# Patient Record
Sex: Female | Born: 1935 | Race: White | Hispanic: No | Smoking: Never smoker
Health system: Southern US, Community
[De-identification: ages and names within clinical notes are randomized; demographics above are authoritative.]

## PROBLEM LIST (undated history)

## (undated) DIAGNOSIS — C801 Malignant (primary) neoplasm, unspecified: Secondary | ICD-10-CM

## (undated) DIAGNOSIS — I10 Essential (primary) hypertension: Secondary | ICD-10-CM

## (undated) HISTORY — PX: ABDOMINAL HYSTERECTOMY: SHX81

---

## 2003-12-26 ENCOUNTER — Ambulatory Visit: Payer: Self-pay | Admitting: Pain Medicine

## 2004-01-03 ENCOUNTER — Ambulatory Visit: Payer: Self-pay | Admitting: Pain Medicine

## 2004-01-13 ENCOUNTER — Ambulatory Visit: Payer: Self-pay | Admitting: Pain Medicine

## 2004-02-13 ENCOUNTER — Ambulatory Visit: Payer: Self-pay | Admitting: Pain Medicine

## 2004-03-26 ENCOUNTER — Inpatient Hospital Stay: Payer: Self-pay | Admitting: Internal Medicine

## 2004-03-26 ENCOUNTER — Other Ambulatory Visit: Payer: Self-pay

## 2004-05-18 ENCOUNTER — Encounter: Admission: RE | Admit: 2004-05-18 | Discharge: 2004-05-18 | Payer: Self-pay | Admitting: *Deleted

## 2004-08-06 ENCOUNTER — Ambulatory Visit: Payer: Self-pay | Admitting: Pain Medicine

## 2004-08-12 ENCOUNTER — Ambulatory Visit: Payer: Self-pay | Admitting: Pain Medicine

## 2005-12-29 ENCOUNTER — Ambulatory Visit: Payer: Self-pay | Admitting: Pain Medicine

## 2006-01-03 ENCOUNTER — Ambulatory Visit: Payer: Self-pay | Admitting: Pain Medicine

## 2006-01-04 ENCOUNTER — Ambulatory Visit: Payer: Self-pay | Admitting: Pain Medicine

## 2006-05-09 ENCOUNTER — Other Ambulatory Visit: Payer: Self-pay

## 2006-05-09 ENCOUNTER — Ambulatory Visit: Payer: Self-pay | Admitting: General Practice

## 2006-05-16 ENCOUNTER — Ambulatory Visit: Payer: Self-pay | Admitting: General Practice

## 2007-03-01 ENCOUNTER — Ambulatory Visit: Payer: Self-pay | Admitting: Unknown Physician Specialty

## 2007-11-20 ENCOUNTER — Other Ambulatory Visit: Payer: Self-pay

## 2007-11-20 ENCOUNTER — Ambulatory Visit: Payer: Self-pay | Admitting: General Practice

## 2007-12-04 ENCOUNTER — Inpatient Hospital Stay: Payer: Self-pay | Admitting: General Practice

## 2007-12-08 ENCOUNTER — Encounter: Payer: Self-pay | Admitting: Internal Medicine

## 2007-12-17 ENCOUNTER — Encounter: Payer: Self-pay | Admitting: Internal Medicine

## 2008-08-13 ENCOUNTER — Ambulatory Visit: Payer: Self-pay | Admitting: Unknown Physician Specialty

## 2008-08-14 ENCOUNTER — Ambulatory Visit: Payer: Self-pay | Admitting: Pain Medicine

## 2008-08-21 ENCOUNTER — Ambulatory Visit: Payer: Self-pay | Admitting: Pain Medicine

## 2008-09-03 ENCOUNTER — Ambulatory Visit: Payer: Self-pay | Admitting: Pain Medicine

## 2008-10-15 ENCOUNTER — Ambulatory Visit: Payer: Self-pay | Admitting: Pain Medicine

## 2008-10-23 ENCOUNTER — Ambulatory Visit: Payer: Self-pay | Admitting: Pain Medicine

## 2008-11-28 ENCOUNTER — Ambulatory Visit: Payer: Self-pay | Admitting: Pain Medicine

## 2008-12-02 ENCOUNTER — Ambulatory Visit: Payer: Self-pay | Admitting: Pain Medicine

## 2008-12-31 ENCOUNTER — Ambulatory Visit: Payer: Self-pay | Admitting: Pain Medicine

## 2009-02-11 ENCOUNTER — Ambulatory Visit: Payer: Self-pay | Admitting: Pain Medicine

## 2009-02-19 ENCOUNTER — Ambulatory Visit: Payer: Self-pay | Admitting: Pain Medicine

## 2009-03-27 ENCOUNTER — Ambulatory Visit: Payer: Self-pay | Admitting: Pain Medicine

## 2009-04-07 ENCOUNTER — Ambulatory Visit: Payer: Self-pay | Admitting: Pain Medicine

## 2009-06-10 ENCOUNTER — Ambulatory Visit: Payer: Self-pay | Admitting: Pain Medicine

## 2009-07-29 ENCOUNTER — Ambulatory Visit: Payer: Self-pay | Admitting: General Practice

## 2009-08-11 ENCOUNTER — Inpatient Hospital Stay: Payer: Self-pay | Admitting: General Practice

## 2009-09-15 ENCOUNTER — Ambulatory Visit: Payer: Self-pay | Admitting: Unknown Physician Specialty

## 2009-12-03 ENCOUNTER — Ambulatory Visit: Payer: Self-pay | Admitting: Pain Medicine

## 2009-12-08 ENCOUNTER — Ambulatory Visit: Payer: Self-pay | Admitting: Pain Medicine

## 2009-12-12 ENCOUNTER — Ambulatory Visit: Payer: Self-pay | Admitting: Pain Medicine

## 2010-01-06 ENCOUNTER — Ambulatory Visit: Payer: Self-pay | Admitting: Pain Medicine

## 2010-01-12 ENCOUNTER — Ambulatory Visit: Payer: Self-pay | Admitting: Pain Medicine

## 2010-01-21 ENCOUNTER — Ambulatory Visit: Payer: Self-pay | Admitting: Pain Medicine

## 2010-02-12 ENCOUNTER — Ambulatory Visit: Payer: Self-pay | Admitting: Pain Medicine

## 2010-03-17 ENCOUNTER — Ambulatory Visit: Payer: Self-pay | Admitting: Pain Medicine

## 2010-04-16 ENCOUNTER — Ambulatory Visit: Payer: Self-pay | Admitting: Pain Medicine

## 2010-04-30 ENCOUNTER — Ambulatory Visit: Payer: Self-pay | Admitting: Family Medicine

## 2010-10-14 ENCOUNTER — Ambulatory Visit: Payer: Self-pay | Admitting: Unknown Physician Specialty

## 2011-10-19 ENCOUNTER — Ambulatory Visit: Payer: Self-pay | Admitting: Unknown Physician Specialty

## 2012-11-14 ENCOUNTER — Ambulatory Visit: Payer: Self-pay | Admitting: Ophthalmology

## 2012-12-26 ENCOUNTER — Ambulatory Visit: Payer: Self-pay | Admitting: Internal Medicine

## 2013-01-03 ENCOUNTER — Ambulatory Visit: Payer: Self-pay | Admitting: Internal Medicine

## 2013-08-02 DIAGNOSIS — E039 Hypothyroidism, unspecified: Secondary | ICD-10-CM | POA: Insufficient documentation

## 2013-09-07 DIAGNOSIS — M5136 Other intervertebral disc degeneration, lumbar region: Secondary | ICD-10-CM | POA: Insufficient documentation

## 2013-09-07 DIAGNOSIS — M5416 Radiculopathy, lumbar region: Secondary | ICD-10-CM | POA: Insufficient documentation

## 2013-12-27 ENCOUNTER — Ambulatory Visit: Payer: Self-pay | Admitting: Internal Medicine

## 2014-06-04 DIAGNOSIS — N183 Chronic kidney disease, stage 3 unspecified: Secondary | ICD-10-CM | POA: Insufficient documentation

## 2014-06-04 DIAGNOSIS — J3089 Other allergic rhinitis: Secondary | ICD-10-CM | POA: Insufficient documentation

## 2014-06-04 DIAGNOSIS — E782 Mixed hyperlipidemia: Secondary | ICD-10-CM | POA: Insufficient documentation

## 2014-06-04 DIAGNOSIS — I10 Essential (primary) hypertension: Secondary | ICD-10-CM | POA: Insufficient documentation

## 2014-08-14 ENCOUNTER — Other Ambulatory Visit: Payer: Self-pay | Admitting: Family Medicine

## 2014-08-14 ENCOUNTER — Ambulatory Visit
Admission: RE | Admit: 2014-08-14 | Discharge: 2014-08-14 | Disposition: A | Discharge status: Home / Self Care | Payer: Medicare Other | Source: Ambulatory Visit | Attending: Family Medicine | Admitting: Family Medicine

## 2014-08-14 ENCOUNTER — Ambulatory Visit
Admission: RE | Admit: 2014-08-14 | Discharge: 2014-08-14 | Disposition: A | Discharge status: Home / Self Care | Payer: Medicare Other | Source: Ambulatory Visit | Attending: Internal Medicine | Admitting: Internal Medicine

## 2014-08-14 DIAGNOSIS — R0602 Shortness of breath: Secondary | ICD-10-CM

## 2014-08-14 DIAGNOSIS — R059 Cough, unspecified: Secondary | ICD-10-CM

## 2014-08-14 DIAGNOSIS — K449 Diaphragmatic hernia without obstruction or gangrene: Secondary | ICD-10-CM | POA: Insufficient documentation

## 2014-08-14 DIAGNOSIS — R05 Cough: Secondary | ICD-10-CM | POA: Diagnosis present

## 2015-01-10 DIAGNOSIS — M5442 Lumbago with sciatica, left side: Secondary | ICD-10-CM

## 2015-01-10 DIAGNOSIS — M5441 Lumbago with sciatica, right side: Secondary | ICD-10-CM

## 2015-01-10 DIAGNOSIS — G8929 Other chronic pain: Secondary | ICD-10-CM | POA: Insufficient documentation

## 2015-06-11 DIAGNOSIS — M2550 Pain in unspecified joint: Secondary | ICD-10-CM | POA: Insufficient documentation

## 2015-06-11 DIAGNOSIS — N183 Chronic kidney disease, stage 3 unspecified: Secondary | ICD-10-CM | POA: Insufficient documentation

## 2015-06-11 DIAGNOSIS — M791 Myalgia, unspecified site: Secondary | ICD-10-CM | POA: Insufficient documentation

## 2015-08-04 DIAGNOSIS — M4800 Spinal stenosis, site unspecified: Secondary | ICD-10-CM | POA: Insufficient documentation

## 2016-03-17 ENCOUNTER — Emergency Department
Admission: EM | Admit: 2016-03-17 | Discharge: 2016-03-17 | Disposition: A | Discharge status: Home / Self Care | Payer: Medicare Other | Attending: Emergency Medicine | Admitting: Emergency Medicine

## 2016-03-17 ENCOUNTER — Emergency Department: Payer: Medicare Other

## 2016-03-17 ENCOUNTER — Encounter: Payer: Self-pay | Admitting: Emergency Medicine

## 2016-03-17 DIAGNOSIS — J351 Hypertrophy of tonsils: Secondary | ICD-10-CM | POA: Insufficient documentation

## 2016-03-17 DIAGNOSIS — I10 Essential (primary) hypertension: Secondary | ICD-10-CM | POA: Insufficient documentation

## 2016-03-17 DIAGNOSIS — R220 Localized swelling, mass and lump, head: Secondary | ICD-10-CM | POA: Diagnosis present

## 2016-03-17 HISTORY — DX: Malignant (primary) neoplasm, unspecified: C80.1

## 2016-03-17 HISTORY — DX: Essential (primary) hypertension: I10

## 2016-03-17 LAB — CBC
HCT: 45.4 % (ref 35.0–47.0)
Hemoglobin: 15.5 g/dL (ref 12.0–16.0)
MCH: 29.8 pg (ref 26.0–34.0)
MCHC: 34 g/dL (ref 32.0–36.0)
MCV: 87.6 fL (ref 80.0–100.0)
PLATELETS: 314 10*3/uL (ref 150–440)
RBC: 5.19 MIL/uL (ref 3.80–5.20)
RDW: 14.4 % (ref 11.5–14.5)
WBC: 9.2 10*3/uL (ref 3.6–11.0)

## 2016-03-17 LAB — COMPREHENSIVE METABOLIC PANEL
ALBUMIN: 4.5 g/dL (ref 3.5–5.0)
ALT: 16 U/L (ref 14–54)
ANION GAP: 12 (ref 5–15)
AST: 29 U/L (ref 15–41)
Alkaline Phosphatase: 53 U/L (ref 38–126)
BUN: 15 mg/dL (ref 6–20)
CHLORIDE: 102 mmol/L (ref 101–111)
CO2: 21 mmol/L — AB (ref 22–32)
Calcium: 9.9 mg/dL (ref 8.9–10.3)
Creatinine, Ser: 1.01 mg/dL — ABNORMAL HIGH (ref 0.44–1.00)
GFR calc non Af Amer: 50 mL/min — ABNORMAL LOW (ref >60)
GFR, EST AFRICAN AMERICAN: 58 mL/min — AB (ref >60)
GLUCOSE: 159 mg/dL — AB (ref 65–99)
Potassium: 4 mmol/L (ref 3.5–5.1)
SODIUM: 135 mmol/L (ref 135–145)
Total Bilirubin: 1 mg/dL (ref 0.3–1.2)
Total Protein: 7.9 g/dL (ref 6.5–8.1)

## 2016-03-17 MED ORDER — ONDANSETRON HCL 4 MG/2ML IJ SOLN
4.0000 mg | Freq: Once | INTRAMUSCULAR | Status: AC
  Administered 2016-03-17: 4 mg via INTRAVENOUS
  Filled 2016-03-17: qty 2

## 2016-03-17 MED ORDER — METHYLPREDNISOLONE SODIUM SUCC 125 MG IJ SOLR
125.0000 mg | Freq: Once | INTRAMUSCULAR | Status: AC
  Administered 2016-03-17: 125 mg via INTRAVENOUS
  Filled 2016-03-17: qty 2

## 2016-03-17 MED ORDER — DIPHENHYDRAMINE HCL 50 MG/ML IJ SOLN
25.0000 mg | Freq: Once | INTRAMUSCULAR | Status: AC
Start: 2016-03-17 — End: 2016-03-17
  Administered 2016-03-17: 25 mg via INTRAVENOUS
  Filled 2016-03-17: qty 1

## 2016-03-17 MED ORDER — LORAZEPAM 2 MG/ML IJ SOLN
0.5000 mg | Freq: Once | INTRAMUSCULAR | Status: AC
  Administered 2016-03-17: 0.5 mg via INTRAVENOUS

## 2016-03-17 MED ORDER — LORAZEPAM 2 MG/ML IJ SOLN
INTRAMUSCULAR | Status: AC
  Filled 2016-03-17: qty 1

## 2016-03-17 MED ORDER — IOPAMIDOL (ISOVUE-300) INJECTION 61%
75.0000 mL | Freq: Once | INTRAVENOUS | Status: AC | PRN
  Administered 2016-03-17: 75 mL via INTRAVENOUS

## 2016-03-17 NOTE — ED Notes (Signed)
Pt assisted to toilet to urinate 

## 2016-03-17 NOTE — ED Provider Notes (Signed)
Study Result   CLINICAL DATA:  Pain, tingling, and numbness of the lips for several days. Lip, tongue, and submandibular swelling.  EXAM: CT NECK WITH CONTRAST  TECHNIQUE: Multidetector CT imaging of the neck was performed using the standard protocol following the bolus administration of intravenous contrast.  CONTRAST:  90mL ISOVUE-300 IOPAMIDOL (ISOVUE-300) INJECTION 61%  COMPARISON:  None.  FINDINGS: Pharynx and larynx: Metallic dental streak artifact limits evaluation of the oral cavity, including much of the oral tongue as well as lips. No mass or significant swelling is seen in the floor of mouth. There is a 12 x 8 mm mildly nodular soft tissue focus at the right base of tongue projecting into the vallecula. This demonstrates similar enhancement compared to the contralateral lingual tonsil but is asymmetrically enlarged.  Salivary glands: No inflammation, mass, or stone.  Thyroid: Unremarkable.  Lymph nodes: No enlarged lymph nodes are identified in the neck.  Vascular: Major vascular structures of the neck appear patent. Mild-to-moderate calcified plaque is present in the aortic arch. There is a partially retropharyngeal course of the proximal right ICA.  Limited intracranial: Unremarkable.  Visualized orbits: Prior bilateral cataract extraction.  Mastoids and visualized paranasal sinuses: Clear.  Skeleton: Moderate cervical spondylosis. Diffuse cervical facet arthrosis, advanced on the left from C3-C6 with associated trace anterolisthesis at these levels.  Upper chest: Minimal pleuroparenchymal scarring in the lung apices.  Other: None.  IMPRESSION: 1. No acute abnormality identified in the neck. 2. 12 mm soft tissue focus in the right vallecula. This may reflect asymmetric lingual tonsil, however direct visualization is suggested to exclude neoplasm.   Electronically Signed   By: Logan Bores M.D.   On: 03/17/2016 08:22     Discussed with Dr. Richardson Landry he will follow-up in the office. Patient is aware that she has seen Dr. Kristine Garbe in the past she is very anxious and has a lot of stress in her life her husband recently went to the nursing home her daughter in law and believe actually has just been diagnosed with stomach cancer no bloody at home at present. She complains of tingling last night in her hands and feet and around her lips. This possibly could be due to tachypnea. She is not having anything right now except for some pain in the lips. Her lips appear to be normal.   Nena Polio, MD 03/17/16 1009

## 2016-03-17 NOTE — ED Notes (Signed)
Pt was assisted to the restroom and dressed for discharge.

## 2016-03-17 NOTE — ED Triage Notes (Signed)
Pt arrived via ems from home with complaints of nausea and diarrhea. Pt reports 1 episode of diarrhea. Pt denies any abdominal pain or chest pain. Pt also reports pain, tingling, and numbness of the lips for "several days." Pt able to communicate and answer questions appropriately. MD at bedside.

## 2016-03-17 NOTE — ED Notes (Signed)
Pt called her friend to pick her up.

## 2016-03-17 NOTE — Discharge Instructions (Signed)
Please call Dr. Richardson Landry or Dr. Oneida Alar and have them evaluate you. They should be ACCESSED the CAT scan report without any difficulty. That shows an enlarged tonsil with the base of her tongue. He checked. Please also follow-up with your regular family doctor. She can help with the tingling which are experiencing.

## 2016-03-17 NOTE — Care Management Note (Signed)
Case Management Note  Patient Details  Name: Alexandria Roman MRN: [id number] Date of Birth: 07-14-1929  Subjective/Objective:     Spoke to patient per MD. Provided her with the [facility]. Have highlighted the meals on wheels and community services page. She is alert and understands and says she will call when she gets home. Not sure how she is getting home.  I have spoken to her nurse, and let her know that we will provide a taxi voucher if the pt. Truly haas no way home, due to the cold.               Action/Plan:   Expected Discharge Date:                  Expected Discharge Plan:     In-House Referral:     Discharge planning Services     Post Acute Care Choice:    Choice offered to:     DME Arranged:    DME Agency:     HH Arranged:    HH Agency:     Status of Service:     If discussed at [employer] of Stay Meetings, dates discussed:    Additional Comments:  Beau Fanny, RN 03/17/2016, 10:29 AM

## 2016-03-17 NOTE — ED Provider Notes (Signed)
[facility] Emergency Department Provider Note   First MD Initiated Contact with Patient 03/17/16 0600     (approximate)  I have reviewed the triage vital signs and the nursing notes.   HISTORY  Chief Complaint Nausea    HPI Alexandria Roman is a 83 y.o. female presents with Lip and swelling times 1 week. Patient denies any difficulty swallowing or breathing. Patient denies any fever. Patient says that she was seen at urgent care for same complaint   Past Medical History:  Diagnosis Date  . Cancer ([place])   . Hypertension     There are no active problems to display for this patient.   Past Surgical History:  Procedure Laterality Date  . ABDOMINAL HYSTERECTOMY      Prior to Admission medications   Not on File    Allergies Codeine  No family history on file.  Social History Social History  Substance Use Topics  . Smoking status: Never Smoker  . Smokeless tobacco: Never Used  . Alcohol use No    Review of Systems Constitutional: No fever/chills Eyes: No visual changes. ENT: No sore throat. Cardiovascular: Denies chest pain. Respiratory: Denies shortness of breath. Gastrointestinal: No abdominal pain.  No nausea, no vomiting.  No diarrhea.  No constipation. Genitourinary: Negative for dysuria. Musculoskeletal: Negative for back pain. Skin: Negative for rash. Neurological: Negative for headaches, focal weakness or numbness.  10-point ROS otherwise negative.  ____________________________________________   PHYSICAL EXAM:  VITAL SIGNS: ED Triage Vitals  Enc Vitals Group     BP 03/17/16 0604 (!) 162/86     Pulse Rate 03/17/16 0604 92     Resp 03/17/16 0604 17     Temp 03/17/16 0604 98.6 F (37 C)     Temp Source 03/17/16 0604 Oral     SpO2 03/17/16 0604 100 %     Weight 03/17/16 0605 165 lb (74.8 kg)     Height 03/17/16 0605 5\' 4"  (1.626 m)     Head Circumference --      Peak Flow --      Pain Score --      Pain Loc --        Pain Edu? --      Excl. in [place]? --     Constitutional: Alert and oriented. Well appearing and in no acute distress. Eyes: Conjunctivae are normal. PERRL. EOMI. Head: Atraumatic. Ears:  Healthy appearing ear canals and TMs bilaterally Nose: No congestion/rhinnorhea. Mouth/Throat: Mucous membranes are moist.  Oropharynx non-erythematous. Neck: No stridor. Positive for submandibular swelling Cardiovascular: Normal rate, regular rhythm. Good peripheral circulation. Grossly normal heart sounds. Respiratory: Normal respiratory effort.  No retractions. Lungs CTAB. Gastrointestinal: Soft and nontender. No distention.  Musculoskeletal: No lower extremity tenderness nor edema. No gross deformities of extremities. Neurologic:  Normal speech and language. No gross focal neurologic deficits are appreciated.  Skin:  Skin is warm, dry and intact. No rash noted. Psychiatric: Mood and affect are normal. Speech and behavior are normal.  ____________________________________________   LABS (all labs ordered are listed, but only abnormal results are displayed)  Labs Reviewed  COMPREHENSIVE METABOLIC PANEL - Abnormal; Notable for the following:       Result Value   CO2 21 (*)    Glucose, Bld 159 (*)    Creatinine, Ser 1.01 (*)    GFR calc non Af Amer 50 (*)    GFR calc Af Amer 58 (*)    All other components within normal limits  CBC       Procedures   ___   INITIAL IMPRESSION / ASSESSMENT AND PLAN / ED COURSE  Pertinent labs & imaging results that were available during my care of the patient were reviewed by me and considered in my medical decision making (see chart for details).  83 year old female presenting with lip and tongue swelling. Patient received Solu-Medrol Benadryl on arrival. Patient's care is transferred to Dr. Cinda Quest CT scan of the neck pending.      ____________________________________________  FINAL CLINICAL IMPRESSION(S) / ED DIAGNOSES  Final diagnoses:   Enlarged tonsils     MEDICATIONS GIVEN DURING THIS VISIT:  Medications  methylPREDNISolone sodium succinate (SOLU-MEDROL) 125 mg/2 mL injection 125 mg (125 mg Intravenous Given 03/17/16 0622)  diphenhydrAMINE (BENADRYL) injection 25 mg (25 mg Intravenous Given 03/17/16 0623)  ondansetron (ZOFRAN) injection 4 mg (4 mg Intravenous Given 03/17/16 0621)  iopamidol (ISOVUE-300) 61 % injection 75 mL (75 mLs Intravenous Contrast Given 03/17/16 0656)  LORazepam (ATIVAN) injection 0.5 mg (0.5 mg Intravenous Given 03/17/16 0740)     NEW OUTPATIENT MEDICATIONS STARTED DURING THIS VISIT:  There are no discharge medications for this patient.   There are no discharge medications for this patient.   There are no discharge medications for this patient.    Note:  This document was prepared using Dragon voice recognition software and may include unintentional dictation errors.    Gregor Hams, MD 03/18/16 [phone]

## 2016-03-17 NOTE — ED Notes (Signed)
Pt taken to lobby via wheelchair to wait for her ride.

## 2016-03-19 ENCOUNTER — Encounter: Payer: Self-pay | Admitting: *Deleted

## 2016-03-19 ENCOUNTER — Observation Stay
Admission: EM | Admit: 2016-03-19 | Discharge: 2016-03-20 | Disposition: A | Discharge status: Home / Self Care | Payer: Medicare Other | Attending: Internal Medicine | Admitting: Internal Medicine

## 2016-03-19 ENCOUNTER — Emergency Department: Payer: Medicare Other

## 2016-03-19 ENCOUNTER — Observation Stay: Payer: Medicare Other

## 2016-03-19 ENCOUNTER — Observation Stay: Admit: 2016-03-19 | Payer: Medicare Other

## 2016-03-19 DIAGNOSIS — Z882 Allergy status to sulfonamides status: Secondary | ICD-10-CM | POA: Insufficient documentation

## 2016-03-19 DIAGNOSIS — R531 Weakness: Secondary | ICD-10-CM | POA: Diagnosis present

## 2016-03-19 DIAGNOSIS — F0390 Unspecified dementia without behavioral disturbance: Secondary | ICD-10-CM | POA: Diagnosis not present

## 2016-03-19 DIAGNOSIS — Z888 Allergy status to other drugs, medicaments and biological substances status: Secondary | ICD-10-CM | POA: Diagnosis not present

## 2016-03-19 DIAGNOSIS — Z79899 Other long term (current) drug therapy: Secondary | ICD-10-CM | POA: Diagnosis not present

## 2016-03-19 DIAGNOSIS — R4182 Altered mental status, unspecified: Secondary | ICD-10-CM | POA: Insufficient documentation

## 2016-03-19 DIAGNOSIS — I10 Essential (primary) hypertension: Secondary | ICD-10-CM | POA: Insufficient documentation

## 2016-03-19 DIAGNOSIS — R4781 Slurred speech: Secondary | ICD-10-CM

## 2016-03-19 DIAGNOSIS — K449 Diaphragmatic hernia without obstruction or gangrene: Secondary | ICD-10-CM | POA: Insufficient documentation

## 2016-03-19 DIAGNOSIS — Z881 Allergy status to other antibiotic agents status: Secondary | ICD-10-CM | POA: Diagnosis not present

## 2016-03-19 DIAGNOSIS — Z880 Allergy status to penicillin: Secondary | ICD-10-CM | POA: Diagnosis not present

## 2016-03-19 DIAGNOSIS — M47812 Spondylosis without myelopathy or radiculopathy, cervical region: Secondary | ICD-10-CM | POA: Insufficient documentation

## 2016-03-19 DIAGNOSIS — N390 Urinary tract infection, site not specified: Secondary | ICD-10-CM | POA: Diagnosis present

## 2016-03-19 DIAGNOSIS — Z885 Allergy status to narcotic agent status: Secondary | ICD-10-CM | POA: Insufficient documentation

## 2016-03-19 DIAGNOSIS — K219 Gastro-esophageal reflux disease without esophagitis: Secondary | ICD-10-CM | POA: Insufficient documentation

## 2016-03-19 DIAGNOSIS — R079 Chest pain, unspecified: Secondary | ICD-10-CM | POA: Diagnosis not present

## 2016-03-19 DIAGNOSIS — R262 Difficulty in walking, not elsewhere classified: Secondary | ICD-10-CM

## 2016-03-19 LAB — URINALYSIS, COMPLETE (UACMP) WITH MICROSCOPIC
BACTERIA UA: NONE SEEN
BILIRUBIN URINE: NEGATIVE
Glucose, UA: NEGATIVE mg/dL
Hgb urine dipstick: NEGATIVE
Ketones, ur: 5 mg/dL — AB
NITRITE: NEGATIVE
PH: 8 (ref 5.0–8.0)
Protein, ur: NEGATIVE mg/dL
Specific Gravity, Urine: 1.006 (ref 1.005–1.030)

## 2016-03-19 LAB — COMPREHENSIVE METABOLIC PANEL
ALT: 27 U/L (ref 14–54)
AST: 48 U/L — ABNORMAL HIGH (ref 15–41)
Albumin: 4.3 g/dL (ref 3.5–5.0)
Alkaline Phosphatase: 47 U/L (ref 38–126)
Anion gap: 9 (ref 5–15)
BUN: 16 mg/dL (ref 6–20)
CHLORIDE: 102 mmol/L (ref 101–111)
CO2: 23 mmol/L (ref 22–32)
Calcium: 9 mg/dL (ref 8.9–10.3)
Creatinine, Ser: 0.94 mg/dL (ref 0.44–1.00)
GFR, EST NON AFRICAN AMERICAN: 54 mL/min — AB (ref >60)
Glucose, Bld: 144 mg/dL — ABNORMAL HIGH (ref 65–99)
POTASSIUM: 3.5 mmol/L (ref 3.5–5.1)
Sodium: 134 mmol/L — ABNORMAL LOW (ref 135–145)
Total Bilirubin: 1 mg/dL (ref 0.3–1.2)
Total Protein: 7.3 g/dL (ref 6.5–8.1)

## 2016-03-19 LAB — CBC
HEMATOCRIT: 41.7 % (ref 35.0–47.0)
Hemoglobin: 14.3 g/dL (ref 12.0–16.0)
MCH: 29.6 pg (ref 26.0–34.0)
MCHC: 34.3 g/dL (ref 32.0–36.0)
MCV: 86.4 fL (ref 80.0–100.0)
Platelets: 279 10*3/uL (ref 150–440)
RBC: 4.83 MIL/uL (ref 3.80–5.20)
RDW: 14.7 % — ABNORMAL HIGH (ref 11.5–14.5)
WBC: 9.2 10*3/uL (ref 3.6–11.0)

## 2016-03-19 LAB — LIPID PANEL
CHOL/HDL RATIO: 3.2 ratio
CHOLESTEROL: 214 mg/dL — AB (ref 0–200)
HDL: 66 mg/dL (ref >40)
LDL Cholesterol: 115 mg/dL — ABNORMAL HIGH (ref 0–99)
Triglycerides: 167 mg/dL — ABNORMAL HIGH (ref <150)
VLDL: 33 mg/dL (ref 0–40)

## 2016-03-19 LAB — TROPONIN I
Troponin I: <0.03 ng/mL (ref <0.03)
Troponin I: <0.03 ng/mL (ref <0.03)
Troponin I: 0.03 ng/mL (ref <0.03)

## 2016-03-19 LAB — TSH: TSH: 2.181 u[IU]/mL (ref 0.350–4.500)

## 2016-03-19 MED ORDER — NITROGLYCERIN 0.4 MG SL SUBL: 0.4000 mg | SUBLINGUAL_TABLET | SUBLINGUAL | Status: DC | PRN

## 2016-03-19 MED ORDER — PANTOPRAZOLE SODIUM 40 MG PO TBEC
40.0000 mg | DELAYED_RELEASE_TABLET | Freq: Two times a day (BID) | ORAL | Status: DC
  Administered 2016-03-19 – 2016-03-20 (×2): 40 mg via ORAL
  Filled 2016-03-19 (×2): qty 1

## 2016-03-19 MED ORDER — ONDANSETRON HCL 4 MG/2ML IJ SOLN
4.0000 mg | INTRAMUSCULAR | Status: DC | PRN
  Administered 2016-03-19 (×2): 4 mg via INTRAVENOUS
  Filled 2016-03-19 (×3): qty 2

## 2016-03-19 MED ORDER — IRBESARTAN 75 MG PO TABS
37.5000 mg | ORAL_TABLET | Freq: Every day | ORAL | Status: DC
  Administered 2016-03-19 – 2016-03-20 (×2): 37.5 mg via ORAL
  Filled 2016-03-19 (×2): qty 1

## 2016-03-19 MED ORDER — CALCIUM CARBONATE-VITAMIN D 500-200 MG-UNIT PO TABS
1.0000 | ORAL_TABLET | Freq: Two times a day (BID) | ORAL | Status: DC
  Administered 2016-03-19 – 2016-03-20 (×2): 1 via ORAL
  Filled 2016-03-19 (×2): qty 1

## 2016-03-19 MED ORDER — ASPIRIN EC 81 MG PO TBEC: 81.0000 mg | DELAYED_RELEASE_TABLET | Freq: Every day | ORAL | Status: DC

## 2016-03-19 MED ORDER — ENSURE ENLIVE PO LIQD
237.0000 mL | Freq: Every day | ORAL | Status: DC
  Administered 2016-03-19 – 2016-03-20 (×2): 237 mL via ORAL

## 2016-03-19 MED ORDER — ONDANSETRON HCL 4 MG/2ML IJ SOLN
INTRAMUSCULAR | Status: AC
  Administered 2016-03-19: 4 mg via INTRAVENOUS
  Filled 2016-03-19: qty 2

## 2016-03-19 MED ORDER — ZOLPIDEM TARTRATE 5 MG PO TABS
5.0000 mg | ORAL_TABLET | Freq: Every evening | ORAL | Status: DC | PRN
  Administered 2016-03-19: 5 mg via ORAL
  Filled 2016-03-19: qty 1

## 2016-03-19 MED ORDER — OLOPATADINE HCL 0.1 % OP SOLN
1.0000 [drp] | Freq: Two times a day (BID) | OPHTHALMIC | Status: DC
Start: 2016-03-19 — End: 2016-03-20
  Administered 2016-03-19 – 2016-03-20 (×2): 1 [drp] via OPHTHALMIC
  Filled 2016-03-19: qty 5

## 2016-03-19 MED ORDER — LORAZEPAM 2 MG PO TABS
2.0000 mg | ORAL_TABLET | Freq: Two times a day (BID) | ORAL | Status: DC
  Administered 2016-03-19 (×2): 2 mg via ORAL
  Filled 2016-03-19 (×2): qty 1

## 2016-03-19 MED ORDER — HYDRALAZINE HCL 20 MG/ML IJ SOLN: 10.0000 mg | INTRAMUSCULAR | Status: DC | PRN

## 2016-03-19 MED ORDER — ENOXAPARIN SODIUM 40 MG/0.4ML ~~LOC~~ SOLN
40.0000 mg | SUBCUTANEOUS | Status: DC
  Administered 2016-03-19: 40 mg via SUBCUTANEOUS
  Filled 2016-03-19: qty 0.4

## 2016-03-19 MED ORDER — OCUVITE-LUTEIN PO CAPS
1.0000 | ORAL_CAPSULE | Freq: Two times a day (BID) | ORAL | Status: DC
  Administered 2016-03-19 – 2016-03-20 (×2): 1 via ORAL
  Filled 2016-03-19 (×2): qty 1

## 2016-03-19 MED ORDER — DULOXETINE HCL 60 MG PO CPEP
90.0000 mg | ORAL_CAPSULE | Freq: Every day | ORAL | Status: DC
  Administered 2016-03-19 – 2016-03-20 (×2): 90 mg via ORAL
  Filled 2016-03-19 (×2): qty 1

## 2016-03-19 MED ORDER — NITROFURANTOIN MONOHYD MACRO 100 MG PO CAPS: 100.0000 mg | ORAL_CAPSULE | Freq: Two times a day (BID) | ORAL | 0 refills | Status: DC

## 2016-03-19 MED ORDER — FAMOTIDINE 20 MG PO TABS: 10.0000 mg | ORAL_TABLET | Freq: Every day | ORAL | Status: DC

## 2016-03-19 MED ORDER — NITROGLYCERIN 2 % TD OINT
0.5000 [in_us] | TOPICAL_OINTMENT | Freq: Once | TRANSDERMAL | Status: AC
  Administered 2016-03-19: 0.5 [in_us] via TOPICAL
  Filled 2016-03-19: qty 1

## 2016-03-19 MED ORDER — NITROFURANTOIN MONOHYD MACRO 100 MG PO CAPS
100.0000 mg | ORAL_CAPSULE | Freq: Two times a day (BID) | ORAL | Status: DC
  Administered 2016-03-19 – 2016-03-20 (×2): 100 mg via ORAL
  Filled 2016-03-19 (×3): qty 1

## 2016-03-19 MED ORDER — DOXYCYCLINE HYCLATE 100 MG PO TABS
100.0000 mg | ORAL_TABLET | Freq: Once | ORAL | Status: AC
  Administered 2016-03-19: 100 mg via ORAL
  Filled 2016-03-19: qty 1

## 2016-03-19 MED ORDER — OXYBUTYNIN CHLORIDE ER 5 MG PO TB24
5.0000 mg | ORAL_TABLET | Freq: Every day | ORAL | Status: DC
  Administered 2016-03-19: 5 mg via ORAL
  Filled 2016-03-19: qty 1

## 2016-03-19 MED ORDER — PRAVASTATIN SODIUM 40 MG PO TABS
40.0000 mg | ORAL_TABLET | Freq: Every day | ORAL | Status: DC
  Administered 2016-03-19 – 2016-03-20 (×2): 40 mg via ORAL
  Filled 2016-03-19 (×2): qty 1

## 2016-03-19 MED ORDER — GABAPENTIN 100 MG PO CAPS
100.0000 mg | ORAL_CAPSULE | Freq: Every day | ORAL | Status: DC
  Administered 2016-03-19: 100 mg via ORAL
  Filled 2016-03-19: qty 1

## 2016-03-19 MED ORDER — ASPIRIN EC 81 MG PO TBEC
81.0000 mg | DELAYED_RELEASE_TABLET | Freq: Every day | ORAL | Status: DC
  Administered 2016-03-20: 81 mg via ORAL
  Filled 2016-03-19: qty 1

## 2016-03-19 MED ORDER — ONDANSETRON HCL 4 MG/2ML IJ SOLN
4.0000 mg | Freq: Once | INTRAMUSCULAR | Status: AC
  Administered 2016-03-19: 4 mg via INTRAVENOUS

## 2016-03-19 MED ORDER — ASPIRIN 81 MG PO CHEW
324.0000 mg | CHEWABLE_TABLET | Freq: Once | ORAL | Status: AC
  Administered 2016-03-19: 324 mg via ORAL
  Filled 2016-03-19: qty 4

## 2016-03-19 MED ORDER — PANTOPRAZOLE SODIUM 40 MG PO TBEC: 40.0000 mg | DELAYED_RELEASE_TABLET | Freq: Two times a day (BID) | ORAL | 0 refills | Status: DC

## 2016-03-19 MED ORDER — LEVOTHYROXINE SODIUM 50 MCG PO TABS
50.0000 ug | ORAL_TABLET | Freq: Every day | ORAL | Status: DC
  Administered 2016-03-19 – 2016-03-20 (×2): 50 ug via ORAL
  Filled 2016-03-19 (×2): qty 1

## 2016-03-19 MED ORDER — OMEGA-3-ACID ETHYL ESTERS 1 G PO CAPS
1.0000 g | ORAL_CAPSULE | Freq: Every day | ORAL | Status: DC
  Administered 2016-03-20: 1 g via ORAL
  Filled 2016-03-19: qty 1

## 2016-03-19 MED ORDER — ONDANSETRON HCL 4 MG/2ML IJ SOLN
4.0000 mg | Freq: Four times a day (QID) | INTRAMUSCULAR | Status: DC | PRN
  Administered 2016-03-19: 4 mg via INTRAVENOUS
  Filled 2016-03-19: qty 2

## 2016-03-19 MED ORDER — ACETAMINOPHEN 325 MG PO TABS: 650.0000 mg | ORAL_TABLET | ORAL | Status: DC | PRN

## 2016-03-19 MED ORDER — ONDANSETRON 4 MG PO TBDP: 4.0000 mg | ORAL_TABLET | Freq: Three times a day (TID) | ORAL | 0 refills | Status: DC | PRN

## 2016-03-19 MED ORDER — DOCUSATE SODIUM 100 MG PO CAPS
100.0000 mg | ORAL_CAPSULE | Freq: Two times a day (BID) | ORAL | Status: DC
  Administered 2016-03-19 – 2016-03-20 (×2): 100 mg via ORAL
  Filled 2016-03-19 (×2): qty 1

## 2016-03-19 MED ORDER — FAMOTIDINE IN NACL 20-0.9 MG/50ML-% IV SOLN
20.0000 mg | Freq: Once | INTRAVENOUS | Status: AC
  Administered 2016-03-19: 20 mg via INTRAVENOUS
  Filled 2016-03-19: qty 50

## 2016-03-19 NOTE — ED Notes (Signed)
Patient transported to CT 

## 2016-03-19 NOTE — ED Provider Notes (Signed)
[facility] Emergency Department Provider Note   ____________________________________________   First MD Initiated Contact with Patient 03/19/16 0214     (approximate)  I have reviewed the triage vital signs and the nursing notes.   HISTORY  Chief Complaint Oral Swelling and Altered Mental Status    HPI Alexandria Roman is a 83 y.o. female brought to the ED from home via EMS with a chief complaint of nausea, "feeling sick", sensation of lips tingling, altered mental status, chest pain and weakness. Patient reportsnausea and sensation of lips tingling 5 days ago. She was evaluated in the ED on 1/31, found to have a suspicious nodule on CT neck which was to be evaluated by ENT on an outpatient basis. EMS reports she called 911 twice earlier this evening for a sick call. Patient lives alone; her husband was recently moved to a nursing home, and her son does not live in the area. Patient has been relying on her broker and his wife for assistance. Reports intermittent chest pain which she describes as tightness. This is associated with a sensation of nausea and also burning in her chest. States she felt confused and disoriented today; reports being too weak to ambulate and feeling off balance. States 5 days ago she looked in the mirror and appreciated facial droop which has resolved since that time. Denies recent fever, chills, shortness of breath, abdominal pain, vomiting, diarrhea. Reports feeling like she needs to urinate but cannot. Denies recent travel or trauma. Nothing makes her symptoms better or worse.   Past Medical History:  Diagnosis Date  . Cancer ([place])   . Hypertension     Patient Active Problem List   Diagnosis Date Noted  . Chest pain 03/19/2016    Past Surgical History:  Procedure Laterality Date  . ABDOMINAL HYSTERECTOMY      Prior to Admission medications   Medication Sig Start Date End Date Taking? Authorizing Provider  calcium-vitamin  D (OSCAL WITH D) 500-200 MG-UNIT tablet Take 1 tablet by mouth 2 (two) times daily.   Yes Historical Provider, MD  docusate sodium (COLACE) 100 MG capsule Take 100 mg by mouth 2 (two) times daily.   Yes Historical Provider, MD  DULoxetine (CYMBALTA) 30 MG capsule Take 90 mg by mouth daily.   Yes Historical Provider, MD  econazole nitrate 1 % cream Apply 1 application topically daily.   Yes Historical Provider, MD  gabapentin (NEURONTIN) 100 MG capsule Take 100 mg by mouth at bedtime.   Yes Historical Provider, MD  levothyroxine (SYNTHROID, LEVOTHROID) 50 MCG tablet Take 50 mcg by mouth daily before breakfast.   Yes Historical Provider, MD  LORazepam (ATIVAN) 2 MG tablet Take 2 mg by mouth 2 (two) times daily.   Yes Historical Provider, MD  Multiple Vitamins-Minerals (PRESERVISION/LUTEIN) CAPS Take 1 capsule by mouth 2 (two) times daily.   Yes Historical Provider, MD  olopatadine (PATANOL) 0.1 % ophthalmic solution 1 drop 2 (two) times daily.   Yes Historical Provider, MD  omega-3 acid ethyl esters (LOVAZA) 1 g capsule Take 1 g by mouth daily.   Yes Historical Provider, MD  oxybutynin (DITROPAN-XL) 5 MG 24 hr tablet Take 5 mg by mouth at bedtime.   Yes Historical Provider, MD  pravastatin (PRAVACHOL) 40 MG tablet Take 40 mg by mouth daily.   Yes Historical Provider, MD  ranitidine (ZANTAC) 150 MG tablet Take 150 mg by mouth every evening.   Yes Historical Provider, MD  valsartan (DIOVAN) 160 MG tablet  Take 160 mg by mouth daily.   Yes Historical Provider, MD  zolpidem (AMBIEN) 5 MG tablet Take 5 mg by mouth at bedtime as needed for sleep.   Yes Historical Provider, MD    Allergies Amoxicillin-pot clavulanate; Celecoxib; Cephalexin; Codeine; Cyclobenzaprine; Hydrochlorothiazide; Hydrocodone-chlorpheniramine; Hydrocodone-homatropine; Metronidazole; Moxifloxacin; Oxycodone-acetaminophen; Penicillin v potassium; Prednisone; Simvastatin; Sulfa antibiotics; and Tramadol  Family History  Problem  Relation Age of Onset  . Testicular cancer Father     Social History Social History  Substance Use Topics  . Smoking status: Never Smoker  . Smokeless tobacco: Never Used  . Alcohol use No    Review of Systems  Constitutional: No fever/chills. Eyes: No visual changes. ENT: No sore throat. Cardiovascular: Positive for chest pain. Respiratory: Denies shortness of breath. Gastrointestinal: No abdominal pain. Positive for nausea, no vomiting.  No diarrhea.  No constipation. Genitourinary: Positive for urgency. Negative for dysuria. Musculoskeletal: Negative for back pain. Skin: Negative for rash. Neurological: Negative for headaches, focal weakness or numbness.  10-point ROS otherwise negative.  ____________________________________________   PHYSICAL EXAM:  VITAL SIGNS: ED Triage Vitals  Enc Vitals Group     BP 03/19/16 0214 (!) 175/100     Pulse Rate 03/19/16 0214 89     Resp 03/19/16 0214 16     Temp 03/19/16 0214 97.8 F (36.6 C)     Temp Source 03/19/16 0214 Oral     SpO2 03/19/16 0214 97 %     Weight 03/19/16 0216 165 lb (74.8 kg)     Height 03/19/16 0216 '5\' 4"'$  (1.626 m)     Head Circumference --      Peak Flow --      Pain Score --      Pain Loc --      Pain Edu? --      Excl. in [place]? --     Constitutional: Alert and oriented. Frail appearing and in no acute distress. Anxious. Eyes: Conjunctivae are normal. PERRL. EOMI. Head: Atraumatic. Nose: No congestion/rhinnorhea. Mouth/Throat: There is no appreciable intraoral or extraoral swelling. There is no angioedema or tongue swelling. Speech is clear. There is no hoarse or muffled voice. There is no drooling. Mucous membranes are moist.  Oropharynx non-erythematous. Neck: No stridor. No carotid bruits. Cardiovascular: Normal rate, regular rhythm. Grossly normal heart sounds.  Good peripheral circulation. Respiratory: Normal respiratory effort.  No retractions. Lungs CTAB. Gastrointestinal: Soft and nontender.  No distention. No abdominal bruits. No CVA tenderness. Musculoskeletal: No lower extremity tenderness nor edema.  No joint effusions. Neurologic:  Alert and oriented to person and place.  CN II-XII grossly intact. Normal speech and language. No gross focal neurologic deficits are appreciated. NIH stroke scale is 0. Skin:  Skin is warm, dry and intact. No rash noted. Psychiatric: Mood and affect are anxious. Speech and behavior are normal.  ____________________________________________   LABS (all labs ordered are listed, but only abnormal results are displayed)  Labs Reviewed  COMPREHENSIVE METABOLIC PANEL - Abnormal; Notable for the following:       Result Value   Sodium 134 (*)    Glucose, Bld 144 (*)    AST 48 (*)    GFR calc non Af Amer 54 (*)    All other components within normal limits  CBC - Abnormal; Notable for the following:    RDW 14.7 (*)    All other components within normal limits  URINALYSIS, COMPLETE (UACMP) WITH MICROSCOPIC - Abnormal; Notable for the following:    Color, Urine STRAW (*)  APPearance CLEAR (*)    Ketones, ur 5 (*)    Leukocytes, UA MODERATE (*)    Squamous Epithelial / LPF 0-5 (*)    All other components within normal limits  TROPONIN I  CBG MONITORING, ED   ____________________________________________  EKG  ED ECG REPORT I, SUNG,JADE J, the attending physician, personally viewed and interpreted this ECG.   Date: 03/19/2016  EKG Time: 0222  Rate: 87  Rhythm: normal EKG, normal sinus rhythm  Axis: Normal  Intervals:none  ST&T Change: Nonspecific  ____________________________________________  RADIOLOGY  Portable chest x-ray interpreted per Dr. Alroy Dust: Large hiatal hernia. No acute cardiopulmonary findings.  CT head interpreted per Dr. Alroy Dust: No acute intracranial findings. There is mild generalized atrophy  and chronic appearing white matter hypodensities which likely  represent small vessel ischemic disease.    ____________________________________________   PROCEDURES  Procedure(s) performed: None  Procedures  Critical Care performed: No  ____________________________________________   INITIAL IMPRESSION / ASSESSMENT AND PLAN / ED COURSE  Pertinent labs & imaging results that were available during my care of the patient were reviewed by me and considered in my medical decision making (see chart for details).  83 year old female who presents with a myriad of medical complaints; most concerning is strokelike symptoms (out of the time window for TPA) as well as chest pain. Initial EKG and troponin are negative. CT head is negative for intracranial hemorrhage. Will administer aspirin, Pepcid for symptomatic relief of hiatal hernia, nitroglycerin paste for chest pain. Will discuss with hospitalist to evaluate patient in the emergency department for admission. Patient will require case management services for rehabilitation or placement.      ____________________________________________   FINAL CLINICAL IMPRESSION(S) / ED DIAGNOSES  Final diagnoses:  Altered mental status, unspecified altered mental status type  Weakness  Chest pain, unspecified type  Lower urinary tract infectious disease      NEW MEDICATIONS STARTED DURING THIS VISIT:  New Prescriptions   No medications on file     Note:  This document was prepared using Dragon voice recognition software and may include unintentional dictation errors.    Paulette Blanch, MD 03/19/16 [phone]

## 2016-03-19 NOTE — H&P (Signed)
[facility] NAME: Alexandria Roman    MR#:  [id number]  DATE OF BIRTH:  16-Jan-1930  DATE OF ADMISSION:  03/19/2016  PRIMARY CARE PHYSICIAN: Glendon Axe, MD   REQUESTING/REFERRING PHYSICIAN:   CHIEF COMPLAINT:   Chief Complaint  Patient presents with  . Oral Swelling  . Altered Mental Status    HISTORY OF PRESENT ILLNESS: Alexandria Roman  is a 83 y.o. female with a known history of Hypertension presented to the emergency room with chest discomfort and weakness. Patient called EMS 3 times today telling them that she is feeling sick and was finally brought to the emergency room. Recently patient's husband has been moved to [facility] facility. Patient lives alone at home and unable to take care of herself. Her son lives in [place]. Today the emergency room patient complained of chest discomfort located in the retrosternal area. Pain is aching in nature 4 out of 10 on a scale of 1-10. She was also diagnosed recently with a nodule over the vallecula for which she has ENT follow-up as outpatient. Patient was little confused when she presented to the emergency room but later on her mental status improved and she is oriented to time place and person. No history of fall or head injury. Has increased urinary frequency. No fever or chills. Workup in the emergency room with CT head showed no acute intracranial abnormality. Urinalysis showed infection. First set of troponin was negative. No complaints of shortness of breath and orthopnea. Hospitalist service was consulted for further care of the patient.  PAST MEDICAL HISTORY:   Past Medical History:  Diagnosis Date  . Cancer ([place])   . Hypertension     PAST SURGICAL HISTORY: Past Surgical History:  Procedure Laterality Date  . ABDOMINAL HYSTERECTOMY      SOCIAL HISTORY:  Social History  Substance Use Topics  . Smoking status: Never Smoker  . Smokeless tobacco: Never Used  .  Alcohol use No    FAMILY HISTORY:  Family History  Problem Relation Age of Onset  . Testicular cancer Father     DRUG ALLERGIES:  Allergies  Allergen Reactions  . Amoxicillin-Pot Clavulanate Diarrhea  . Celecoxib Nausea And Vomiting  . Cephalexin Other (See Comments)  . Codeine Nausea Only  . Cyclobenzaprine Other (See Comments) and Nausea Only  . Hydrochlorothiazide Other (See Comments)  . Hydrocodone-Chlorpheniramine Other (See Comments)  . Hydrocodone-Homatropine Other (See Comments)  . Metronidazole Other (See Comments)    Other Reaction: Not Assessed  . Moxifloxacin Other (See Comments)  . Oxycodone-Acetaminophen Nausea Only and Nausea And Vomiting  . Penicillin V Potassium     Other reaction(s): Unknown  . Prednisone Other (See Comments)  . Simvastatin Other (See Comments)  . Sulfa Antibiotics Other (See Comments)  . Tramadol Other (See Comments)    REVIEW OF SYSTEMS:   CONSTITUTIONAL: No fever, has weakness.  EYES: No blurred or double vision.  EARS, NOSE, AND THROAT: No tinnitus or ear pain.  RESPIRATORY: No cough, shortness of breath, wheezing or hemoptysis.  CARDIOVASCULAR: Has chest pain, no orthopnea, edema.  GASTROINTESTINAL: No nausea, vomiting, diarrhea or abdominal pain.  GENITOURINARY: Has dysuria, no hematuria.  ENDOCRINE: No polyuria, nocturia,  HEMATOLOGY: No anemia, easy bruising or bleeding SKIN: No rash or lesion. MUSCULOSKELETAL: No joint pain or arthritis.   NEUROLOGIC: No tingling, numbness, weakness.  PSYCHIATRY: No anxiety or depression.   MEDICATIONS AT HOME:  Prior to Admission medications   Medication  Sig Start Date End Date Taking? Authorizing Provider  calcium-vitamin D (OSCAL WITH D) 500-200 MG-UNIT tablet Take 1 tablet by mouth 2 (two) times daily.   Yes Historical Provider, MD  docusate sodium (COLACE) 100 MG capsule Take 100 mg by mouth 2 (two) times daily.   Yes Historical Provider, MD  DULoxetine (CYMBALTA) 30 MG capsule Take  90 mg by mouth daily.   Yes Historical Provider, MD  econazole nitrate 1 % cream Apply 1 application topically daily.   Yes Historical Provider, MD  gabapentin (NEURONTIN) 100 MG capsule Take 100 mg by mouth at bedtime.   Yes Historical Provider, MD  levothyroxine (SYNTHROID, LEVOTHROID) 50 MCG tablet Take 50 mcg by mouth daily before breakfast.   Yes Historical Provider, MD  LORazepam (ATIVAN) 2 MG tablet Take 2 mg by mouth 2 (two) times daily.   Yes Historical Provider, MD  Multiple Vitamins-Minerals (PRESERVISION/LUTEIN) CAPS Take 1 capsule by mouth 2 (two) times daily.   Yes Historical Provider, MD  olopatadine (PATANOL) 0.1 % ophthalmic solution 1 drop 2 (two) times daily.   Yes Historical Provider, MD  omega-3 acid ethyl esters (LOVAZA) 1 g capsule Take 1 g by mouth daily.   Yes Historical Provider, MD  oxybutynin (DITROPAN-XL) 5 MG 24 hr tablet Take 5 mg by mouth at bedtime.   Yes Historical Provider, MD  pravastatin (PRAVACHOL) 40 MG tablet Take 40 mg by mouth daily.   Yes Historical Provider, MD  ranitidine (ZANTAC) 150 MG tablet Take 150 mg by mouth every evening.   Yes Historical Provider, MD  valsartan (DIOVAN) 160 MG tablet Take 160 mg by mouth daily.   Yes Historical Provider, MD  zolpidem (AMBIEN) 5 MG tablet Take 5 mg by mouth at bedtime as needed for sleep.   Yes Historical Provider, MD      PHYSICAL EXAMINATION:   VITAL SIGNS: Blood pressure (!) 175/100, pulse 89, temperature 97.8 F (36.6 C), temperature source Oral, resp. rate 16, height 5\' 4"  (1.626 m), weight 74.8 kg (165 lb), SpO2 97 %.  GENERAL:  83 y.o.-year-old elderly female patient lying in the bed with no acute distress.  EYES: Pupils equal, round, reactive to light and accommodation. No scleral icterus. Extraocular muscles intact.  HEENT: Head atraumatic, normocephalic. Oropharynx and nasopharynx clear.  NECK:  Supple, no jugular venous distention. No thyroid enlargement, no tenderness.  LUNGS: Normal breath  sounds bilaterally, no wheezing, rales,rhonchi or crepitation. No use of accessory muscles of respiration.  CARDIOVASCULAR: S1, S2 normal. No murmurs, rubs, or gallops.  ABDOMEN: Soft, nontender, nondistended. Bowel sounds present. No organomegaly or mass.  EXTREMITIES: No pedal edema, cyanosis, or clubbing.  NEUROLOGIC: Cranial nerves II through XII are intact. Muscle strength 5/5 in all extremities. Sensation intact. Gait not checked.  PSYCHIATRIC: The patient is alert and oriented x 3.  SKIN: No obvious rash, lesion, or ulcer.   LABORATORY PANEL:   CBC  Recent Labs Lab 03/17/16 0620 03/19/16 0219  WBC 9.2 9.2  HGB 15.5 14.3  HCT 45.4 41.7  PLT 314 279  MCV 87.6 86.4  MCH 29.8 29.6  MCHC 34.0 34.3  RDW 14.4 14.7*   ------------------------------------------------------------------------------------------------------------------  Chemistries   Recent Labs Lab 03/17/16 0620 03/19/16 0219  NA 135 134*  K 4.0 3.5  CL 102 102  CO2 21* 23  GLUCOSE 159* 144*  BUN 15 16  CREATININE 1.01* 0.94  CALCIUM 9.9 9.0  AST 29 48*  ALT 16 27  ALKPHOS 53 47  BILITOT 1.0  1.0   ------------------------------------------------------------------------------------------------------------------ estimated creatinine clearance is 44.1 mL/min (by C-G formula based on SCr of 0.94 mg/dL). ------------------------------------------------------------------------------------------------------------------ No results for input(s): TSH, T4TOTAL, T3FREE, THYROIDAB in the last 72 hours.  Invalid input(s): FREET3   Coagulation profile No results for input(s): INR, PROTIME in the last 168 hours. ------------------------------------------------------------------------------------------------------------------- No results for input(s): DDIMER in the last 72 hours. -------------------------------------------------------------------------------------------------------------------  Cardiac  Enzymes  Recent Labs Lab 03/19/16 0219  TROPONINI <0.03   ------------------------------------------------------------------------------------------------------------------ Invalid input(s): POCBNP  ---------------------------------------------------------------------------------------------------------------  Urinalysis    Component Value Date/Time   COLORURINE STRAW (A) 03/19/2016 0219   APPEARANCEUR CLEAR (A) 03/19/2016 0219   LABSPEC 1.006 03/19/2016 0219   PHURINE 8.0 03/19/2016 0219   GLUCOSEU NEGATIVE 03/19/2016 0219   HGBUR NEGATIVE 03/19/2016 0219   BILIRUBINUR NEGATIVE 03/19/2016 0219   KETONESUR 5 (A) 03/19/2016 0219   PROTEINUR NEGATIVE 03/19/2016 0219   NITRITE NEGATIVE 03/19/2016 0219   LEUKOCYTESUR MODERATE (A) 03/19/2016 0219     RADIOLOGY: Ct Head Wo Contrast  Result Date: 03/19/2016 CLINICAL DATA:  Weakness and central chest pain. Nausea. One week duration. EXAM: CT HEAD WITHOUT CONTRAST TECHNIQUE: Contiguous axial images were obtained from the base of the skull through the vertex without intravenous contrast. COMPARISON:  01/03/2013 FINDINGS: Brain: There is no intracranial hemorrhage, mass or evidence of acute infarction. There is mild generalized atrophy. There is mild chronic microvascular ischemic change. There is no significant extra-axial fluid collection. No acute intracranial findings are evident. Vascular: No hyperdense vessel or unexpected calcification. Skull: Normal. Negative for fracture or focal lesion. Sinuses/Orbits: No acute finding. Other: None. IMPRESSION: No acute intracranial findings. There is mild generalized atrophy and chronic appearing white matter hypodensities which likely represent small vessel ischemic disease. Electronically Signed   By: Andreas Newport M.D.   On: 03/19/2016 02:41   Ct Soft Tissue Neck W Contrast  Result Date: 03/17/2016 CLINICAL DATA:  Pain, tingling, and numbness of the lips for several days. Lip, tongue, and  submandibular swelling. EXAM: CT NECK WITH CONTRAST TECHNIQUE: Multidetector CT imaging of the neck was performed using the standard protocol following the bolus administration of intravenous contrast. CONTRAST:  42mL ISOVUE-300 IOPAMIDOL (ISOVUE-300) INJECTION 61% COMPARISON:  None. FINDINGS: Pharynx and larynx: Metallic dental streak artifact limits evaluation of the oral cavity, including much of the oral tongue as well as lips. No mass or significant swelling is seen in the floor of mouth. There is a 12 x 8 mm mildly nodular soft tissue focus at the right base of tongue projecting into the vallecula. This demonstrates similar enhancement compared to the contralateral lingual tonsil but is asymmetrically enlarged. Salivary glands: No inflammation, mass, or stone. Thyroid: Unremarkable. Lymph nodes: No enlarged lymph nodes are identified in the neck. Vascular: Major vascular structures of the neck appear patent. Mild-to-moderate calcified plaque is present in the aortic arch. There is a partially retropharyngeal course of the proximal right ICA. Limited intracranial: Unremarkable. Visualized orbits: Prior bilateral cataract extraction. Mastoids and visualized paranasal sinuses: Clear. Skeleton: Moderate cervical spondylosis. Diffuse cervical facet arthrosis, advanced on the left from C3-C6 with associated trace anterolisthesis at these levels. Upper chest: Minimal pleuroparenchymal scarring in the lung apices. Other: None. IMPRESSION: 1. No acute abnormality identified in the neck. 2. 12 mm soft tissue focus in the right vallecula. This may reflect asymmetric lingual tonsil, however direct visualization is suggested to exclude neoplasm. Electronically Signed   By: Logan Bores M.D.   On: 03/17/2016 08:22   Dg Chest [facility] [address]  Result Date: 03/19/2016 CLINICAL DATA:  Weakness and central chest pain of 1 week duration. EXAM: PORTABLE CHEST 1 VIEW COMPARISON:  08/14/2014 FINDINGS: Large hiatal hernia the lungs  are clear. Pulmonary vasculature is normal. Hilar and mediastinal contours are unremarkable and unchanged. Pulmonary vasculature is normal. No pleural effusions. IMPRESSION: Large hiatal hernia.  No acute cardiopulmonary findings. Electronically Signed   By: Andreas Newport M.D.   On: 03/19/2016 02:36    EKG: Orders placed or performed during the hospital encounter of 03/19/16  . ED EKG  . ED EKG    IMPRESSION AND PLAN: 83 year old elderly female patient with history of hypertension presented to the emergency room with chest discomfort. Admitting diagnosis 1. Chest pain 2. Urinary tract infection 3. Disorientation 4. Hypertension uncontrolled Treatment plan Admit patient to telemetry observation bed Check troponin and echocardiogram Start patient on oral Macrodantin for urinary tract infection Control blood pressure with hydralazine Case management evaluation for assessment of home situation Supportive care.  All the records are reviewed and case discussed with ED provider. Management plans discussed with the patient, family and they are in agreement.  CODE STATUS:FULL CODE Code Status History    This patient does not have a recorded code status. Please follow your organizational policy for patients in this situation.       TOTAL TIME TAKING CARE OF THIS PATIENT: 50 minutes.    Saundra Shelling M.D on 03/19/2016 at 5:09 AM  Between 7am to 6pm - Pager - [phone]  After 6pm go to [email or web address] - password [email or web address] [place]  Office  [phone]  CC: Primary care physician; Glendon Axe, MD

## 2016-03-19 NOTE — Discharge Instructions (Addendum)
Resume diet and activity as before.  Please call your Psychiatrist on Monday for appointment and refills

## 2016-03-19 NOTE — ED Triage Notes (Signed)
Pt arrived to ED from home reporting lip and tongue swelling for over a week. Pt verbalized she was seen in ED on Monday for nausea and reports she was sent home without anything found.   Pt also verbalized feeling very weak over the past week and while talking since arrival to ED has had difficulty pronouncing words and getting sentences out. On multiple occasions since arriving pt has referred to this as "October." However, when asked questions pt is alert and oriented x 4.   Pt also states having centralized chest pain that began over a week ago. No SOB or dizziness. Weakness and nausea reported.

## 2016-03-19 NOTE — ED Notes (Addendum)
According to EMS pt lives alone and husband is in [facility]. EMS reports pt called them out to the house multiple times this evening and continued to state she was sick but would not come into ED until EMS insisted. Per EMS when they were at the house pt was not aware what year it was or what year her birthday was.   Pt reports she lives alone and has no children or family near her. Closest son is in [place].

## 2016-03-19 NOTE — Progress Notes (Addendum)
Patient is still very nauseous after initial dose of zofran given.  Paged Dr. Darvin Neighbours to possibly get new orders for frequency of nausea medications, per patients request.    Addendum: new orders received, continue to monitor.

## 2016-03-19 NOTE — Progress Notes (Signed)
[place] responded to an OR for prayer in room 250. Pt was eating breakfast upon my arrival. Family friend was bedside. Pt presented nauseous but otherwise pleasant. Pt seemed to have some anxiety over not being able to take care of her husband. 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 [place] provided prayer for the acceptance of care, so that she will become well enough to once again give care.    03/19/16 1000  Clinical Encounter Type  Visited With Patient;Patient and family together  Visit Type Initial;Spiritual support  Referral From Nurse  Consult/Referral To Chaplain  Spiritual Encounters  Spiritual Needs Prayer;Emotional  Stress Factors  Patient Stress Factors Health changes

## 2016-03-19 NOTE — Progress Notes (Signed)
MRI results called to Dr. Darvin Neighbours.  Patient still having a lot of nausea (no emesis).  Able to eat very small meals.  Plan is to keep her one more night and reassess in the AM.  Patient agrees with this idea.

## 2016-03-19 NOTE — Progress Notes (Signed)
Initial Nutrition Assessment  DOCUMENTATION CODES:   Not applicable  INTERVENTION:  -Cater to pt preferences -Pt may benefit from smaller, more frequent meals of bland foods until nausea resolves -Recommend Ensure Enlive po daily, each supplement provides 350 kcal and 20 grams of protein  NUTRITION DIAGNOSIS:   Inadequate oral intake related to nausea, poor appetite as evidenced by per patient/family report.  GOAL:   Patient will meet greater than or equal to 90% of their needs  MONITOR:   PO intake, Labs, Weight trends  REASON FOR ASSESSMENT:   Malnutrition Screening Tool    ASSESSMENT:    83 yo female admitted with chest pain, UTI, uncontrolled HTN  Pt down for MRI brain on visit today. Per Amy RN, pt with severe nausea today, able to take some broth and liquids only.  Per weight encounters, pt weighed 167 pounds in June (5.3% wt loss). Pt weighed 160 pounds in September (1.3% wt loss)  Labs: reviewed Meds: MVI  Diet Order:  Diet Heart Room service appropriate? Yes; Fluid consistency: Thin  Skin:  Reviewed, no issues  Last BM:  no documented BM  Height:   Ht Readings from Last 1 Encounters:  03/19/16 5\' 4"  (1.626 m)    Weight:   Wt Readings from Last 1 Encounters:  03/19/16 158 lb (71.7 kg)    BMI:  Body mass index is 27.12 kg/m.  Estimated Nutritional Needs:   Kcal:  1400-1700 kcals  Protein:  70-85 g  Fluid:  >/= 1.5 L  EDUCATION NEEDS:   No education needs identified at this time  [place], [place], [place] [phone] Pager  [phone] Weekend/On-Call Pager

## 2016-03-19 NOTE — Evaluation (Signed)
Physical Therapy Evaluation Patient Details Name: Alexandria Roman MRN: [id number] DOB: 1929/09/26 Today's Date: 03/19/2016   History of Present Illness  Patient is an 83 y/o female admitted for chest pain, noted to have hypertension while in the hospital.   Clinical Impression  Pt is a pleasant 83 y/o female admitted for chest pain. She reports she has been independent at home, but occasionally furniture cruises. Today she is independent with bed mobility, however she requires at least 1, but more often 2 HHA in ambulation in her room. Given this, PT observed patient ambulate on RW which was much more appropriate gait speed and mechanics. PT educated patient to use RW at home, and work with HHPT to address her decline in balance/strength.     Follow Up Recommendations Home health PT    Equipment Recommendations  Rolling walker with 5" wheels    Recommendations for Other Services       Precautions / Restrictions Precautions Precautions: Fall Restrictions Weight Bearing Restrictions: No      Mobility  Bed Mobility Overal bed mobility: Independent             General bed mobility comments: No deficits identified with HOB elevated with bed mobility.   Transfers Overall transfer level: Needs assistance Equipment used: 1 person hand held assist Transfers: Sit to/from Stand Sit to Stand: Supervision         General transfer comment: Patient demonstrates appropriate speed for transfer with no loss of balance observed.   Ambulation/Gait Ambulation/Gait assistance: Min guard Ambulation Distance (Feet): 300 Feet Assistive device: Rolling walker (2 wheeled) Gait Pattern/deviations: WFL(Within Functional Limits)   Gait velocity interpretation: at or above normal speed for age/gender General Gait Details: Patient initially ambulated in room with 1 HHA, she continued to reach for objects to hold on to, thus PT chose RW for AD. Patient demonstrated no loss of balance and  appropriate gait speed with RW in hand, educated to use at home for safety.   Stairs            Wheelchair Mobility    Modified Rankin (Stroke Patients Only)       Balance Overall balance assessment: Needs assistance Sitting-balance support: No upper extremity supported Sitting balance-Leahy Scale: Good     Standing balance support: Bilateral upper extremity supported Standing balance-Leahy Scale: Good                               Pertinent Vitals/Pain Pain Assessment: No/denies pain    Home Living Family/patient expects to be discharged to:: Private residence Living Arrangements: Alone   Type of Home: House         Home Equipment: Walker - 2 wheels      Prior Function Level of Independence: Independent         Comments: Patient reports her husband recently moved into a nursing home, she now lives at home alone. She does not describe her home set up in this evaluation.      Hand Dominance        Extremity/Trunk Assessment   Upper Extremity Assessment Upper Extremity Assessment: Overall WFL for tasks assessed    Lower Extremity Assessment Lower Extremity Assessment: Overall WFL for tasks assessed       Communication   Communication: No difficulties  Cognition Arousal/Alertness: Awake/alert Behavior During Therapy: WFL for tasks assessed/performed;Anxious Overall Cognitive Status: Within Functional Limits for tasks assessed  General Comments      Exercises     Assessment/Plan    PT Assessment Patient needs continued PT services  PT Problem List Decreased strength;Decreased balance;Decreased mobility;Decreased knowledge of use of DME;Decreased safety awareness          PT Treatment Interventions Gait training;DME instruction;Therapeutic activities;Therapeutic exercise;Stair training;Balance training;Neuromuscular re-education    PT Goals (Current goals can be found in the Care Plan section)   Acute Rehab PT Goals Patient Stated Goal: To return home  PT Goal Formulation: With patient Time For Goal Achievement: 04/02/16 Potential to Achieve Goals: Good    Frequency Min 2X/week   Barriers to discharge        Co-evaluation               End of Session Equipment Utilized During Treatment: Gait belt Activity Tolerance: Patient tolerated treatment well Patient left: in bed;with bed alarm set;with call bell/phone within reach Nurse Communication: Mobility status    Functional Assessment Tool Used: Clinical Judgement  Functional Limitation: Mobility: Walking and moving around Mobility: Walking and Moving Around Current Status [id number]): At least 1 percent but less than 20 percent impaired, limited or restricted Mobility: Walking and Moving Around Goal Status [phone]): At least 1 percent but less than 20 percent impaired, limited or restricted    Time: 1112-1128 PT Time Calculation (min) (ACUTE ONLY): 16 min   Charges:   PT Evaluation $PT Eval Low Complexity: 1 Procedure     PT G Codes:   PT G-Codes **NOT FOR INPATIENT CLASS** Functional Assessment Tool Used: Clinical Judgement  Functional Limitation: Mobility: Walking and moving around Mobility: Walking and Moving Around Current Status [id number]): At least 1 percent but less than 20 percent impaired, limited or restricted Mobility: Walking and Moving Around Goal Status [phone]): At least 1 percent but less than 20 percent impaired, limited or restricted   Kerman Passey, PT, DPT    03/19/2016, 11:45 AM

## 2016-03-20 ENCOUNTER — Observation Stay: Admit: 2016-03-20 | Payer: Medicare Other

## 2016-03-20 MED ORDER — LORAZEPAM 0.5 MG PO TABS: 0.5000 mg | ORAL_TABLET | Freq: Two times a day (BID) | ORAL | 0 refills | Status: DC

## 2016-03-20 MED ORDER — LORAZEPAM 0.5 MG PO TABS: 0.5000 mg | ORAL_TABLET | Freq: Two times a day (BID) | ORAL | Status: DC

## 2016-03-20 NOTE — Progress Notes (Signed)
Told pt that she will be discharged this afternoon after lunch. Pt is excited about going home.  Call bell within reach, bed in low  Position.  Will frequent the pt throughout the shift.

## 2016-03-20 NOTE — Care Management Note (Signed)
Case Management Note  Patient Details  Name: Alexandria Roman MRN: [id number] Date of Birth: [birth year]  Subjective/Objective:      Discussed discharge planning with Mrs Debarr. She chose [facility] for HH-PT and a referral was called to [place] at [facility] requesting HH-PT. No other discharge needs identified.               Action/Plan:   Expected Discharge Date:  03/20/16               Expected Discharge Plan:     In-House Referral:     Discharge planning Services     Post Acute Care Choice:    Choice offered to:     DME Arranged:    DME Agency:     HH Arranged:    HH Agency:     Status of Service:     If discussed at [employer], dates discussed:    Additional Comments:  Buffi Ewton A, RN 03/20/2016, 12:03 PM

## 2016-03-20 NOTE — Progress Notes (Signed)
Pt is alert and confused.  Answers 2/5 questions correctly.  Pt is very talkative.  Pt is very concerned about her husband that is in the facility [facility] in [place].  Listened to the pt and allowed her to verbalze her feelings.  Will continue to monitor the pt.

## 2016-03-29 NOTE — Discharge Summary (Signed)
[facility] NAME: Alexandria Roman    MR#:  [id number]  DATE OF BIRTH:  [birth year]  DATE OF ADMISSION:  03/19/2016 ADMITTING PHYSICIAN: Saundra Shelling, MD  DATE OF DISCHARGE: 03/20/2016  4:37 PM  PRIMARY CARE PHYSICIAN: Singh,Jasmine, MD   ADMISSION DIAGNOSIS:  Lower urinary tract infectious disease [N39.0] Weakness [R53.1] Altered mental status, unspecified altered mental status type [R41.82] Chest pain, unspecified type [R07.9]  DISCHARGE DIAGNOSIS:  Active Problems:   Chest pain   SECONDARY DIAGNOSIS:   Past Medical History:  Diagnosis Date  . Cancer ([place])   . Hypertension      ADMITTING HISTORY  HISTORY OF PRESENT ILLNESS: Alexandria Roman  is a 83 y.o. female with a known history of Hypertension presented to the emergency room with chest discomfort and weakness. Patient called EMS 3 times today telling them that she is feeling sick and was finally brought to the emergency room. Recently patient's husband has been moved to [facility] facility. Patient lives alone at home and unable to take care of herself. Her son lives in [place]. Today the emergency room patient complained of chest discomfort located in the retrosternal area. Pain is aching in nature 4 out of 10 on a scale of 1-10. She was also diagnosed recently with a nodule over the vallecula for which she has ENT follow-up as outpatient. Patient was little confused when she presented to the emergency room but later on her mental status improved and she is oriented to time place and person. No history of fall or head injury. Has increased urinary frequency. No fever or chills. Workup in the emergency room with CT head showed no acute intracranial abnormality. Urinalysis showed infection. First set of troponin was negative. No complaints of shortness of breath and orthopnea. Hospitalist service was consulted for further care of the patient.  HOSPITAL COURSE:   *  GERD Patient presented with chest pain, nausea and vomiting. Was started on PPIs and anti-medics with which her symptoms improved. Telemetry showed no arrhythmias. Troponin normal.  * Altered mental status with baseline dementia CT scan of the head showed nothing acute. An MRI showed age-related changes. No stroke or mass.  Patient was seen by physical therapy and advised home health. This has been set up at discharge.  CONSULTS OBTAINED:    DRUG ALLERGIES:   Allergies  Allergen Reactions  . Amoxicillin-Pot Clavulanate Diarrhea  . Celecoxib Nausea And Vomiting  . Cephalexin Other (See Comments)  . Codeine Nausea Only  . Cyclobenzaprine Other (See Comments) and Nausea Only  . Hydrochlorothiazide Other (See Comments)  . Hydrocodone-Chlorpheniramine Other (See Comments)  . Hydrocodone-Homatropine Other (See Comments)  . Metronidazole Other (See Comments)    Other Reaction: Not Assessed  . Moxifloxacin Other (See Comments)  . Oxycodone-Acetaminophen Nausea Only and Nausea And Vomiting  . Penicillin V Potassium     Other reaction(s): Unknown  . Prednisone Other (See Comments)  . Simvastatin Other (See Comments)  . Sulfa Antibiotics Other (See Comments)  . Tramadol Other (See Comments)    DISCHARGE MEDICATIONS:   Discharge Medication List as of 03/20/2016  2:48 PM    START taking these medications   Details  nitrofurantoin, macrocrystal-monohydrate, (MACROBID) 100 MG capsule Take 1 capsule (100 mg total) by mouth 2 (two) times daily., Starting Fri 03/19/2016, Normal    ondansetron (ZOFRAN-ODT) 4 MG disintegrating tablet Take 1 tablet (4 mg total) by mouth every 8 (eight) hours as needed for nausea  or vomiting., Starting Fri 03/19/2016, Normal    pantoprazole (PROTONIX) 40 MG tablet Take 1 tablet (40 mg total) by mouth 2 (two) times daily before a meal., Starting Fri 03/19/2016, Normal      CONTINUE these medications which have CHANGED   Details  LORazepam (ATIVAN) 0.5 MG tablet  Take 1 tablet (0.5 mg total) by mouth 2 (two) times daily., Starting Sat 03/20/2016, Print      CONTINUE these medications which have NOT CHANGED   Details  calcium-vitamin D (OSCAL WITH D) 500-200 MG-UNIT tablet Take 1 tablet by mouth 2 (two) times daily., Historical Med    docusate sodium (COLACE) 100 MG capsule Take 100 mg by mouth 2 (two) times daily., Historical Med    DULoxetine (CYMBALTA) 30 MG capsule Take 90 mg by mouth daily., Historical Med    econazole nitrate 1 % cream Apply 1 application topically daily., Historical Med    gabapentin (NEURONTIN) 100 MG capsule Take 100 mg by mouth at bedtime., Historical Med    levothyroxine (SYNTHROID, LEVOTHROID) 50 MCG tablet Take 50 mcg by mouth daily before breakfast., Historical Med    Multiple Vitamins-Minerals (PRESERVISION/LUTEIN) CAPS Take 1 capsule by mouth 2 (two) times daily., Historical Med    olopatadine (PATANOL) 0.1 % ophthalmic solution 1 drop 2 (two) times daily., Historical Med    omega-3 acid ethyl esters (LOVAZA) 1 g capsule Take 1 g by mouth daily., Historical Med    oxybutynin (DITROPAN-XL) 5 MG 24 hr tablet Take 5 mg by mouth at bedtime., Historical Med    pravastatin (PRAVACHOL) 40 MG tablet Take 40 mg by mouth daily., Historical Med    valsartan (DIOVAN) 160 MG tablet Take 160 mg by mouth daily., Historical Med      STOP taking these medications     ranitidine (ZANTAC) 150 MG tablet      zolpidem (AMBIEN) 5 MG tablet         Today   VITAL SIGNS:  Blood pressure 128/69, pulse 91, temperature 98.2 F (36.8 C), temperature source Oral, resp. rate 18, height 5\' 4"  (1.626 m), weight 71.7 kg (158 lb), SpO2 95 %.  I/O:  No intake or output data in the 24 hours ending 03/29/16 1440  PHYSICAL EXAMINATION:  Physical Exam  GENERAL:  83 y.o.-year-old patient lying in the bed with no acute distress.  LUNGS: Normal breath sounds bilaterally, no wheezing, rales,rhonchi or crepitation. No use of accessory  muscles of respiration.  CARDIOVASCULAR: S1, S2 normal. No murmurs, rubs, or gallops.  ABDOMEN: Soft, non-tender, non-distended. Bowel sounds present. No organomegaly or mass.  NEUROLOGIC: Moves all 4 extremities. PSYCHIATRIC: The patient is alert and oriented x 3.  SKIN: No obvious rash, lesion, or ulcer.   DATA REVIEW:   CBC No results for input(s): WBC, HGB, HCT, PLT in the last 168 hours.  Chemistries  No results for input(s): NA, K, CL, CO2, GLUCOSE, BUN, CREATININE, CALCIUM, MG, AST, ALT, ALKPHOS, BILITOT in the last 168 hours.  Invalid input(s): GFRCGP  Cardiac Enzymes No results for input(s): TROPONINI in the last 168 hours.  Microbiology Results  No results found for this or any previous visit.  RADIOLOGY:  No results found.  Follow up with PCP in 1 week.  Management plans discussed with the patient, family and they are in agreement.  CODE STATUS:  Code Status History    Date Active Date Inactive Code Status Order ID Comments User Context   03/19/2016  5:44 AM 03/20/2016  7:42 PM  Full Code [id number]  Saundra Shelling, MD ED      TOTAL TIME TAKING CARE OF THIS PATIENT ON DAY OF DISCHARGE: more than 30 minutes.   Hillary Bow R M.D on 03/29/2016 at 2:40 PM  Between 7am to 6pm - Pager - [phone]  After 6pm go to [email or web address] - password [email or web address] [facility]  Office  [phone]  CC: Primary care physician; Glendon Axe, MD  Note: This dictation was prepared with Dragon dictation along with smaller phrase technology. Any transcriptional errors that result from this process are unintentional.

## 2016-04-15 DIAGNOSIS — M810 Age-related osteoporosis without current pathological fracture: Secondary | ICD-10-CM | POA: Insufficient documentation

## 2016-07-20 DIAGNOSIS — M170 Bilateral primary osteoarthritis of knee: Secondary | ICD-10-CM | POA: Insufficient documentation

## 2016-09-15 ENCOUNTER — Ambulatory Visit (INDEPENDENT_AMBULATORY_CARE_PROVIDER_SITE_OTHER): Payer: Medicare Other | Admitting: Podiatry

## 2016-09-15 ENCOUNTER — Encounter: Payer: Self-pay | Admitting: Podiatry

## 2016-09-15 ENCOUNTER — Ambulatory Visit (INDEPENDENT_AMBULATORY_CARE_PROVIDER_SITE_OTHER): Payer: Medicare Other

## 2016-09-15 DIAGNOSIS — S93401A Sprain of unspecified ligament of right ankle, initial encounter: Secondary | ICD-10-CM | POA: Diagnosis not present

## 2016-09-15 DIAGNOSIS — M7751 Other enthesopathy of right foot: Secondary | ICD-10-CM | POA: Diagnosis not present

## 2016-09-15 NOTE — Progress Notes (Signed)
She presents today with a chief complaint of pain to the anterolateral ankle of the right foot 2 months. She states that she saw Dr. Elvina Mattes E made her orthotics and was seen at the urgent care where x-rays were taken and said that there were no fractures. She states that I still have pain in this lump which is very soft but feels like her something in there. I does feel the something needs to be done.  Objective: Vital signs are stable she is alert and 3 radiographs taken today do not demonstrate any type of osseus 7 mallet. No fractures are identified. Pulses are palpable. Lipoma is present anterolateral ankle with some tenderness in the sinus tarsi area.  Assessment: Sinus tarsitis lipoma.  Plan: Place her in a compression anklet after injecting the lipoma with Kenalog and local anesthetic. I also injected the subtalar joint with the same injection.

## 2016-12-06 DIAGNOSIS — J069 Acute upper respiratory infection, unspecified: Secondary | ICD-10-CM | POA: Insufficient documentation

## 2016-12-08 ENCOUNTER — Encounter: Payer: Self-pay | Admitting: Family Medicine

## 2016-12-08 ENCOUNTER — Ambulatory Visit (INDEPENDENT_AMBULATORY_CARE_PROVIDER_SITE_OTHER): Payer: Medicare Other | Admitting: Family Medicine

## 2016-12-08 ENCOUNTER — Ambulatory Visit: Payer: Self-pay | Admitting: Family Medicine

## 2016-12-08 VITALS — BP 118/68 | HR 68 | Resp 16 | Ht 64.0 in | Wt 159.0 lb

## 2016-12-08 DIAGNOSIS — M810 Age-related osteoporosis without current pathological fracture: Secondary | ICD-10-CM

## 2016-12-08 DIAGNOSIS — F418 Other specified anxiety disorders: Secondary | ICD-10-CM | POA: Diagnosis not present

## 2016-12-08 DIAGNOSIS — N183 Chronic kidney disease, stage 3 unspecified: Secondary | ICD-10-CM

## 2016-12-08 DIAGNOSIS — R200 Anesthesia of skin: Secondary | ICD-10-CM

## 2016-12-08 DIAGNOSIS — R232 Flushing: Secondary | ICD-10-CM | POA: Diagnosis not present

## 2016-12-08 DIAGNOSIS — E782 Mixed hyperlipidemia: Secondary | ICD-10-CM

## 2016-12-08 DIAGNOSIS — I10 Essential (primary) hypertension: Secondary | ICD-10-CM | POA: Diagnosis not present

## 2016-12-08 DIAGNOSIS — R2681 Unsteadiness on feet: Secondary | ICD-10-CM | POA: Diagnosis not present

## 2016-12-08 DIAGNOSIS — Z23 Encounter for immunization: Secondary | ICD-10-CM

## 2016-12-08 DIAGNOSIS — M170 Bilateral primary osteoarthritis of knee: Secondary | ICD-10-CM

## 2016-12-08 DIAGNOSIS — J0100 Acute maxillary sinusitis, unspecified: Secondary | ICD-10-CM | POA: Diagnosis not present

## 2016-12-08 DIAGNOSIS — E039 Hypothyroidism, unspecified: Secondary | ICD-10-CM

## 2016-12-09 ENCOUNTER — Other Ambulatory Visit: Payer: Self-pay | Admitting: Family Medicine

## 2016-12-09 DIAGNOSIS — H3530 Unspecified macular degeneration: Secondary | ICD-10-CM | POA: Insufficient documentation

## 2016-12-09 DIAGNOSIS — R200 Anesthesia of skin: Secondary | ICD-10-CM | POA: Insufficient documentation

## 2016-12-09 DIAGNOSIS — F418 Other specified anxiety disorders: Secondary | ICD-10-CM | POA: Insufficient documentation

## 2016-12-09 LAB — COMPREHENSIVE METABOLIC PANEL
ALK PHOS: 67 IU/L (ref 39–117)
ALT: 12 IU/L (ref 0–32)
AST: 15 IU/L (ref 0–40)
Albumin/Globulin Ratio: 1.8 (ref 1.2–2.2)
Albumin: 4.5 g/dL (ref 3.5–4.7)
BUN/Creatinine Ratio: 18 (ref 12–28)
BUN: 17 mg/dL (ref 8–27)
Bilirubin Total: 0.3 mg/dL (ref 0.0–1.2)
CHLORIDE: 99 mmol/L (ref 96–106)
CO2: 25 mmol/L (ref 20–29)
Calcium: 10.4 mg/dL — ABNORMAL HIGH (ref 8.7–10.3)
Creatinine, Ser: 0.95 mg/dL (ref 0.57–1.00)
GFR calc Af Amer: 64 mL/min/{1.73_m2} (ref >59)
GFR calc non Af Amer: 55 mL/min/{1.73_m2} — ABNORMAL LOW (ref >59)
GLUCOSE: 100 mg/dL — AB (ref 65–99)
Globulin, Total: 2.5 g/dL (ref 1.5–4.5)
Potassium: 5.6 mmol/L — ABNORMAL HIGH (ref 3.5–5.2)
Sodium: 140 mmol/L (ref 134–144)
TOTAL PROTEIN: 7 g/dL (ref 6.0–8.5)

## 2016-12-09 LAB — LIPID PANEL
CHOL/HDL RATIO: 2.3 ratio (ref 0.0–4.4)
Cholesterol, Total: 197 mg/dL (ref 100–199)
HDL: 85 mg/dL (ref >39)
LDL CALC: 67 mg/dL (ref 0–99)
TRIGLYCERIDES: 224 mg/dL — AB (ref 0–149)
VLDL Cholesterol Cal: 45 mg/dL — ABNORMAL HIGH (ref 5–40)

## 2016-12-09 LAB — CBC
HEMATOCRIT: 44.4 % (ref 34.0–46.6)
HEMOGLOBIN: 15 g/dL (ref 11.1–15.9)
MCH: 30.5 pg (ref 26.6–33.0)
MCHC: 33.8 g/dL (ref 31.5–35.7)
MCV: 90 fL (ref 79–97)
Platelets: 351 10*3/uL (ref 150–379)
RBC: 4.92 x10E6/uL (ref 3.77–5.28)
RDW: 15.2 % (ref 12.3–15.4)
WBC: 7.9 10*3/uL (ref 3.4–10.8)

## 2016-12-09 LAB — VITAMIN B12: Vitamin B-12: 405 pg/mL (ref 232–1245)

## 2016-12-09 LAB — TSH: TSH: 3.53 u[IU]/mL (ref 0.450–4.500)

## 2016-12-09 LAB — VITAMIN D 25 HYDROXY (VIT D DEFICIENCY, FRACTURES): Vit D, 25-Hydroxy: 30.2 ng/mL (ref 30.0–100.0)

## 2016-12-09 MED ORDER — GABAPENTIN 300 MG PO CAPS: 600.0000 mg | ORAL_CAPSULE | Freq: Two times a day (BID) | ORAL | Status: DC

## 2016-12-09 MED ORDER — VITAMIN D3 25 MCG (1000 UT) PO CAPS: 1.0000 | ORAL_CAPSULE | Freq: Every day | ORAL | Status: DC

## 2016-12-09 NOTE — Progress Notes (Signed)
Date:  12/08/2016   Name:  SAMYRAH BRUSTER   DOB:  Dec 23, 1929   MRN:  [id number]  PCP:  Glendon Axe, MD    Chief Complaint: Establish Care (Leaving doctor Candiss Norse needs change in doctor who knows gereatrics ) and Hot Flashes (Stared 3-4 weeks ago.Marland KitchenMarland KitchenAlso has had sinus issues and seen by ENT several times. Took Prednisone and Abx. )   History of Present Illness:  This is a 83 y.o. female seen for initial visit. C/o hot flashes x months, plans to see Gyn. Husband died in 06/21/2022. Gaba helps pain and sleep. Cymbalta and lorazepam per psychiatrist Dr. Clovis Riley, lorazepam tapered from 4 mg to 1.5 mg daily. Hypothyroid on Synthroid, TSH ok in Feb. HTN on losartan, HLD on prvastatin. MD sees optho on Preservision and eye gtts. Takes Ca++/vit D bid for osteoporosis, on Fosamax in past. C/o sinus congestion x 3 wks, has seen ENT x 2, pred/Claritin/Flonase no help, abx caused nausea, ENT last week felt getting better. S/p B TKR, has spinal stenosis with occ LBP, s/p epidural inj in past, sees Dr. Sharlet Salina, uses Aleve prn. CKD3, eGFR 54 in Feb. C/o B foot numbness during day, weight stable, insomnia better on gaba. Father died prostate ca 27, mother died old age 54, no sibs. Gets flu imm at pharmacy, Tdap 2014, zoster imm 2016, needs Prevnar..  Review of Systems:  Review of Systems  Constitutional: Negative for chills and fever.  HENT: Negative for ear pain and trouble swallowing.   Eyes: Negative for pain.  Respiratory: Negative for cough and shortness of breath.   Cardiovascular: Negative for chest pain and leg swelling.  Gastrointestinal: Negative for abdominal pain, constipation and diarrhea.  Genitourinary: Negative for difficulty urinating.  Musculoskeletal: Negative for myalgias.  Neurological: Negative for syncope and light-headedness.    Patient Active Problem List   Diagnosis Date Noted  . Gait instability 12/08/2016  . Upper respiratory infection, viral 12/06/2016  . Primary osteoarthritis  of both knees 07/20/2016  . Postmenopausal osteoporosis 04/15/2016  . Chest pain 03/19/2016  . Spinal stenosis of lumbar region with radiculopathy 08/04/2015  . Myalgia 06/11/2015  . Polyarthralgia 06/11/2015  . Chronic midline low back pain with bilateral sciatica 01/10/2015  . Benign essential hypertension 06/04/2014  . CKD (chronic kidney disease) stage 3, GFR 30-59 ml/min (HCC) 06/04/2014  . Mixed hyperlipidemia 06/04/2014  . Other allergic rhinitis 06/04/2014  . DDD (degenerative disc disease), lumbar 09/07/2013  . Lumbar radiculitis 09/07/2013  . Hypothyroidism, unspecified 08/02/2013    Prior to Admission medications   Medication Sig Start Date End Date Taking? Authorizing Provider  Calcium Carb-Cholecalciferol 600-100 MG-UNIT CAPS Take 1 capsule by mouth 2 (two) times daily.   Yes [provider]  DULoxetine (CYMBALTA) 30 MG capsule Take 1 capsule by mouth 2 (two) times daily. 11/03/16  Yes [provider]  gabapentin (NEURONTIN) 300 MG capsule two capsules at bedtime as needed and one capsule twice a day as needed.   Yes [provider]  levothyroxine (SYNTHROID, LEVOTHROID) 50 MCG tablet Take 50 mcg by mouth daily before breakfast.   Yes [provider]  LORazepam (ATIVAN) 0.5 MG tablet Take 1 tablet (0.5 mg total) by mouth 2 (two) times daily. 03/20/16  Yes Sudini, Alveta Heimlich, MD  losartan (COZAAR) 25 MG tablet Take 1 tablet by mouth daily. 09/27/16 09/27/17 Yes [provider]  Multiple Vitamins-Minerals (PRESERVISION/LUTEIN) CAPS Take 1 capsule by mouth 2 (two) times daily.   Yes [provider]  olopatadine (PATANOL)  0.1 % ophthalmic solution  11/03/16  Yes [provider]  pravastatin (PRAVACHOL) 40 MG tablet Take 1 tablet by mouth daily. 11/03/16  Yes [provider]    Allergies  Allergen Reactions  . Amoxicillin-Pot Clavulanate Diarrhea  . Cephalexin Other (See Comments) and Nausea And Vomiting  .  Hydrochlorothiazide Other (See Comments) and Nausea And Vomiting  . Hydrocodone-Chlorpheniramine Other (See Comments)  . Hydrocodone-Homatropine Other (See Comments)  . Metronidazole Other (See Comments)    Other reaction(s): Other (See Comments) Other Reaction: Not Assessed Other Reaction: Not Assessed  . Moxifloxacin Other (See Comments)  . Prednisone Other (See Comments)  . Simvastatin Other (See Comments)  . Sulfa Antibiotics Other (See Comments)    Other reaction(s): Unknown  . Sulfasalazine Other (See Comments)    Other reaction(s): Unknown  . Tramadol Other (See Comments)  . Celecoxib Nausea And Vomiting  . Codeine Nausea Only    Other reaction(s): Other (See Comments) Other reaction(s): Unknown  . Cyclobenzaprine Other (See Comments) and Nausea Only    Other reaction(s): Other (See Comments)  . Oxycodone-Acetaminophen Nausea Only and Nausea And Vomiting  . Penicillin V Potassium Rash    Other reaction(s): Other (See Comments) Other reaction(s): Unknown    Past Surgical History:  Procedure Laterality Date  . ABDOMINAL HYSTERECTOMY      Social History  Substance Use Topics  . Smoking status: Never Smoker  . Smokeless tobacco: Never Used  . Alcohol use No    Family History  Problem Relation Age of Onset  . Testicular cancer Father     Medication list has been reviewed and updated.  Physical Examination: BP 118/68   Pulse 68   Resp 16   Ht 5' 4" (1.626 m)   Wt 159 lb (72.1 kg)   SpO2 95%   BMI 27.29 kg/m   Physical Exam  Constitutional: She is oriented to person, place, and time. She appears well-developed and well-nourished.  HENT:  Head: Normocephalic and atraumatic.  Right Ear: External ear normal.  Left Ear: External ear normal.  Nose: Nose normal.  Mouth/Throat: Oropharynx is clear and moist.  TMs clear Mild B max sinus tenderness  Eyes: Pupils are equal, round, and reactive to light. Conjunctivae and EOM are normal.  Neck: Neck supple. No  thyromegaly present.  Cardiovascular: Normal rate, regular rhythm, normal heart sounds and intact distal pulses.   Pulmonary/Chest: Effort normal and breath sounds normal.  Abdominal: Soft. She exhibits no distension and no mass. There is no tenderness.  Musculoskeletal: She exhibits no edema.  Lymphadenopathy:    She has no cervical adenopathy.  Neurological: She is alert and oriented to person, place, and time. Coordination normal.  Romberg positive, gait unstable  Skin: Skin is warm and dry.  Psychiatric: She has a normal mood and affect. Her behavior is normal.  Nursing note and vitals reviewed.   Assessment and Plan:  1. CKD (chronic kidney disease) stage 3, GFR 30-59 ml/min (HCC) Avoid NSAIDS, cont ARB  2. Benign essential hypertension Well controlled on losartan - Comprehensive Metabolic Panel (CMET) - CBC  3. Subacute maxillary sinusitis Improving per pt, has seen ENT, consider abx if persists but allergic multiple meds  4. Hypothyroidism, unspecified type On Synthroid - TSH  5. Postmenopausal osteoporosis On Ca++/vit D, Fosamax in past - Vitamin D (25 hydroxy)  6. Primary osteoarthritis of both knees S/p B TKR  7. Mixed hyperlipidemia On pravastatin, unclear indication for use - Lipid Profile  8. Gait instability -  B12  9. Hot flashes To discuss with Gyn  10. Depression with anxiety Psych following, cont Cymbalta/lorazepam taper  11. Numbness of foot Likely PN, consider increased gabapentin  12. Need for vaccination for pneumococcus - Pneumococcal conjugate vaccine 13-valent  Return in about 4 weeks (around 01/05/2017).  45 mins spent with patient over half in counseling   M. , Jr. MD [facility]  12/09/2016  

## 2017-01-05 ENCOUNTER — Ambulatory Visit: Payer: Medicare Other | Admitting: Family Medicine

## 2017-01-19 ENCOUNTER — Emergency Department
Admission: EM | Admit: 2017-01-19 | Discharge: 2017-01-19 | Disposition: A | Discharge status: Home / Self Care | Payer: Medicare Other | Attending: Emergency Medicine | Admitting: Emergency Medicine

## 2017-01-19 ENCOUNTER — Encounter: Payer: Self-pay | Admitting: Emergency Medicine

## 2017-01-19 ENCOUNTER — Emergency Department: Payer: Medicare Other

## 2017-01-19 DIAGNOSIS — W010XXA Fall on same level from slipping, tripping and stumbling without subsequent striking against object, initial encounter: Secondary | ICD-10-CM | POA: Insufficient documentation

## 2017-01-19 DIAGNOSIS — Y998 Other external cause status: Secondary | ICD-10-CM | POA: Diagnosis not present

## 2017-01-19 DIAGNOSIS — F329 Major depressive disorder, single episode, unspecified: Secondary | ICD-10-CM | POA: Insufficient documentation

## 2017-01-19 DIAGNOSIS — I129 Hypertensive chronic kidney disease with stage 1 through stage 4 chronic kidney disease, or unspecified chronic kidney disease: Secondary | ICD-10-CM | POA: Diagnosis not present

## 2017-01-19 DIAGNOSIS — Z79899 Other long term (current) drug therapy: Secondary | ICD-10-CM | POA: Insufficient documentation

## 2017-01-19 DIAGNOSIS — E039 Hypothyroidism, unspecified: Secondary | ICD-10-CM | POA: Diagnosis not present

## 2017-01-19 DIAGNOSIS — F419 Anxiety disorder, unspecified: Secondary | ICD-10-CM | POA: Insufficient documentation

## 2017-01-19 DIAGNOSIS — Z859 Personal history of malignant neoplasm, unspecified: Secondary | ICD-10-CM | POA: Insufficient documentation

## 2017-01-19 DIAGNOSIS — M545 Low back pain: Secondary | ICD-10-CM | POA: Diagnosis present

## 2017-01-19 DIAGNOSIS — Y929 Unspecified place or not applicable: Secondary | ICD-10-CM | POA: Diagnosis not present

## 2017-01-19 DIAGNOSIS — R52 Pain, unspecified: Secondary | ICD-10-CM

## 2017-01-19 DIAGNOSIS — N183 Chronic kidney disease, stage 3 (moderate): Secondary | ICD-10-CM | POA: Insufficient documentation

## 2017-01-19 DIAGNOSIS — S32010A Wedge compression fracture of first lumbar vertebra, initial encounter for closed fracture: Secondary | ICD-10-CM | POA: Diagnosis not present

## 2017-01-19 DIAGNOSIS — Y9389 Activity, other specified: Secondary | ICD-10-CM | POA: Insufficient documentation

## 2017-01-19 MED ORDER — LORAZEPAM 0.5 MG PO TABS
0.5000 mg | ORAL_TABLET | Freq: Once | ORAL | Status: AC
  Administered 2017-01-19: 0.5 mg via ORAL
  Filled 2017-01-19: qty 1

## 2017-01-19 MED ORDER — MORPHINE SULFATE (PF) 4 MG/ML IV SOLN
4.0000 mg | Freq: Once | INTRAVENOUS | Status: AC
  Administered 2017-01-19: 4 mg via INTRAVENOUS
  Filled 2017-01-19: qty 1

## 2017-01-19 MED ORDER — MORPHINE SULFATE (PF) 4 MG/ML IV SOLN: 6.0000 mg | Freq: Once | INTRAVENOUS | Status: DC

## 2017-01-19 MED ORDER — LIDOCAINE 5 % EX PTCH
1.0000 | MEDICATED_PATCH | CUTANEOUS | Status: DC
  Administered 2017-01-19: 1 via TRANSDERMAL
  Filled 2017-01-19: qty 1

## 2017-01-19 MED ORDER — ONDANSETRON HCL 4 MG/2ML IJ SOLN
4.0000 mg | Freq: Once | INTRAMUSCULAR | Status: AC
  Administered 2017-01-19: 4 mg via INTRAVENOUS
  Filled 2017-01-19: qty 2

## 2017-01-19 MED ORDER — HYDROCODONE-ACETAMINOPHEN 5-325 MG PO TABS
1.0000 | ORAL_TABLET | Freq: Once | ORAL | Status: AC
  Administered 2017-01-19: 1 via ORAL
  Filled 2017-01-19: qty 1

## 2017-01-19 NOTE — ED Provider Notes (Signed)
[facility] Emergency Department Provider Note  ____________________________________________   I have reviewed the triage vital signs and the nursing notes.   HISTORY  Chief Complaint Back Pain   History limited by: Not Limited   HPI Alexandria Roman is a 83 y.o. female who presents to the emergency department today because of concern for continued low back pain.   LOCATION:lower back DURATION:3 days TIMING: started suddenly after fall, has been constant SEVERITY: severe QUALITY: sharp CONTEXT: patient fell three days ago while trying to put up curtains. Went to [facility] 2 days ago for pain. States that she had an x-ray done at that time which was read as negative. Was started on muscle relaxers, narcotic pain medication and steroids. Last took the medicine last night.  MODIFYING FACTORS: no significant relief with medication ASSOCIATED SYMPTOMS: denies any numbness or weakness of the legs. Denies any incontinence or change in urinary or bowel habits. Denies any fevers.  Per medical record review patient has a history of chronic back pain issues, has had epidural.   Past Medical History:  Diagnosis Date  . Cancer ([place])   . Hypertension     Patient Active Problem List   Diagnosis Date Noted  . Depression with anxiety 12/09/2016  . Numbness of foot 12/09/2016  . Macular degeneration 12/09/2016  . Gait instability 12/08/2016  . Upper respiratory infection, viral 12/06/2016  . Primary osteoarthritis of both knees 07/20/2016  . Postmenopausal osteoporosis 04/15/2016  . Chest pain 03/19/2016  . Spinal stenosis 08/04/2015  . Myalgia 06/11/2015  . Polyarthralgia 06/11/2015  . Chronic midline low back pain with bilateral sciatica 01/10/2015  . Benign essential hypertension 06/04/2014  . CKD (chronic kidney disease) stage 3, GFR 30-59 ml/min (HCC) 06/04/2014  . Mixed hyperlipidemia 06/04/2014  . Other allergic rhinitis 06/04/2014  . DDD  (degenerative disc disease), lumbar 09/07/2013  . Lumbar radiculitis 09/07/2013  . Hypothyroidism, unspecified 08/02/2013    Past Surgical History:  Procedure Laterality Date  . ABDOMINAL HYSTERECTOMY      Prior to Admission medications   Medication Sig Start Date End Date Taking? Authorizing Provider  Cholecalciferol (VITAMIN D3) 1000 units CAPS Take 1 capsule (1,000 Units total) by mouth daily. 12/09/16   Plonk, Gwyndolyn Saxon, MD  DULoxetine (CYMBALTA) 30 MG capsule Take 1 capsule by mouth 2 (two) times daily. 11/03/16   [provider]  gabapentin (NEURONTIN) 300 MG capsule Take 2 capsules (600 mg total) by mouth 2 (two) times daily. 12/09/16   Plonk, Gwyndolyn Saxon, MD  levothyroxine (SYNTHROID, LEVOTHROID) 50 MCG tablet Take 50 mcg by mouth daily before breakfast.    [provider]  LORazepam (ATIVAN) 0.5 MG tablet Take 1 tablet (0.5 mg total) by mouth 2 (two) times daily. 03/20/16   Hillary Bow, MD  Multiple Vitamins-Minerals (PRESERVISION/LUTEIN) CAPS Take 1 capsule by mouth 2 (two) times daily.    [provider]  olopatadine (PATANOL) 0.1 % ophthalmic solution  11/03/16   [provider]    Allergies Amoxicillin-pot clavulanate; Cephalexin; Hydrochlorothiazide; Hydrocodone-chlorpheniramine; Hydrocodone-homatropine; Metronidazole; Moxifloxacin; Prednisone; Simvastatin; Sulfa antibiotics; Sulfasalazine; Tramadol; Celecoxib; Codeine; Cyclobenzaprine; Oxycodone-acetaminophen; and Penicillin v potassium  Family History  Problem Relation Age of Onset  . Testicular cancer Father     Social History Social History   Tobacco Use  . Smoking status: Never Smoker  . Smokeless tobacco: Never Used  Substance Use Topics  . Alcohol use: No  . Drug use: No    Review of Systems Constitutional: No fever/chills Eyes: No visual  changes. ENT: No sore throat. Cardiovascular: Denies chest pain. Respiratory: Denies shortness of breath. Gastrointestinal: No  abdominal pain.  No nausea, no vomiting.  No diarrhea.   Genitourinary: Negative for dysuria. Musculoskeletal: Positive for low back pain. Skin: Negative for rash. Neurological: Negative for headaches, focal weakness or numbness.  ____________________________________________   PHYSICAL EXAM:  VITAL SIGNS: ED Triage Vitals  Enc Vitals Group     BP 01/19/17 0852 (!) 173/75     Pulse Rate 01/19/17 0852 85     Resp 01/19/17 0852 20     Temp 01/19/17 0852 97.9 F (36.6 C)     Temp Source 01/19/17 0852 Oral     SpO2 01/19/17 0852 96 %     Weight --      Height --      Head Circumference --      Peak Flow --      Pain Score 01/19/17 0851 10   Constitutional: Alert and oriented.  Eyes: Conjunctivae are normal.  ENT   Head: Normocephalic and atraumatic.   Nose: No congestion/rhinnorhea.   Mouth/Throat: Mucous membranes are moist.   Neck: No stridor. Hematological/Lymphatic/Immunilogical: No cervical lymphadenopathy. Cardiovascular: Normal rate, regular rhythm.  No murmurs, rubs, or gallops.  Respiratory: Normal respiratory effort without tachypnea nor retractions. Breath sounds are clear and equal bilaterally. No wheezes/rales/rhonchi. Gastrointestinal: Soft and non tender. No rebound. No guarding.  Genitourinary: Deferred Musculoskeletal: Low back pain with manipulation. Neurologic:  Normal speech and language. No gross focal neurologic deficits are appreciated.  Skin:  Skin is warm, dry and intact. No rash noted. Psychiatric: Mood and affect are normal. Speech and behavior are normal. Patient exhibits appropriate insight and judgment.  ____________________________________________    LABS (pertinent positives/negatives)  None  ____________________________________________   EKG  None  ____________________________________________    RADIOLOGY  Lumbar spine L1 compression fracture  I, Chloe Baig, personally viewed and evaluated these images  (plain radiographs) as part of my medical decision making, as well as reviewing the written report by the radiologist.  ____________________________________________   PROCEDURES  Procedures  ____________________________________________   INITIAL IMPRESSION / [place] / ED COURSE  Pertinent labs & imaging results that were available during my care of the patient were reviewed by me and considered in my medical decision making (see chart for details).  Patient presents to the emergency department today because of concerns for low back pain after a fall a few days ago.  States she was seen at outside facility and had negative x-rays.  The patient had repeat x-rays done today which were concerning for an L1 compression fracture.  Given the compression fracture and the pain I did order a TSL O brace for the patient.  This was placed by the biotech representative.  I had a long discussion with patient and patient's companion.  He did voice concerns about the brace.  He stated that he would "try to convince her not to wear it".  I did have a discussion and try to speak mainly with the patient.  I tried to explain to her that I did think the brace would be the best option at this point to help give stability.  She is already on narcotics, Flexeril and steroids.  Recommended that she continues these medications.  I will give the patient orthopedic follow-up information.  In addition I ordered home health for the patient.  I discussed with case manager Malachy Mood who talked to advanced home who stated they would contact the  patient.  I discussed this plan with the patient.  _________________________________________   FINAL CLINICAL IMPRESSION(S) / ED DIAGNOSES  Final diagnoses:  Closed compression fracture of first lumbar vertebra, initial encounter [facility])     Note: This dictation was prepared with Dragon dictation. Any transcriptional errors that result from this process are  unintentional     Nance Pear, MD 01/19/17 [phone]

## 2017-01-19 NOTE — ED Notes (Signed)
Pt ambulated to toilet with brace on, pt tolerated well, RN instructed pt on brace use and importance of wearing brace, pt verbalized an understanding, friend at bedside raised voice and insisted pt not wearing brace,

## 2017-01-19 NOTE — Discharge Instructions (Signed)
Please seek medical attention for any high fevers, chest pain, shortness of breath, change in behavior, persistent vomiting, bloody stool or any other new or concerning symptoms.  

## 2017-01-19 NOTE — Care Management Note (Signed)
Case Management Note  Patient Details  Name: Alexandria Roman MRN: [id number] Date of Birth: 04-Jun-1929  Subjective/Objective:     Contacted the [facility]res. [facility] and he has accepted the referral for home PT and an aide for this patient. Called and made the MD aware of services set up.               Action/Plan:   Expected Discharge Date:                  Expected Discharge Plan:     In-House Referral:     Discharge planning Services     Post Acute Care Choice:    Choice offered to:     DME Arranged:    DME Agency:     HH Arranged:    HH Agency:     Status of Service:     If discussed at [employer] of Stay Meetings, dates discussed:    Additional Comments:  Beau Fanny, RN 01/19/2017, 1:27 PM

## 2017-01-19 NOTE — ED Notes (Addendum)
Call placed to [employer] for a brace for a L1 fracture, spoke with Bella Kennedy

## 2017-01-19 NOTE — ED Triage Notes (Signed)
Pt to ED by EMS with c/o of lower back pain that started after a fall on Sunday. Pt went [facility] on Monday and had x-rays. Pt given pain and anti-inflammatory RX.

## 2017-04-01 ENCOUNTER — Other Ambulatory Visit: Payer: Self-pay | Admitting: Orthopedic Surgery

## 2017-04-01 DIAGNOSIS — M48061 Spinal stenosis, lumbar region without neurogenic claudication: Secondary | ICD-10-CM

## 2017-04-11 ENCOUNTER — Ambulatory Visit: Payer: Medicare Other

## 2017-04-12 ENCOUNTER — Ambulatory Visit
Admission: RE | Admit: 2017-04-12 | Discharge: 2017-04-12 | Disposition: A | Discharge status: Home / Self Care | Payer: Medicare Other | Source: Ambulatory Visit | Attending: Orthopedic Surgery | Admitting: Orthopedic Surgery

## 2017-04-12 DIAGNOSIS — M48061 Spinal stenosis, lumbar region without neurogenic claudication: Secondary | ICD-10-CM | POA: Diagnosis not present

## 2017-04-12 DIAGNOSIS — M438X6 Other specified deforming dorsopathies, lumbar region: Secondary | ICD-10-CM | POA: Diagnosis not present

## 2017-04-12 DIAGNOSIS — Z9889 Other specified postprocedural states: Secondary | ICD-10-CM | POA: Diagnosis not present

## 2017-04-12 DIAGNOSIS — R29890 Loss of height: Secondary | ICD-10-CM | POA: Diagnosis not present

## 2017-04-12 DIAGNOSIS — M4316 Spondylolisthesis, lumbar region: Secondary | ICD-10-CM | POA: Insufficient documentation

## 2018-04-27 ENCOUNTER — Other Ambulatory Visit: Payer: Self-pay | Admitting: Emergency Medicine

## 2018-04-27 ENCOUNTER — Emergency Department
Admission: EM | Admit: 2018-04-27 | Discharge: 2018-04-27 | Disposition: A | Discharge status: Home / Self Care | Payer: Medicare Other | Attending: Emergency Medicine | Admitting: Emergency Medicine

## 2018-04-27 ENCOUNTER — Other Ambulatory Visit: Payer: Self-pay

## 2018-04-27 ENCOUNTER — Emergency Department: Payer: Medicare Other

## 2018-04-27 ENCOUNTER — Encounter: Payer: Self-pay | Admitting: Emergency Medicine

## 2018-04-27 DIAGNOSIS — Z79899 Other long term (current) drug therapy: Secondary | ICD-10-CM | POA: Insufficient documentation

## 2018-04-27 DIAGNOSIS — I129 Hypertensive chronic kidney disease with stage 1 through stage 4 chronic kidney disease, or unspecified chronic kidney disease: Secondary | ICD-10-CM | POA: Insufficient documentation

## 2018-04-27 DIAGNOSIS — N183 Chronic kidney disease, stage 3 (moderate): Secondary | ICD-10-CM | POA: Insufficient documentation

## 2018-04-27 DIAGNOSIS — R42 Dizziness and giddiness: Secondary | ICD-10-CM | POA: Diagnosis present

## 2018-04-27 LAB — URINALYSIS, COMPLETE (UACMP) WITH MICROSCOPIC
Bacteria, UA: NONE SEEN
Bilirubin Urine: NEGATIVE
GLUCOSE, UA: NEGATIVE mg/dL
HGB URINE DIPSTICK: NEGATIVE
Ketones, ur: 5 mg/dL — AB
Leukocytes,Ua: NEGATIVE
Nitrite: NEGATIVE
PH: 7 (ref 5.0–8.0)
Protein, ur: NEGATIVE mg/dL
SPECIFIC GRAVITY, URINE: 1.017 (ref 1.005–1.030)
SQUAMOUS EPITHELIAL / LPF: NONE SEEN (ref 0–5)

## 2018-04-27 MED ORDER — DIAZEPAM 2 MG PO TABS
2.0000 mg | ORAL_TABLET | Freq: Once | ORAL | Status: DC
  Filled 2018-04-27: qty 1

## 2018-04-27 MED ORDER — ONDANSETRON HCL 4 MG/2ML IJ SOLN
4.0000 mg | Freq: Once | INTRAMUSCULAR | Status: AC
  Administered 2018-04-27: 4 mg via INTRAVENOUS
  Filled 2018-04-27: qty 2

## 2018-04-27 MED ORDER — SODIUM CHLORIDE 0.9 % IV BOLUS
500.0000 mL | Freq: Once | INTRAVENOUS | Status: AC
  Administered 2018-04-27: 500 mL via INTRAVENOUS

## 2018-04-27 MED ORDER — DIAZEPAM 2 MG PO TABS: 2.0000 mg | ORAL_TABLET | Freq: Three times a day (TID) | ORAL | 0 refills | Status: DC | PRN

## 2018-04-27 MED ORDER — MECLIZINE HCL 25 MG PO TABS
25.0000 mg | ORAL_TABLET | Freq: Once | ORAL | Status: AC
  Administered 2018-04-27: 25 mg via ORAL
  Filled 2018-04-27: qty 1

## 2018-04-27 MED ORDER — MECLIZINE HCL 25 MG PO TABS: 25.0000 mg | ORAL_TABLET | Freq: Three times a day (TID) | ORAL | 0 refills | Status: DC | PRN

## 2018-04-27 MED ORDER — LORAZEPAM 0.5 MG PO TABS
0.5000 mg | ORAL_TABLET | Freq: Once | ORAL | Status: AC
  Administered 2018-04-27: 0.5 mg via ORAL
  Filled 2018-04-27: qty 1

## 2018-04-27 NOTE — ED Triage Notes (Signed)
PT to ER via EMS from home with c/o sudden onset nausea.  PT also c/o dizziness upon sitting up.  Pt states she has been treated for bronchitis.  Pt denies vomiting, pain or other c/o at this time.

## 2018-04-27 NOTE — ED Notes (Signed)
Tried to stand patient to ambulate and pt reported dizziness when sitting up and tilted to her R side trying to grab on to the rail.

## 2018-04-27 NOTE — ED Notes (Signed)
RN into pts room to recheck vitals prior to discharge and pt verbalized she has not seen the doctor yet. Pt requesting to see the doctor before discharge. MD made aware.

## 2018-04-27 NOTE — Discharge Instructions (Signed)
Please seek medical attention for any high fevers, chest pain, shortness of breath, change in behavior, persistent vomiting, bloody stool or any other new or concerning symptoms.  

## 2018-04-27 NOTE — ED Provider Notes (Signed)
-----------------------------------------   7:05 PM on 04/27/2018 -----------------------------------------  I took over care of this patient from Dr. Archie Balboa.  She presented with nausea and dizziness, with concern for peripheral vertigo.  She was pending MRI and reassessment after medication.  MRI shows no acute intracranial findings.  The patient initially had some persistent nausea but after additional antiemetic she is feeling much better.  She was able to ambulate around the room without difficulty.  She is tolerating p.o. at this time.  She feels comfortable going home.  I discussed the results of the work-up with her and her husband.  Return precautions given, and they expressed understanding.   Arta Silence, MD 04/27/18 1907

## 2018-04-27 NOTE — ED Provider Notes (Signed)
[facility] Emergency Department Provider Note   ____________________________________________   I have reviewed the triage vital signs and the nursing notes.   HISTORY  Chief Complaint Nausea, dizziness  History limited by: Not Limited   HPI Alexandria Roman is a 85 y.o. female who presents to the emergency department today because of concerns for nausea and dizziness.  Patient states the symptoms started this morning.  They have been constant throughout the morning.  They are worse with movement.  She denies any associated vomiting or abdominal pain.  She denies similar symptoms in the past.  She denies any headaches or fevers.  No unusual ingestions.  Denies any known sick contacts.  Per medical record review patient has a history of HTN  Past Medical History:  Diagnosis Date  . Cancer ([place])   . Hypertension     Patient Active Problem List   Diagnosis Date Noted  . Depression with anxiety 12/09/2016  . Numbness of foot 12/09/2016  . Macular degeneration 12/09/2016  . Gait instability 12/08/2016  . Upper respiratory infection, viral 12/06/2016  . Primary osteoarthritis of both knees 07/20/2016  . Postmenopausal osteoporosis 04/15/2016  . Chest pain 03/19/2016  . Spinal stenosis 08/04/2015  . Myalgia 06/11/2015  . Polyarthralgia 06/11/2015  . Chronic midline low back pain with bilateral sciatica 01/10/2015  . Benign essential hypertension 06/04/2014  . CKD (chronic kidney disease) stage 3, GFR 30-59 ml/min (HCC) 06/04/2014  . Mixed hyperlipidemia 06/04/2014  . Other allergic rhinitis 06/04/2014  . DDD (degenerative disc disease), lumbar 09/07/2013  . Lumbar radiculitis 09/07/2013  . Hypothyroidism, unspecified 08/02/2013    Past Surgical History:  Procedure Laterality Date  . ABDOMINAL HYSTERECTOMY      Prior to Admission medications   Medication Sig Start Date End Date Taking? Authorizing Provider  Cholecalciferol (VITAMIN D3) 1000 units  CAPS Take 1 capsule (1,000 Units total) by mouth daily. 12/09/16   Plonk, Gwyndolyn Saxon, MD  cyclobenzaprine (FLEXERIL) 10 MG tablet Take 10 mg by mouth 3 (three) times daily as needed for muscle spasms.    [provider]  DULoxetine (CYMBALTA) 30 MG capsule Take 1 capsule by mouth 2 (two) times daily. 11/03/16   [provider]  gabapentin (NEURONTIN) 300 MG capsule Take 2 capsules (600 mg total) by mouth 2 (two) times daily. 12/09/16   Plonk, Gwyndolyn Saxon, MD  HYDROcodone-acetaminophen (NORCO/VICODIN) 5-325 MG tablet Take 1 tablet by mouth every 6 (six) hours as needed for moderate pain.    [provider]  levothyroxine (SYNTHROID, LEVOTHROID) 50 MCG tablet Take 50 mcg by mouth daily before breakfast.    [provider]  LORazepam (ATIVAN) 0.5 MG tablet Take 1 tablet (0.5 mg total) by mouth 2 (two) times daily. 03/20/16   Hillary Bow, MD  losartan (COZAAR) 25 MG tablet Take 1 tablet by mouth daily. 01/05/17   [provider]  methylPREDNISolone (MEDROL DOSEPAK) 4 MG TBPK tablet Take 4-24 mg by mouth daily. Taper daily as directed for 6 days    [provider]  Multiple Vitamins-Minerals (PRESERVISION/LUTEIN) CAPS Take 1 capsule by mouth 2 (two) times daily.    [provider]  olopatadine (PATANOL) 0.1 % ophthalmic solution Place 1 drop into both eyes 2 (two) times daily.  11/03/16   [provider]  pravastatin (PRAVACHOL) 40 MG tablet Take 40 mg by mouth daily.    [provider]    Allergies Amoxicillin-pot clavulanate; Cephalexin; Hydrochlorothiazide; Hydrocodone-chlorpheniramine; Hydrocodone-homatropine; Metronidazole; Moxifloxacin; Prednisone; Simvastatin; Sulfa antibiotics; Sulfasalazine;  Tramadol; Celecoxib; Codeine; Cyclobenzaprine; Oxycodone-acetaminophen; and Penicillin v potassium  Family History  Problem Relation Age of Onset  . Testicular cancer Father     Social History Social History   Tobacco Use  .  Smoking status: Never Smoker  . Smokeless tobacco: Never Used  Substance Use Topics  . Alcohol use: No  . Drug use: No    Review of Systems Constitutional: No fever/chills Eyes: No visual changes. ENT: No sore throat. Cardiovascular: Denies chest pain. Respiratory: Denies shortness of breath. Gastrointestinal: No abdominal pain.  No nausea, no vomiting.  No diarrhea.   Genitourinary: Negative for dysuria. Musculoskeletal: Negative for back pain. Skin: Negative for rash. Neurological: Positive for dizziness.   ____________________________________________   PHYSICAL EXAM:  VITAL SIGNS: ED Triage Vitals  Enc Vitals Group     BP 162/86     Pulse 87     Resp 18     Temp 96.2     Temp src      SpO2 95   Constitutional: Alert and oriented.  Eyes: Conjunctivae are normal.  ENT      Head: Normocephalic and atraumatic.      Nose: No congestion/rhinnorhea.      Mouth/Throat: Mucous membranes are moist.      Neck: No stridor. Hematological/Lymphatic/Immunilogical: No cervical lymphadenopathy. Cardiovascular: Normal rate, regular rhythm.  No murmurs, rubs, or gallops.  Respiratory: Normal respiratory effort without tachypnea nor retractions. Breath sounds are clear and equal bilaterally. No wheezes/rales/rhonchi. Gastrointestinal: Soft and non tender. No rebound. No guarding.  Genitourinary: Deferred Musculoskeletal: Normal range of motion in all extremities. No lower extremity edema. Neurologic:  Normal speech. Difficulty with balance. Moving all extremities.  Skin:  Skin is warm, dry and intact. No rash noted. Psychiatric: Mood and affect are normal. Speech and behavior are normal. Patient exhibits appropriate insight and judgment.  ____________________________________________    LABS (pertinent positives/negatives)  Downtime labs reviewed BMP wnl except glu 169 CBC wbc 8.8, hgb 14.3, plt 250 Trop <0.03  ____________________________________________   EKG  I,  Nance Pear, attending physician, personally viewed and interpreted this EKG  EKG Time: 1032 Rate: 86 Rhythm: sinus rhythm Axis: normal Intervals: qtc 468 QRS: narrow ST changes: no st elevation Impression: normal ekg   ____________________________________________    RADIOLOGY  MR pending at time of sign out  ____________________________________________   PROCEDURES  Procedures  ____________________________________________   INITIAL IMPRESSION / ASSESSMENT AND PLAN / ED COURSE  Pertinent labs & imaging results that were available during my care of the patient were reviewed by me and considered in my medical decision making (see chart for details).   Patient presented to the emergency department today because of concerns for dizziness and nausea.  Patient states that the symptoms started this morning.  On exam patient does have some imbalance with standing.  It does get more dizzy with movement.  This point I think vertigo likely.  Patient however did not get significant relief from meclizine.  Continues to be imbalanced.  Will plan on getting an MRI to evaluate for any stroke. Will try valium for dizziness.   ____________________________________________   FINAL CLINICAL IMPRESSION(S) / ED DIAGNOSES  Final diagnoses:  Dizziness     Note: This dictation was prepared with Dragon dictation. Any transcriptional errors that result from this process are unintentional     Nance Pear, MD 04/27/18 1454

## 2018-05-19 ENCOUNTER — Inpatient Hospital Stay
Admission: EM | Admit: 2018-05-19 | Discharge: 2018-05-23 | DRG: 469 | Disposition: A | Discharge status: Skilled Nursing Facility | Payer: Medicare Other | Attending: Internal Medicine | Admitting: Internal Medicine

## 2018-05-19 ENCOUNTER — Other Ambulatory Visit: Payer: Self-pay

## 2018-05-19 ENCOUNTER — Emergency Department: Payer: Medicare Other

## 2018-05-19 DIAGNOSIS — S72041A Displaced fracture of base of neck of right femur, initial encounter for closed fracture: Principal | ICD-10-CM | POA: Diagnosis present

## 2018-05-19 DIAGNOSIS — R0600 Dyspnea, unspecified: Secondary | ICD-10-CM

## 2018-05-19 DIAGNOSIS — M810 Age-related osteoporosis without current pathological fracture: Secondary | ICD-10-CM | POA: Diagnosis present

## 2018-05-19 DIAGNOSIS — Z79890 Hormone replacement therapy (postmenopausal): Secondary | ICD-10-CM | POA: Diagnosis not present

## 2018-05-19 DIAGNOSIS — E782 Mixed hyperlipidemia: Secondary | ICD-10-CM | POA: Diagnosis present

## 2018-05-19 DIAGNOSIS — Y92019 Unspecified place in single-family (private) house as the place of occurrence of the external cause: Secondary | ICD-10-CM | POA: Diagnosis not present

## 2018-05-19 DIAGNOSIS — Z7951 Long term (current) use of inhaled steroids: Secondary | ICD-10-CM

## 2018-05-19 DIAGNOSIS — E876 Hypokalemia: Secondary | ICD-10-CM | POA: Diagnosis not present

## 2018-05-19 DIAGNOSIS — E039 Hypothyroidism, unspecified: Secondary | ICD-10-CM | POA: Diagnosis present

## 2018-05-19 DIAGNOSIS — T8859XA Other complications of anesthesia, initial encounter: Secondary | ICD-10-CM | POA: Diagnosis not present

## 2018-05-19 DIAGNOSIS — Z885 Allergy status to narcotic agent status: Secondary | ICD-10-CM

## 2018-05-19 DIAGNOSIS — Z888 Allergy status to other drugs, medicaments and biological substances status: Secondary | ICD-10-CM | POA: Diagnosis not present

## 2018-05-19 DIAGNOSIS — N189 Chronic kidney disease, unspecified: Secondary | ICD-10-CM | POA: Diagnosis present

## 2018-05-19 DIAGNOSIS — I129 Hypertensive chronic kidney disease with stage 1 through stage 4 chronic kidney disease, or unspecified chronic kidney disease: Secondary | ICD-10-CM | POA: Diagnosis present

## 2018-05-19 DIAGNOSIS — Z880 Allergy status to penicillin: Secondary | ICD-10-CM

## 2018-05-19 DIAGNOSIS — Z881 Allergy status to other antibiotic agents status: Secondary | ICD-10-CM | POA: Diagnosis not present

## 2018-05-19 DIAGNOSIS — E8770 Fluid overload, unspecified: Secondary | ICD-10-CM | POA: Diagnosis not present

## 2018-05-19 DIAGNOSIS — J9601 Acute respiratory failure with hypoxia: Secondary | ICD-10-CM | POA: Diagnosis not present

## 2018-05-19 DIAGNOSIS — M5136 Other intervertebral disc degeneration, lumbar region: Secondary | ICD-10-CM | POA: Diagnosis present

## 2018-05-19 DIAGNOSIS — Z882 Allergy status to sulfonamides status: Secondary | ICD-10-CM | POA: Diagnosis not present

## 2018-05-19 DIAGNOSIS — Z809 Family history of malignant neoplasm, unspecified: Secondary | ICD-10-CM

## 2018-05-19 DIAGNOSIS — Z8542 Personal history of malignant neoplasm of other parts of uterus: Secondary | ICD-10-CM | POA: Diagnosis not present

## 2018-05-19 DIAGNOSIS — Z79899 Other long term (current) drug therapy: Secondary | ICD-10-CM | POA: Diagnosis not present

## 2018-05-19 DIAGNOSIS — S72001A Fracture of unspecified part of neck of right femur, initial encounter for closed fracture: Secondary | ICD-10-CM | POA: Diagnosis present

## 2018-05-19 DIAGNOSIS — S72009A Fracture of unspecified part of neck of unspecified femur, initial encounter for closed fracture: Secondary | ICD-10-CM | POA: Diagnosis present

## 2018-05-19 DIAGNOSIS — W010XXA Fall on same level from slipping, tripping and stumbling without subsequent striking against object, initial encounter: Secondary | ICD-10-CM | POA: Diagnosis present

## 2018-05-19 LAB — COMPREHENSIVE METABOLIC PANEL
ALT: 12 U/L (ref 0–44)
AST: 19 U/L (ref 15–41)
Albumin: 4.1 g/dL (ref 3.5–5.0)
Alkaline Phosphatase: 54 U/L (ref 38–126)
Anion gap: 12 (ref 5–15)
BUN: 15 mg/dL (ref 8–23)
CO2: 22 mmol/L (ref 22–32)
Calcium: 8.9 mg/dL (ref 8.9–10.3)
Chloride: 103 mmol/L (ref 98–111)
Creatinine, Ser: 0.88 mg/dL (ref 0.44–1.00)
GFR calc Af Amer: >60 mL/min (ref >60)
GFR calc non Af Amer: 59 mL/min — ABNORMAL LOW (ref >60)
Glucose, Bld: 107 mg/dL — ABNORMAL HIGH (ref 70–99)
Potassium: 3.3 mmol/L — ABNORMAL LOW (ref 3.5–5.1)
Sodium: 137 mmol/L (ref 135–145)
Total Bilirubin: 0.7 mg/dL (ref 0.3–1.2)
Total Protein: 6.9 g/dL (ref 6.5–8.1)

## 2018-05-19 LAB — CBC WITH DIFFERENTIAL/PLATELET
Abs Immature Granulocytes: 0.04 10*3/uL (ref 0.00–0.07)
Basophils Absolute: 0.1 10*3/uL (ref 0.0–0.1)
Basophils Relative: 1 %
Eosinophils Absolute: 0.1 10*3/uL (ref 0.0–0.5)
Eosinophils Relative: 1 %
HCT: 42.3 % (ref 36.0–46.0)
Hemoglobin: 14.3 g/dL (ref 12.0–15.0)
Immature Granulocytes: 0 %
Lymphocytes Relative: 21 %
Lymphs Abs: 2.1 10*3/uL (ref 0.7–4.0)
MCH: 29.5 pg (ref 26.0–34.0)
MCHC: 33.8 g/dL (ref 30.0–36.0)
MCV: 87.4 fL (ref 80.0–100.0)
Monocytes Absolute: 0.9 10*3/uL (ref 0.1–1.0)
Monocytes Relative: 9 %
Neutro Abs: 6.7 10*3/uL (ref 1.7–7.7)
Neutrophils Relative %: 68 %
Platelets: 280 10*3/uL (ref 150–400)
RBC: 4.84 MIL/uL (ref 3.87–5.11)
RDW: 14.4 % (ref 11.5–15.5)
WBC: 9.9 10*3/uL (ref 4.0–10.5)
nRBC: 0 % (ref 0.0–0.2)

## 2018-05-19 LAB — TSH: TSH: 8.782 u[IU]/mL — ABNORMAL HIGH (ref 0.350–4.500)

## 2018-05-19 MED ORDER — SODIUM CHLORIDE 0.9 % IV SOLN
INTRAVENOUS | Status: DC
  Administered 2018-05-19: 23:00:00 via INTRAVENOUS

## 2018-05-19 MED ORDER — ONDANSETRON HCL 4 MG PO TABS: 4.0000 mg | ORAL_TABLET | Freq: Four times a day (QID) | ORAL | Status: DC | PRN

## 2018-05-19 MED ORDER — MORPHINE SULFATE (PF) 2 MG/ML IV SOLN: 1.0000 mg | INTRAVENOUS | Status: DC | PRN

## 2018-05-19 MED ORDER — DOCUSATE SODIUM 100 MG PO CAPS
100.0000 mg | ORAL_CAPSULE | Freq: Two times a day (BID) | ORAL | Status: DC
  Administered 2018-05-19: 100 mg via ORAL
  Filled 2018-05-19: qty 1

## 2018-05-19 MED ORDER — ONDANSETRON HCL 4 MG/2ML IJ SOLN: 4.0000 mg | Freq: Four times a day (QID) | INTRAMUSCULAR | Status: DC | PRN

## 2018-05-19 MED ORDER — ACETAMINOPHEN 650 MG RE SUPP: 650.0000 mg | Freq: Four times a day (QID) | RECTAL | Status: DC | PRN

## 2018-05-19 MED ORDER — ACETAMINOPHEN 325 MG PO TABS: 650.0000 mg | ORAL_TABLET | Freq: Four times a day (QID) | ORAL | Status: DC | PRN

## 2018-05-19 MED ORDER — ONDANSETRON HCL 4 MG/2ML IJ SOLN
4.0000 mg | Freq: Once | INTRAMUSCULAR | Status: AC
  Administered 2018-05-19: 4 mg via INTRAVENOUS
  Filled 2018-05-19: qty 2

## 2018-05-19 MED ORDER — HYDROCODONE-ACETAMINOPHEN 5-325 MG PO TABS
1.0000 | ORAL_TABLET | Freq: Four times a day (QID) | ORAL | Status: DC | PRN
  Administered 2018-05-20 (×2): 1 via ORAL
  Filled 2018-05-19 (×2): qty 1

## 2018-05-19 MED ORDER — SODIUM CHLORIDE 0.9 % IV BOLUS: 500.0000 mL | Freq: Once | INTRAVENOUS | Status: DC

## 2018-05-19 MED ORDER — MORPHINE SULFATE (PF) 4 MG/ML IV SOLN
4.0000 mg | Freq: Once | INTRAVENOUS | Status: AC
  Administered 2018-05-19: 4 mg via INTRAVENOUS
  Filled 2018-05-19: qty 1

## 2018-05-19 MED ORDER — LORAZEPAM 0.5 MG PO TABS
0.5000 mg | ORAL_TABLET | Freq: Two times a day (BID) | ORAL | Status: DC
  Administered 2018-05-19 – 2018-05-23 (×5): 0.5 mg via ORAL
  Filled 2018-05-19 (×5): qty 1

## 2018-05-19 MED ORDER — OLOPATADINE HCL 0.1 % OP SOLN
1.0000 [drp] | Freq: Two times a day (BID) | OPHTHALMIC | Status: DC
  Administered 2018-05-19 – 2018-05-23 (×6): 1 [drp] via OPHTHALMIC
  Filled 2018-05-19: qty 5

## 2018-05-19 MED ORDER — LEVOTHYROXINE SODIUM 50 MCG PO TABS: 50.0000 ug | ORAL_TABLET | Freq: Every day | ORAL | Status: DC

## 2018-05-19 MED ORDER — LOSARTAN POTASSIUM 25 MG PO TABS
25.0000 mg | ORAL_TABLET | Freq: Every day | ORAL | Status: DC
  Administered 2018-05-21 – 2018-05-23 (×3): 25 mg via ORAL
  Filled 2018-05-19 (×3): qty 1

## 2018-05-19 MED ORDER — VITAMIN D 25 MCG (1000 UNIT) PO TABS
1000.0000 [IU] | ORAL_TABLET | Freq: Every day | ORAL | Status: DC
  Administered 2018-05-21 – 2018-05-23 (×3): 1000 [IU] via ORAL
  Filled 2018-05-19 (×3): qty 1

## 2018-05-19 NOTE — ED Notes (Signed)
ED TO INPATIENT HANDOFF REPORT  ED Nurse Name and Phone #: Terri Piedra [phone]  S Name/Age/Gender Alexandria Roman 85 y.o. female Room/Bed: ED26A/ED26A  Code Status   Code Status: Prior  Home/SNF/Other Home Patient oriented to: self, place, time and situation Is this baseline? Yes   Triage Complete: Triage complete  Chief Complaint Knee Pain  Triage Note Pt to ed via ems from home. Pt states she was walking slipped and fell landing on her right side hitting her face on carpet flooring., Pt denies blood thinner use. Pt c/o right leg pain. Pt did not lose consciousness. VSS. Pt was given 67mcg fentanyl with EMS   Allergies Allergies  Allergen Reactions  . Amoxicillin-Pot Clavulanate Diarrhea  . Cephalexin Other (See Comments) and Nausea And Vomiting  . Hydrochlorothiazide Other (See Comments) and Nausea And Vomiting  . Hydrocodone-Chlorpheniramine Other (See Comments)  . Hydrocodone-Homatropine Other (See Comments)  . Metronidazole Other (See Comments)    Other reaction(s): Other (See Comments) Other Reaction: Not Assessed Other Reaction: Not Assessed  . Moxifloxacin Other (See Comments)  . Prednisone Other (See Comments)  . Simvastatin Other (See Comments)  . Sulfa Antibiotics Other (See Comments)    Other reaction(s): Unknown  . Sulfasalazine Other (See Comments)    Other reaction(s): Unknown  . Tramadol Other (See Comments)  . Celecoxib Nausea And Vomiting  . Codeine Nausea Only    Other reaction(s): Other (See Comments) Other reaction(s): Unknown  . Cyclobenzaprine Other (See Comments) and Nausea Only    Other reaction(s): Other (See Comments)  . Oxycodone-Acetaminophen Nausea Only and Nausea And Vomiting  . Penicillin V Potassium Rash    Other reaction(s): Other (See Comments) Other reaction(s): Unknown    Level of Care/Admitting Diagnosis ED Disposition    ED Disposition Condition [facility] Area: [place] [[id number]]  Level of Care: Med-Surg [16]  Diagnosis: Hip fracture [facility]) [[id number]]  Admitting Physician: Harrie Foreman [[id number]]  Attending Physician: Harrie Foreman [[id number]]  Estimated length of stay: past midnight tomorrow  Certification:: I certify this patient will need inpatient services for at least 2 midnights  PT Class (Do Not Modify): Inpatient [101]  PT Acc Code (Do Not Modify): Private [1]       B Medical/Surgery History Past Medical History:  Diagnosis Date  . Cancer ([place])   . Hypertension    Past Surgical History:  Procedure Laterality Date  . ABDOMINAL HYSTERECTOMY       A IV Location/Drains/Wounds Patient Lines/Drains/Airways Status   Active Line/Drains/Airways    Name:   Placement date:   Placement time:   Site:   Days:   Peripheral IV 04/27/18 Right Antecubital   04/27/18    1033    Antecubital   22   Peripheral IV 04/27/18 Left Antecubital   04/27/18    1558    Antecubital   22   Peripheral IV 05/19/18 Left Antecubital   05/19/18    1931    Antecubital   less than 1   Urethral Catheter gracie rn Double-lumen 16 Fr.   05/19/18    2138    Double-lumen   less than 1   External Urinary Catheter   04/27/18    1643    -   22          Intake/Output Last 24 hours No intake or output data in the 24 hours ending 05/19/18 2140  Labs/Imaging Results for orders placed or performed during the hospital  encounter of 05/19/18 (from the past 48 hour(s))  CBC with Differential     Status: None   Collection Time: 05/19/18  7:31 PM  Result Value Ref Range   WBC 9.9 4.0 - 10.5 K/uL   RBC 4.84 3.87 - 5.11 MIL/uL   Hemoglobin 14.3 12.0 - 15.0 g/dL   HCT 42.3 36.0 - 46.0 %   MCV 87.4 80.0 - 100.0 fL   MCH 29.5 26.0 - 34.0 pg   MCHC 33.8 30.0 - 36.0 g/dL   RDW 14.4 11.5 - 15.5 %   Platelets 280 150 - 400 K/uL   nRBC 0.0 0.0 - 0.2 %   Neutrophils Relative % 68 %   Neutro Abs 6.7 1.7 - 7.7 K/uL   Lymphocytes Relative 21 %   Lymphs Abs 2.1 0.7 - 4.0 K/uL   Monocytes  Relative 9 %   Monocytes Absolute 0.9 0.1 - 1.0 K/uL   Eosinophils Relative 1 %   Eosinophils Absolute 0.1 0.0 - 0.5 K/uL   Basophils Relative 1 %   Basophils Absolute 0.1 0.0 - 0.1 K/uL   Immature Granulocytes 0 %   Abs Immature Granulocytes 0.04 0.00 - 0.07 K/uL    Comment: Performed at [facility], [address] [place], [place] [address]  Comprehensive metabolic panel     Status: Abnormal   Collection Time: 05/19/18  7:31 PM  Result Value Ref Range   Sodium 137 135 - 145 mmol/L   Potassium 3.3 (L) 3.5 - 5.1 mmol/L   Chloride 103 98 - 111 mmol/L   CO2 22 22 - 32 mmol/L   Glucose, Bld 107 (H) 70 - 99 mg/dL   BUN 15 8 - 23 mg/dL   Creatinine, Ser 0.88 0.44 - 1.00 mg/dL   Calcium 8.9 8.9 - 10.3 mg/dL   Total Protein 6.9 6.5 - 8.1 g/dL   Albumin 4.1 3.5 - 5.0 g/dL   AST 19 15 - 41 U/L   ALT 12 0 - 44 U/L   Alkaline Phosphatase 54 38 - 126 U/L   Total Bilirubin 0.7 0.3 - 1.2 mg/dL   GFR calc non Af Amer 59 (L) >60 mL/min   GFR calc Af Amer >60 >60 mL/min   Anion gap 12 5 - 15    Comment: Performed at [facility], [address] [place], [place] [address]   Dg Chest 1 View  Result Date: 05/19/2018 CLINICAL DATA:  Fall earlier this evening. EXAM: CHEST  1 VIEW COMPARISON:  March 19, 2016 FINDINGS: A hiatal hernia is identified. The cardiomediastinal silhouette is stable. No pneumothorax. The lungs are clear. IMPRESSION: No active disease. Electronically Signed   By: Dorise Bullion III M.D   On: 05/19/2018 20:09   Dg Hip Unilat W Or Wo Pelvis 2-3 Views Right  Result Date: 05/19/2018 CLINICAL DATA:  Right hip pain after fall. EXAM: DG HIP (WITH OR WITHOUT PELVIS) 2-3V RIGHT COMPARISON:  None. FINDINGS: There is a fracture through the base of the femoral neck on the right. The left hip is intact on a single frontal view. The remainder of the pelvic bones are intact. No dislocation noted. IMPRESSION: Fracture through the base of the right femoral neck.  Electronically Signed   By: Dorise Bullion III M.D   On: 05/19/2018 20:08    Pending Labs Unresulted Labs (From admission, onward)    Start     Ordered   Signed and Held  TSH  Add-on,   R  Signed and Held          Vitals/Pain Today's Vitals   05/19/18 1924 05/19/18 1925 05/19/18 1927 05/19/18 1930  BP:   (!) 168/97 (!) 158/95  Pulse:   100   Resp:   16 13  Temp:   98.1 F (36.7 C)   TempSrc:   Oral   SpO2:   99%   Weight:  77.1 kg    Height:  5\' 4"  (1.626 m)    PainSc: 10-Worst pain ever       Isolation Precautions No active isolations  Medications Medications  morphine 4 MG/ML injection 4 mg (4 mg Intravenous Given 05/19/18 2130)  ondansetron (ZOFRAN) injection 4 mg (4 mg Intravenous Given 05/19/18 2130)  sodium chloride 0.9 % bolus 500 mL (500 mLs Intravenous New Bag/Given 05/19/18 2132)    Mobility walks with device Moderate fall risk   Focused Assessments musculoskeletal   R Recommendations: See Admitting Provider Note  Report given to:   Additional Notes: fractured right hip

## 2018-05-19 NOTE — ED Provider Notes (Signed)
[facility] Emergency Department Provider Note  ____________________________________________  Time seen: Approximately 9:14 PM  I have reviewed the triage vital signs and the nursing notes.   HISTORY  Chief Complaint Fall and Leg Pain    HPI Alexandria Roman is a 85 y.o. female with a history of hypertension CKD and degenerative disc disease as well as unsteady gait who reports that she was walking across her living room today, not using her walker, and after placing a card on a table that she intended to mail, she turned to walk back across the room and lost her balance and fell onto her right side hitting her hip and her face.  No loss of consciousness.  Denies neck pain.  Right hip pain is severe, worse with movement, no alleviating factors.  Nonradiating.  EMS gave 50 mcg of fentanyl prior to arrival.      Past Medical History:  Diagnosis Date  . Cancer ([place])   . Hypertension      Patient Active Problem List   Diagnosis Date Noted  . Depression with anxiety 12/09/2016  . Numbness of foot 12/09/2016  . Macular degeneration 12/09/2016  . Gait instability 12/08/2016  . Upper respiratory infection, viral 12/06/2016  . Primary osteoarthritis of both knees 07/20/2016  . Postmenopausal osteoporosis 04/15/2016  . Chest pain 03/19/2016  . Spinal stenosis 08/04/2015  . Myalgia 06/11/2015  . Polyarthralgia 06/11/2015  . Chronic midline low back pain with bilateral sciatica 01/10/2015  . Benign essential hypertension 06/04/2014  . CKD (chronic kidney disease) stage 3, GFR 30-59 ml/min (HCC) 06/04/2014  . Mixed hyperlipidemia 06/04/2014  . Other allergic rhinitis 06/04/2014  . DDD (degenerative disc disease), lumbar 09/07/2013  . Lumbar radiculitis 09/07/2013  . Hypothyroidism, unspecified 08/02/2013     Past Surgical History:  Procedure Laterality Date  . ABDOMINAL HYSTERECTOMY       Prior to Admission medications   Medication Sig Start Date  End Date Taking? Authorizing Provider  Cholecalciferol (VITAMIN D3) 1000 units CAPS Take 1 capsule (1,000 Units total) by mouth daily. 12/09/16   Plonk, Gwyndolyn Saxon, MD  cyclobenzaprine (FLEXERIL) 10 MG tablet Take 10 mg by mouth 3 (three) times daily as needed for muscle spasms.    [provider]  DULoxetine (CYMBALTA) 30 MG capsule Take 1 capsule by mouth 2 (two) times daily. 11/03/16   [provider]  gabapentin (NEURONTIN) 300 MG capsule Take 2 capsules (600 mg total) by mouth 2 (two) times daily. 12/09/16   Plonk, Gwyndolyn Saxon, MD  HYDROcodone-acetaminophen (NORCO/VICODIN) 5-325 MG tablet Take 1 tablet by mouth every 6 (six) hours as needed for moderate pain.    [provider]  levothyroxine (SYNTHROID, LEVOTHROID) 50 MCG tablet Take 50 mcg by mouth daily before breakfast.    [provider]  LORazepam (ATIVAN) 0.5 MG tablet Take 1 tablet (0.5 mg total) by mouth 2 (two) times daily. 03/20/16   Hillary Bow, MD  losartan (COZAAR) 25 MG tablet Take 1 tablet by mouth daily. 01/05/17   [provider]  meclizine (ANTIVERT) 25 MG tablet Take 1 tablet (25 mg total) by mouth 3 (three) times daily as needed for dizziness. 04/27/18   Arta Silence, MD  methylPREDNISolone (MEDROL DOSEPAK) 4 MG TBPK tablet Take 4-24 mg by mouth daily. Taper daily as directed for 6 days    [provider]  Multiple Vitamins-Minerals (PRESERVISION/LUTEIN) CAPS Take 1 capsule by mouth 2 (two) times daily.    [provider]  olopatadine (PATANOL) 0.1 %  ophthalmic solution Place 1 drop into both eyes 2 (two) times daily.  11/03/16   [provider]  pravastatin (PRAVACHOL) 40 MG tablet Take 40 mg by mouth daily.    [provider]     Allergies Amoxicillin-pot clavulanate; Cephalexin; Hydrochlorothiazide; Hydrocodone-chlorpheniramine; Hydrocodone-homatropine; Metronidazole; Moxifloxacin; Prednisone; Simvastatin; Sulfa antibiotics; Sulfasalazine;  Tramadol; Celecoxib; Codeine; Cyclobenzaprine; Oxycodone-acetaminophen; and Penicillin v potassium   Family History  Problem Relation Age of Onset  . Testicular cancer Father     Social History Social History   Tobacco Use  . Smoking status: Never Smoker  . Smokeless tobacco: Never Used  Substance Use Topics  . Alcohol use: No  . Drug use: No    Review of Systems  Constitutional:   No fever or chills.  ENT:   No sore throat. No rhinorrhea. Cardiovascular:   No chest pain or syncope. Respiratory:   No dyspnea or cough.  Chronic congested feeling for the past month. Gastrointestinal:   Negative for abdominal pain, vomiting and diarrhea.  Musculoskeletal:   Right hip pain as above. All other systems reviewed and are negative except as documented above in ROS and HPI.  ____________________________________________   PHYSICAL EXAM:  VITAL SIGNS: ED Triage Vitals  Enc Vitals Group     BP 05/19/18 1927 (!) 168/97     Pulse Rate 05/19/18 1927 100     Resp 05/19/18 1927 16     Temp 05/19/18 1927 98.1 F (36.7 C)     Temp Source 05/19/18 1927 Oral     SpO2 05/19/18 1927 99 %     Weight 05/19/18 1925 169 lb 15.6 oz (77.1 kg)     Height 05/19/18 1925 5\' 4"  (1.626 m)     Head Circumference --      Peak Flow --      Pain Score 05/19/18 1924 10     Pain Loc --      Pain Edu? --      Excl. in [place]? --     Vital signs reviewed, nursing assessments reviewed.   Constitutional:   Alert and oriented. Non-toxic appearance. Eyes:   Conjunctivae are normal. EOMI. PERRL. ENT      Head:   Normocephalic with contusion of the right forehead and right maxilla without depression or point tenderness.  No laceration.      Nose:   No congestion/rhinnorhea.       Mouth/Throat:   MMM, no pharyngeal erythema. No peritonsillar mass.       Neck:   No meningismus. Full ROM.  No midline tenderness. Hematological/Lymphatic/Immunilogical:   No cervical lymphadenopathy. Cardiovascular:   RRR.  Symmetric bilateral radial and DP pulses.  No murmurs. Cap refill less than 2 seconds. Respiratory:   Normal respiratory effort without tachypnea/retractions. Breath sounds are clear and equal bilaterally. No wheezes/rales/rhonchi. Gastrointestinal:   Soft and nontender. Non distended. There is no CVA tenderness.  No rebound, rigidity, or guarding.  Musculoskeletal:   Normal range of motion in all extremities. No joint effusions.  Tenderness at the right hip.  Pain worsened by rotation of the right leg. Neurologic:   Normal speech and language.  Motor grossly intact. No acute focal neurologic deficits are appreciated.  Skin:    Skin is warm, dry and intact. No rash noted.  No petechiae, purpura, or bullae.  ____________________________________________    LABS (pertinent positives/negatives) (all labs ordered are listed, but only abnormal results are displayed) Labs Reviewed  COMPREHENSIVE METABOLIC PANEL - Abnormal; Notable for the following  components:      Result Value   Potassium 3.3 (*)    Glucose, Bld 107 (*)    GFR calc non Af Amer 59 (*)    All other components within normal limits  CBC WITH DIFFERENTIAL/PLATELET   ____________________________________________   EKG    ____________________________________________    RADIOLOGY  Dg Chest 1 View  Result Date: 05/19/2018 CLINICAL DATA:  Fall earlier this evening. EXAM: CHEST  1 VIEW COMPARISON:  March 19, 2016 FINDINGS: A hiatal hernia is identified. The cardiomediastinal silhouette is stable. No pneumothorax. The lungs are clear. IMPRESSION: No active disease. Electronically Signed   By: Dorise Bullion III M.D   On: 05/19/2018 20:09   Dg Hip Unilat W Or Wo Pelvis 2-3 Views Right  Result Date: 05/19/2018 CLINICAL DATA:  Right hip pain after fall. EXAM: DG HIP (WITH OR WITHOUT PELVIS) 2-3V RIGHT COMPARISON:  None. FINDINGS: There is a fracture through the base of the femoral neck on the right. The left hip is intact on a  single frontal view. The remainder of the pelvic bones are intact. No dislocation noted. IMPRESSION: Fracture through the base of the right femoral neck. Electronically Signed   By: Dorise Bullion III M.D   On: 05/19/2018 20:08    ____________________________________________   PROCEDURES Procedures  ____________________________________________  DIFFERENTIAL DIAGNOSIS   Intracranial hemorrhage, right hip fracture  CLINICAL IMPRESSION / ASSESSMENT AND PLAN / ED COURSE  Medications ordered in the ED: Medications  morphine 4 MG/ML injection 4 mg (has no administration in time range)  ondansetron (ZOFRAN) injection 4 mg (has no administration in time range)  sodium chloride 0.9 % bolus 500 mL (has no administration in time range)    Pertinent labs & imaging results that were available during my care of the patient were reviewed by me and considered in my medical decision making (see chart for details).  CAEDENCE SNOWDEN was evaluated in Emergency Department on 05/19/2018 for the symptoms described in the history of present illness. She was evaluated in the context of the global COVID-19 pandemic, which necessitated consideration that the patient might be at risk for infection with the SARS-CoV-2 virus that causes COVID-19. Institutional protocols and algorithms that pertain to the evaluation of patients at risk for COVID-19 are in a state of rapid change based on information released by regulatory bodies including the CDC and federal and state organizations. These policies and algorithms were followed during the patient's care in the ED.   Patient presents with right hip pain after a fall.  Also concern for intracranial traumatic injury.  Vital signs unremarkable.  CT scan unremarkable.  X-ray of the right hip does show a displaced fracture of the right femoral neck.  Discussed with Dr. Mack Guise who recommends hospitalist admission for preop evaluation.  On reassessment patient is having  ongoing pain so give her morphine 4 mg IV for continued pain control.  Saline 500 mg bolus for hydration.      ____________________________________________   FINAL CLINICAL IMPRESSION(S) / ED DIAGNOSES    Final diagnoses:  Closed fracture of neck of right femur, initial encounter [facility])     ED Discharge Orders    None      Portions of this note were generated with dragon dictation software. Dictation errors may occur despite best attempts at proofreading.   Carrie Mew, MD 05/19/18 2119

## 2018-05-19 NOTE — ED Notes (Signed)
Pts friend Sam updated at this time, okay with pt to share information.

## 2018-05-19 NOTE — H&P (Signed)
Alexandria Roman is an 85 y.o. female.   Chief Complaint: Fall HPI: The patient with past medical history of hypertension and endometrial cancer presents to the emergency department following a mechanical fall.  The patient tripped and fell on her right side which time she immediately felt pain and could not stand.  She had no loss of consciousness.  X-ray of her hip revealed a closed femoral neck fracture which prompted the emergency department staff call hospitalist service for admission.  Past Medical History:  Diagnosis Date  . Cancer ([place])   . Hypertension     Past Surgical History:  Procedure Laterality Date  . ABDOMINAL HYSTERECTOMY      Family History  Problem Relation Age of Onset  . Testicular cancer Father    Social History:  reports that she has never smoked. She has never used smokeless tobacco. She reports that she does not drink alcohol or use drugs.  Allergies:  Allergies  Allergen Reactions  . Amoxicillin-Pot Clavulanate Diarrhea  . Cephalexin Other (See Comments) and Nausea And Vomiting  . Hydrochlorothiazide Other (See Comments) and Nausea And Vomiting  . Hydrocodone-Chlorpheniramine Other (See Comments)  . Hydrocodone-Homatropine Other (See Comments)  . Metronidazole Other (See Comments)    Other reaction(s): Other (See Comments) Other Reaction: Not Assessed Other Reaction: Not Assessed  . Moxifloxacin Other (See Comments)  . Prednisone Other (See Comments)  . Simvastatin Other (See Comments)  . Sulfa Antibiotics Other (See Comments)    Other reaction(s): Unknown  . Sulfasalazine Other (See Comments)    Other reaction(s): Unknown  . Tramadol Other (See Comments)  . Celecoxib Nausea And Vomiting  . Codeine Nausea Only    Other reaction(s): Other (See Comments) Other reaction(s): Unknown  . Cyclobenzaprine Other (See Comments) and Nausea Only    Other reaction(s): Other (See Comments)  . Oxycodone-Acetaminophen Nausea Only and Nausea And Vomiting  .  Penicillin V Potassium Rash    Other reaction(s): Other (See Comments) Other reaction(s): Unknown    Medications Prior to Admission  Medication Sig Dispense Refill  . Cholecalciferol (VITAMIN D3) 1000 units CAPS Take 1 capsule (1,000 Units total) by mouth daily. 60 capsule   . cyclobenzaprine (FLEXERIL) 10 MG tablet Take 10 mg by mouth 3 (three) times daily as needed for muscle spasms.    . DULoxetine (CYMBALTA) 30 MG capsule Take 1 capsule by mouth 2 (two) times daily.    Marland Kitchen gabapentin (NEURONTIN) 300 MG capsule Take 2 capsules (600 mg total) by mouth 2 (two) times daily.    Marland Kitchen HYDROcodone-acetaminophen (NORCO/VICODIN) 5-325 MG tablet Take 1 tablet by mouth every 6 (six) hours as needed for moderate pain.    Marland Kitchen levothyroxine (SYNTHROID, LEVOTHROID) 50 MCG tablet Take 50 mcg by mouth daily before breakfast.    . LORazepam (ATIVAN) 0.5 MG tablet Take 1 tablet (0.5 mg total) by mouth 2 (two) times daily. 10 tablet 0  . losartan (COZAAR) 25 MG tablet Take 1 tablet by mouth daily.    . meclizine (ANTIVERT) 25 MG tablet Take 1 tablet (25 mg total) by mouth 3 (three) times daily as needed for dizziness. 30 tablet 0  . methylPREDNISolone (MEDROL DOSEPAK) 4 MG TBPK tablet Take 4-24 mg by mouth daily. Taper daily as directed for 6 days    . Multiple Vitamins-Minerals (PRESERVISION/LUTEIN) CAPS Take 1 capsule by mouth 2 (two) times daily.    Marland Kitchen olopatadine (PATANOL) 0.1 % ophthalmic solution Place 1 drop into both eyes 2 (two) times daily.     Marland Kitchen  pravastatin (PRAVACHOL) 40 MG tablet Take 40 mg by mouth daily.      Results for orders placed or performed during the hospital encounter of 05/19/18 (from the past 48 hour(s))  CBC with Differential     Status: None   Collection Time: 05/19/18  7:31 PM  Result Value Ref Range   WBC 9.9 4.0 - 10.5 K/uL   RBC 4.84 3.87 - 5.11 MIL/uL   Hemoglobin 14.3 12.0 - 15.0 g/dL   HCT 42.3 36.0 - 46.0 %   MCV 87.4 80.0 - 100.0 fL   MCH 29.5 26.0 - 34.0 pg   MCHC 33.8  30.0 - 36.0 g/dL   RDW 14.4 11.5 - 15.5 %   Platelets 280 150 - 400 K/uL   nRBC 0.0 0.0 - 0.2 %   Neutrophils Relative % 68 %   Neutro Abs 6.7 1.7 - 7.7 K/uL   Lymphocytes Relative 21 %   Lymphs Abs 2.1 0.7 - 4.0 K/uL   Monocytes Relative 9 %   Monocytes Absolute 0.9 0.1 - 1.0 K/uL   Eosinophils Relative 1 %   Eosinophils Absolute 0.1 0.0 - 0.5 K/uL   Basophils Relative 1 %   Basophils Absolute 0.1 0.0 - 0.1 K/uL   Immature Granulocytes 0 %   Abs Immature Granulocytes 0.04 0.00 - 0.07 K/uL    Comment: Performed at [facility], [address] [place], [place] [address]  Comprehensive metabolic panel     Status: Abnormal   Collection Time: 05/19/18  7:31 PM  Result Value Ref Range   Sodium 137 135 - 145 mmol/L   Potassium 3.3 (L) 3.5 - 5.1 mmol/L   Chloride 103 98 - 111 mmol/L   CO2 22 22 - 32 mmol/L   Glucose, Bld 107 (H) 70 - 99 mg/dL   BUN 15 8 - 23 mg/dL   Creatinine, Ser 0.88 0.44 - 1.00 mg/dL   Calcium 8.9 8.9 - 10.3 mg/dL   Total Protein 6.9 6.5 - 8.1 g/dL   Albumin 4.1 3.5 - 5.0 g/dL   AST 19 15 - 41 U/L   ALT 12 0 - 44 U/L   Alkaline Phosphatase 54 38 - 126 U/L   Total Bilirubin 0.7 0.3 - 1.2 mg/dL   GFR calc non Af Amer 59 (L) >60 mL/min   GFR calc Af Amer >60 >60 mL/min   Anion gap 12 5 - 15    Comment: Performed at [facility], [address], [place], [place] [address]   Dg Chest 1 View  Result Date: 05/19/2018 CLINICAL DATA:  Fall earlier this evening. EXAM: CHEST  1 VIEW COMPARISON:  March 19, 2016 FINDINGS: A hiatal hernia is identified. The cardiomediastinal silhouette is stable. No pneumothorax. The lungs are clear. IMPRESSION: No active disease. Electronically Signed   By: Dorise Bullion III M.D   On: 05/19/2018 20:09   Dg Hip Unilat W Or Wo Pelvis 2-3 Views Right  Result Date: 05/19/2018 CLINICAL DATA:  Right hip pain after fall. EXAM: DG HIP (WITH OR WITHOUT PELVIS) 2-3V RIGHT COMPARISON:  None. FINDINGS: There is a fracture  through the base of the femoral neck on the right. The left hip is intact on a single frontal view. The remainder of the pelvic bones are intact. No dislocation noted. IMPRESSION: Fracture through the base of the right femoral neck. Electronically Signed   By: Dorise Bullion III M.D   On: 05/19/2018 20:08    Review of Systems  Constitutional:  Negative for chills and fever.  HENT: Negative for sore throat and tinnitus.   Eyes: Negative for blurred vision and redness.  Respiratory: Negative for cough and shortness of breath.   Cardiovascular: Negative for chest pain, palpitations, orthopnea and PND.  Gastrointestinal: Negative for abdominal pain, diarrhea, nausea and vomiting.  Genitourinary: Negative for dysuria, frequency and urgency.  Musculoskeletal: Positive for falls and joint pain. Negative for myalgias.  Skin: Negative for rash.       No lesions  Neurological: Negative for speech change, focal weakness and weakness.  Endo/Heme/Allergies: Does not bruise/bleed easily.       No temperature intolerance  Psychiatric/Behavioral: Negative for depression and suicidal ideas.    Blood pressure (!) 158/95, pulse 100, temperature 98.1 F (36.7 C), temperature source Oral, resp. rate 13, height 5\' 4"  (1.626 m), weight 77.1 kg, SpO2 99 %. Physical Exam  Vitals reviewed. Constitutional: She is oriented to person, place, and time. She appears well-developed and well-nourished. No distress.  HENT:  Head: Normocephalic and atraumatic.  Mouth/Throat: Oropharynx is clear and moist.  Eyes: Pupils are equal, round, and reactive to light. Conjunctivae and EOM are normal. No scleral icterus.  Neck: Normal range of motion. Neck supple. No JVD present. No tracheal deviation present. No thyromegaly present.  Cardiovascular: Normal rate, regular rhythm and normal heart sounds. Exam reveals no gallop and no friction rub.  No murmur heard. Respiratory: Effort normal and breath sounds normal.  GI: Soft.  Bowel sounds are normal. She exhibits no distension. There is no abdominal tenderness.  Genitourinary:    Genitourinary Comments: Deferred   Musculoskeletal:        General: No edema.     Comments: Decreased range of motion right hip due to pain  Lymphadenopathy:    She has no cervical adenopathy.  Neurological: She is alert and oriented to person, place, and time. No cranial nerve deficit. She exhibits normal muscle tone.  Skin: Skin is warm and dry. No rash noted. No erythema.  Psychiatric: She has a normal mood and affect. Her behavior is normal. Judgment and thought content normal.     Assessment/Plan This is an 85 year old female admitted for hip fracture. 1.  Hip fracture: Right femoral neck; manage severe pain with IV morphine.  The patient will be n.p.o. after midnight in anticipation of surgical repair.  Consult orthopedic surgery. 2.  Hypertension: Controlled; continue losartan.  Labetalol as needed prior to surgery. 3.  Hypothyroidism: Check TSH; continue Synthroid 4.  Hyperlipidemia: Continue statin therapy 5.  DVT prophylaxis: SCDs 6.  GI prophylaxis: None The patient is a full code.  Time spent on admission orders and patient care approximately 45 minutes  Harrie Foreman, MD 05/19/2018, 9:58 PM

## 2018-05-19 NOTE — ED Triage Notes (Signed)
Pt to ed via ems from home. Pt states she was walking slipped and fell landing on her right side hitting her face on carpet flooring., Pt denies blood thinner use. Pt c/o right leg pain. Pt did not lose consciousness. VSS. Pt was given 27mcg fentanyl with EMS

## 2018-05-20 ENCOUNTER — Inpatient Hospital Stay: Payer: Medicare Other | Admitting: Registered Nurse

## 2018-05-20 ENCOUNTER — Inpatient Hospital Stay: Payer: Medicare Other

## 2018-05-20 ENCOUNTER — Encounter
Admission: EM | Disposition: A | Discharge status: Skilled Nursing Facility | Payer: Self-pay | Source: Home / Self Care | Attending: Internal Medicine

## 2018-05-20 HISTORY — PX: HIP ARTHROPLASTY: SHX981

## 2018-05-20 LAB — MAGNESIUM: Magnesium: 2 mg/dL (ref 1.7–2.4)

## 2018-05-20 LAB — APTT: aPTT: 28 seconds (ref 24–36)

## 2018-05-20 LAB — PROTIME-INR
INR: 1 (ref 0.8–1.2)
Prothrombin Time: 13.3 seconds (ref 11.4–15.2)

## 2018-05-20 LAB — SURGICAL PCR SCREEN
MRSA, PCR: NEGATIVE
Staphylococcus aureus: NEGATIVE

## 2018-05-20 SURGERY — HEMIARTHROPLASTY, HIP, DIRECT ANTERIOR APPROACH, FOR FRACTURE
Anesthesia: Spinal | Laterality: Right

## 2018-05-20 MED ORDER — MAGNESIUM CITRATE PO SOLN
1.0000 | Freq: Once | ORAL | Status: DC | PRN
  Filled 2018-05-20: qty 296

## 2018-05-20 MED ORDER — KETOROLAC TROMETHAMINE 15 MG/ML IJ SOLN
7.5000 mg | Freq: Four times a day (QID) | INTRAMUSCULAR | Status: DC
  Administered 2018-05-20: 7.5 mg via INTRAVENOUS
  Filled 2018-05-20 (×4): qty 1

## 2018-05-20 MED ORDER — SODIUM CHLORIDE 0.9 % IV SOLN
INTRAVENOUS | Status: DC | PRN
  Administered 2018-05-20: 30 ug/min via INTRAVENOUS

## 2018-05-20 MED ORDER — BISACODYL 10 MG RE SUPP
10.0000 mg | Freq: Every day | RECTAL | Status: DC | PRN
  Administered 2018-05-23: 10 mg via RECTAL
  Filled 2018-05-20: qty 1

## 2018-05-20 MED ORDER — ACETAMINOPHEN 500 MG PO TABS
1000.0000 mg | ORAL_TABLET | Freq: Four times a day (QID) | ORAL | Status: DC
  Administered 2018-05-20 – 2018-05-21 (×3): 1000 mg via ORAL
  Filled 2018-05-20 (×3): qty 2

## 2018-05-20 MED ORDER — POLYETHYLENE GLYCOL 3350 17 G PO PACK: 17.0000 g | PACK | Freq: Every day | ORAL | Status: DC | PRN

## 2018-05-20 MED ORDER — PHENOL 1.4 % MT LIQD
1.0000 | OROMUCOSAL | Status: DC | PRN
  Filled 2018-05-20: qty 177

## 2018-05-20 MED ORDER — PROPOFOL 500 MG/50ML IV EMUL
INTRAVENOUS | Status: DC | PRN
  Administered 2018-05-20: 70 ug/kg/min via INTRAVENOUS

## 2018-05-20 MED ORDER — GABAPENTIN 300 MG PO CAPS
600.0000 mg | ORAL_CAPSULE | Freq: Two times a day (BID) | ORAL | Status: DC
  Administered 2018-05-20 – 2018-05-23 (×6): 600 mg via ORAL
  Filled 2018-05-20 (×6): qty 2

## 2018-05-20 MED ORDER — ACETAMINOPHEN 325 MG PO TABS
325.0000 mg | ORAL_TABLET | Freq: Four times a day (QID) | ORAL | Status: DC | PRN
  Administered 2018-05-23: 650 mg via ORAL
  Filled 2018-05-20: qty 2

## 2018-05-20 MED ORDER — ONDANSETRON HCL 4 MG/2ML IJ SOLN
INTRAMUSCULAR | Status: AC
  Filled 2018-05-20: qty 2

## 2018-05-20 MED ORDER — FENTANYL CITRATE (PF) 100 MCG/2ML IJ SOLN: 25.0000 ug | INTRAMUSCULAR | Status: DC | PRN

## 2018-05-20 MED ORDER — LEVOTHYROXINE SODIUM 100 MCG PO TABS
100.0000 ug | ORAL_TABLET | Freq: Every day | ORAL | Status: DC
  Administered 2018-05-21 – 2018-05-23 (×3): 100 ug via ORAL
  Filled 2018-05-20 (×3): qty 1

## 2018-05-20 MED ORDER — ONDANSETRON HCL 4 MG/2ML IJ SOLN: 4.0000 mg | Freq: Once | INTRAMUSCULAR | Status: DC | PRN

## 2018-05-20 MED ORDER — ENOXAPARIN SODIUM 40 MG/0.4ML ~~LOC~~ SOLN
40.0000 mg | SUBCUTANEOUS | Status: DC
  Administered 2018-05-21 – 2018-05-23 (×3): 40 mg via SUBCUTANEOUS
  Filled 2018-05-20 (×3): qty 0.4

## 2018-05-20 MED ORDER — PROPOFOL 500 MG/50ML IV EMUL
INTRAVENOUS | Status: AC
  Filled 2018-05-20: qty 50

## 2018-05-20 MED ORDER — DOCUSATE SODIUM 100 MG PO CAPS
100.0000 mg | ORAL_CAPSULE | Freq: Two times a day (BID) | ORAL | Status: DC
  Administered 2018-05-20 – 2018-05-23 (×6): 100 mg via ORAL
  Filled 2018-05-20 (×6): qty 1

## 2018-05-20 MED ORDER — MENTHOL 3 MG MT LOZG
1.0000 | LOZENGE | OROMUCOSAL | Status: DC | PRN
  Filled 2018-05-20: qty 9

## 2018-05-20 MED ORDER — ALUM & MAG HYDROXIDE-SIMETH 200-200-20 MG/5ML PO SUSP: 30.0000 mL | ORAL | Status: DC | PRN

## 2018-05-20 MED ORDER — ONDANSETRON HCL 4 MG PO TABS: 4.0000 mg | ORAL_TABLET | Freq: Four times a day (QID) | ORAL | Status: DC | PRN

## 2018-05-20 MED ORDER — METOCLOPRAMIDE HCL 10 MG PO TABS
5.0000 mg | ORAL_TABLET | Freq: Three times a day (TID) | ORAL | Status: DC | PRN
  Filled 2018-05-20: qty 0.5

## 2018-05-20 MED ORDER — DULOXETINE HCL 30 MG PO CPEP
30.0000 mg | ORAL_CAPSULE | Freq: Two times a day (BID) | ORAL | Status: DC
  Administered 2018-05-20 – 2018-05-23 (×6): 30 mg via ORAL
  Filled 2018-05-20 (×6): qty 1

## 2018-05-20 MED ORDER — PROPOFOL 10 MG/ML IV BOLUS
INTRAVENOUS | Status: AC
  Filled 2018-05-20: qty 20

## 2018-05-20 MED ORDER — METHOCARBAMOL 500 MG PO TABS
500.0000 mg | ORAL_TABLET | Freq: Four times a day (QID) | ORAL | Status: DC | PRN
  Filled 2018-05-20: qty 1

## 2018-05-20 MED ORDER — HYDROMORPHONE HCL 1 MG/ML IJ SOLN: 0.5000 mg | INTRAMUSCULAR | Status: DC | PRN

## 2018-05-20 MED ORDER — OXYCODONE HCL 5 MG PO TABS: 5.0000 mg | ORAL_TABLET | ORAL | Status: DC | PRN

## 2018-05-20 MED ORDER — BUPIVACAINE HCL (PF) 0.5 % IJ SOLN
INTRAMUSCULAR | Status: DC | PRN
  Administered 2018-05-20: 3 mL

## 2018-05-20 MED ORDER — METHOCARBAMOL 1000 MG/10ML IJ SOLN
500.0000 mg | Freq: Four times a day (QID) | INTRAVENOUS | Status: DC | PRN
  Filled 2018-05-20: qty 5

## 2018-05-20 MED ORDER — METOCLOPRAMIDE HCL 5 MG/ML IJ SOLN: 5.0000 mg | Freq: Three times a day (TID) | INTRAMUSCULAR | Status: DC | PRN

## 2018-05-20 MED ORDER — VANCOMYCIN HCL 1000 MG IV SOLR
1000.0000 mg | Freq: Once | INTRAVENOUS | Status: DC
  Filled 2018-05-20: qty 1000

## 2018-05-20 MED ORDER — FLUTICASONE PROPIONATE 50 MCG/ACT NA SUSP
1.0000 | NASAL | Status: DC | PRN
  Filled 2018-05-20: qty 16

## 2018-05-20 MED ORDER — OCUVITE-LUTEIN PO CAPS
1.0000 | ORAL_CAPSULE | Freq: Two times a day (BID) | ORAL | Status: DC
  Administered 2018-05-20 – 2018-05-23 (×5): 1 via ORAL
  Filled 2018-05-20 (×7): qty 1

## 2018-05-20 MED ORDER — ONDANSETRON HCL 4 MG/2ML IJ SOLN: 4.0000 mg | Freq: Four times a day (QID) | INTRAMUSCULAR | Status: DC | PRN

## 2018-05-20 MED ORDER — VANCOMYCIN HCL IN DEXTROSE 1-5 GM/200ML-% IV SOLN
1000.0000 mg | Freq: Two times a day (BID) | INTRAVENOUS | Status: AC
  Administered 2018-05-20: 21:00:00 1000 mg via INTRAVENOUS
  Filled 2018-05-20: qty 200

## 2018-05-20 MED ORDER — MECLIZINE HCL 25 MG PO TABS
25.0000 mg | ORAL_TABLET | Freq: Three times a day (TID) | ORAL | Status: DC | PRN
  Administered 2018-05-21: 25 mg via ORAL
  Filled 2018-05-20 (×3): qty 1

## 2018-05-20 MED ORDER — VANCOMYCIN HCL IN DEXTROSE 1-5 GM/200ML-% IV SOLN
1000.0000 mg | Freq: Once | INTRAVENOUS | Status: AC
  Administered 2018-05-20: 1000 mg via INTRAVENOUS
  Filled 2018-05-20: qty 200

## 2018-05-20 MED ORDER — MIDAZOLAM HCL 2 MG/2ML IJ SOLN
INTRAMUSCULAR | Status: AC
  Filled 2018-05-20: qty 2

## 2018-05-20 MED ORDER — OXYCODONE HCL 5 MG PO TABS
10.0000 mg | ORAL_TABLET | ORAL | Status: DC | PRN
  Administered 2018-05-20: 19:00:00 10 mg via ORAL
  Filled 2018-05-20: qty 2

## 2018-05-20 MED ORDER — ONDANSETRON HCL 4 MG/2ML IJ SOLN
INTRAMUSCULAR | Status: DC | PRN
  Administered 2018-05-20: 4 mg via INTRAVENOUS

## 2018-05-20 MED ORDER — MIDAZOLAM HCL 5 MG/5ML IJ SOLN
INTRAMUSCULAR | Status: DC | PRN
  Administered 2018-05-20: 1 mg via INTRAVENOUS

## 2018-05-20 MED ORDER — LACTATED RINGERS IV SOLN
INTRAVENOUS | Status: DC | PRN
  Administered 2018-05-20: 11:00:00 via INTRAVENOUS

## 2018-05-20 MED ORDER — POTASSIUM CHLORIDE IN NACL 40-0.9 MEQ/L-% IV SOLN
INTRAVENOUS | Status: DC
  Administered 2018-05-20 – 2018-05-21 (×3): 75 mL/h via INTRAVENOUS
  Filled 2018-05-20 (×4): qty 1000

## 2018-05-20 MED ORDER — PROPOFOL 10 MG/ML IV BOLUS
INTRAVENOUS | Status: DC | PRN
  Administered 2018-05-20: 20 mg via INTRAVENOUS

## 2018-05-20 MED ORDER — TRAMADOL HCL 50 MG PO TABS
50.0000 mg | ORAL_TABLET | Freq: Four times a day (QID) | ORAL | Status: DC
  Administered 2018-05-20 – 2018-05-23 (×8): 50 mg via ORAL
  Filled 2018-05-20 (×9): qty 1

## 2018-05-20 SURGICAL SUPPLY — 56 items
BLADE SAGITTAL WIDE XTHICK NO (BLADE) ×3 IMPLANT
BLADE SURG SZ10 CARB STEEL (BLADE) ×3 IMPLANT
BNDG COHESIVE 4X5 TAN STRL (GAUZE/BANDAGES/DRESSINGS) ×3 IMPLANT
CANISTER SUCT 1200ML W/VALVE (MISCELLANEOUS) ×3 IMPLANT
CANISTER SUCT 3000ML PPV (MISCELLANEOUS) ×6 IMPLANT
COVER WAND RF STERILE (DRAPES) ×3 IMPLANT
DRAPE IMP U-DRAPE 54X76 (DRAPES) ×3 IMPLANT
DRAPE INCISE IOBAN 66X60 STRL (DRAPES) ×3 IMPLANT
DRAPE SHEET LG 3/4 BI-LAMINATE (DRAPES) ×6 IMPLANT
DRAPE SURG 17X11 SM STRL (DRAPES) ×3 IMPLANT
DRAPE TABLE BACK 80X90 (DRAPES) ×3 IMPLANT
DRSG OPSITE POSTOP 4X10 (GAUZE/BANDAGES/DRESSINGS) IMPLANT
DRSG OPSITE POSTOP 4X12 (GAUZE/BANDAGES/DRESSINGS) ×3 IMPLANT
DURAPREP 26ML APPLICATOR (WOUND CARE) ×9 IMPLANT
ELECT BLADE 6.5 EXT (BLADE) ×3 IMPLANT
ELECT CAUTERY BLADE 6.4 (BLADE) ×3 IMPLANT
ELECT REM PT RETURN 9FT ADLT (ELECTROSURGICAL) ×3
ELECTRODE REM PT RTRN 9FT ADLT (ELECTROSURGICAL) ×1 IMPLANT
GAUZE PETRO XEROFOAM 1X8 (MISCELLANEOUS) IMPLANT
GAUZE SPONGE 4X4 12PLY STRL (GAUZE/BANDAGES/DRESSINGS) ×3 IMPLANT
GLOVE BIO SURGEON STRL SZ7.5 (GLOVE) ×15 IMPLANT
GLOVE BIOGEL PI IND STRL 9 (GLOVE) ×1 IMPLANT
GLOVE BIOGEL PI INDICATOR 9 (GLOVE) ×2
GLOVE SURG 9.0 ORTHO LTXF (GLOVE) ×6 IMPLANT
GOWN STRL REUS TWL 2XL XL LVL4 (GOWN DISPOSABLE) ×3 IMPLANT
GOWN STRL REUS W/ TWL LRG LVL3 (GOWN DISPOSABLE) ×1 IMPLANT
GOWN STRL REUS W/TWL LRG LVL3 (GOWN DISPOSABLE) ×2
HEAD MODULAR ENDO (Orthopedic Implant) ×2 IMPLANT
HEAD UNPLR 47XMDLR STRL HIP (Orthopedic Implant) ×1 IMPLANT
HEMOVAC 400ML (MISCELLANEOUS)
KIT DRAIN HEMOVAC JP 7FR 400ML (MISCELLANEOUS) IMPLANT
KIT TURNOVER KIT A (KITS) ×3 IMPLANT
NDL SAFETY ECLIPSE 18X1.5 (NEEDLE) ×1 IMPLANT
NEEDLE FILTER BLUNT 18X 1/2SAF (NEEDLE) ×2
NEEDLE FILTER BLUNT 18X1 1/2 (NEEDLE) ×1 IMPLANT
NEEDLE HYPO 18GX1.5 SHARP (NEEDLE) ×2
NEEDLE MAYO CATGUT SZ4 (NEEDLE) ×3 IMPLANT
NS IRRIG 1000ML POUR BTL (IV SOLUTION) ×3 IMPLANT
PACK HIP PROSTHESIS (MISCELLANEOUS) ×3 IMPLANT
PULSAVAC PLUS IRRIG FAN TIP (DISPOSABLE) ×3
RETRIEVER SUT HEWSON (MISCELLANEOUS) IMPLANT
SLEEVE UNITRAX V40 (Orthopedic Implant) ×2 IMPLANT
SLEEVE UNITRAX V40 +4 (Orthopedic Implant) ×1 IMPLANT
SOL .9 NS 3000ML IRR  AL (IV SOLUTION) ×2
SOL .9 NS 3000ML IRR UROMATIC (IV SOLUTION) ×1 IMPLANT
STAPLER SKIN PROX 35W (STAPLE) ×3 IMPLANT
STEM ACCOLADE SZ 6 (Hips) ×3 IMPLANT
SUT TICRON 2-0 30IN 311381 (SUTURE) ×12 IMPLANT
SUT VIC AB 0 CT1 36 (SUTURE) ×3 IMPLANT
SUT VIC AB 2-0 CT2 27 (SUTURE) ×6 IMPLANT
SYR 10ML LL (SYRINGE) ×3 IMPLANT
TAPE MICROFOAM 4IN (TAPE) ×3 IMPLANT
TAPE TRANSPORE STRL 2 31045 (GAUZE/BANDAGES/DRESSINGS) IMPLANT
TIP BRUSH PULSAVAC PLUS 24.33 (MISCELLANEOUS) ×3 IMPLANT
TIP FAN IRRIG PULSAVAC PLUS (DISPOSABLE) ×1 IMPLANT
TUBE SUCT KAM VAC (TUBING) ×3 IMPLANT

## 2018-05-20 NOTE — Consult Note (Signed)
ORTHOPAEDIC CONSULTATION  REQUESTING PHYSICIAN: Demetrios Loll, MD  Chief Complaint: Right hip pain s/p fall.  HPI: Alexandria Roman is a 85 y.o. female who complains of right hip pain status post fall at home.  Patient had mechanical fall.  No symptoms of syncope or dizziness.  Patient denies other injuries.  X-rays in the ER demonstrated a right femoral neck hip fracture.  CT scan is further confirm this.  Past Medical History:  Diagnosis Date  . Cancer ([place])   . Hypertension    Past Surgical History:  Procedure Laterality Date  . ABDOMINAL HYSTERECTOMY     Social History   Socioeconomic History  . Marital status: Married    Spouse name: Not on file  . Number of children: Not on file  . Years of education: Not on file  . Highest education level: Not on file  Occupational History  . Occupation: retired  Scientific laboratory technician  . Financial resource strain: Not on file  . Food insecurity:    Worry: Not on file    Inability: Not on file  . Transportation needs:    Medical: Not on file    Non-medical: Not on file  Tobacco Use  . Smoking status: Never Smoker  . Smokeless tobacco: Never Used  Substance and Sexual Activity  . Alcohol use: No  . Drug use: No  . Sexual activity: Not on file  Lifestyle  . Physical activity:    Days per week: Not on file    Minutes per session: Not on file  . Stress: Not on file  Relationships  . Social connections:    Talks on phone: Not on file    Gets together: Not on file    Attends religious service: Not on file    Active member of club or organization: Not on file    Attends meetings of clubs or organizations: Not on file    Relationship status: Not on file  Other Topics Concern  . Not on file  Social History Narrative  . Not on file   Family History  Problem Relation Age of Onset  . Testicular cancer Father    Allergies  Allergen Reactions  . Amoxicillin-Pot Clavulanate Diarrhea  . Cephalexin Other (See Comments) and Nausea And  Vomiting  . Hydrochlorothiazide Other (See Comments) and Nausea And Vomiting  . Hydrocodone-Chlorpheniramine Other (See Comments)  . Hydrocodone-Homatropine Other (See Comments)  . Metronidazole Other (See Comments)    Other reaction(s): Other (See Comments) Other Reaction: Not Assessed Other Reaction: Not Assessed  . Moxifloxacin Other (See Comments)  . Prednisone Nausea Only  . Simvastatin Other (See Comments)  . Sulfa Antibiotics Other (See Comments)    Other reaction(s): Unknown  . Sulfasalazine Other (See Comments)    Other reaction(s): Unknown  . Tramadol Other (See Comments)  . Celecoxib Nausea And Vomiting  . Codeine Nausea Only    Other reaction(s): Other (See Comments) Other reaction(s): Unknown  . Cyclobenzaprine Other (See Comments) and Nausea Only    Other reaction(s): Other (See Comments)  . Oxycodone-Acetaminophen Nausea Only and Nausea And Vomiting  . Penicillin V Potassium Rash    Other reaction(s): Other (See Comments) Other reaction(s): Unknown   Prior to Admission medications   Medication Sig Start Date End Date Taking? Authorizing Provider  docusate sodium (COLACE) 100 MG capsule Take 100 mg by mouth as needed for constipation.   Yes [provider]  DULoxetine (CYMBALTA) 30 MG capsule Take 1 capsule by mouth 2 (two) times  daily. 11/03/16  Yes [provider]  fluticasone (FLONASE) 50 MCG/ACT nasal spray Place 1 spray into both nostrils as needed. 11/02/16  Yes [provider]  gabapentin (NEURONTIN) 300 MG capsule Take 2 capsules (600 mg total) by mouth 2 (two) times daily. 12/09/16  Yes Plonk, Gwyndolyn Saxon, MD  levothyroxine (SYNTHROID, LEVOTHROID) 50 MCG tablet Take 50 mcg by mouth daily before breakfast.   Yes [provider]  LORazepam (ATIVAN) 0.5 MG tablet Take 1 tablet (0.5 mg total) by mouth 2 (two) times daily. 03/20/16  Yes Hillary Bow, MD  meclizine (ANTIVERT) 25 MG tablet Take 1 tablet (25 mg total) by mouth 3 (three)  times daily as needed for dizziness. 04/27/18  Yes Arta Silence, MD  Multiple Vitamins-Minerals (PRESERVISION/LUTEIN) CAPS Take 1 capsule by mouth 2 (two) times daily.   Yes [provider]  olopatadine (PATANOL) 0.1 % ophthalmic solution Place 1 drop into both eyes 2 (two) times daily.  11/03/16  Yes [provider]  ondansetron (ZOFRAN) 4 MG tablet Take 4 mg by mouth as needed for nausea.   Yes [provider]   Dg Chest 1 View  Result Date: 05/19/2018 CLINICAL DATA:  Fall earlier this evening. EXAM: CHEST  1 VIEW COMPARISON:  March 19, 2016 FINDINGS: A hiatal hernia is identified. The cardiomediastinal silhouette is stable. No pneumothorax. The lungs are clear. IMPRESSION: No active disease. Electronically Signed   By: Dorise Bullion III M.D   On: 05/19/2018 20:09   Dg Hip Unilat W Or Wo Pelvis 2-3 Views Right  Result Date: 05/19/2018 CLINICAL DATA:  Right hip pain after fall. EXAM: DG HIP (WITH OR WITHOUT PELVIS) 2-3V RIGHT COMPARISON:  None. FINDINGS: There is a fracture through the base of the femoral neck on the right. The left hip is intact on a single frontal view. The remainder of the pelvic bones are intact. No dislocation noted. IMPRESSION: Fracture through the base of the right femoral neck. Electronically Signed   By: Dorise Bullion III M.D   On: 05/19/2018 20:08    Positive ROS: All other systems have been reviewed and were otherwise negative with the exception of those mentioned in the HPI and as above.  Physical Exam: General: Alert, no acute distress Skin: No lesions in the area of chief complaint Neurologic: Sensation intact distally Psychiatric: Patient is competent for consent with normal mood and affect  MUSCULOSKELETAL: Right lower extremity: Skin is intact.  There is no erythema ecchymosis or swelling.  Compartments are soft and compressible.  Patient distally is neurovascular intact.  There is mild external rotation shortening of the  right lower extremity.  Assessment: Right femoral neck hip fracture  Plan: Patient has a displaced femoral neck hip fracture.  I have discussed this x-ray with the patient.  I have recommended a right hip hemiarthroplasty.  I described the details of the operation as well as the postoperative course to the patient.  We also discussed the risks and benefits of surgery.  She understands the risks include but are not limited to infection requiring the removal of the prosthesis, bleeding requiring blood transfusion, nerve or blood vessel injury, change in lower extremity rotation, leg length discrepancy, persistent hip pain, hip dislocation, fracture, hardware failure and the need for further surgery including conversion to a total hip arthroplasty.  He should also understands the medical risks include but are not limited to DVT and pulmonary embolism, myocardial infarction, stroke, pneumonia, respiratory failure and death.  Patient understood these risks and  agreed to proceed with surgery.    Thornton Park, MD    05/20/2018 10:27 AM

## 2018-05-20 NOTE — Anesthesia Post-op Follow-up Note (Signed)
Anesthesia QCDR form completed.        

## 2018-05-20 NOTE — Op Note (Signed)
05/20/2018  1:22 PM  PATIENT:  Alexandria Roman   MRN: [id number]  PRE-OPERATIVE DIAGNOSIS:  displaced right femoral neck hip fracture  POST-OPERATIVE DIAGNOSIS:  same  PROCEDURE:  Procedure(s): RIGHT HIP HEMIARTHROPLASTY   PREOPERATIVE INDICATIONS:    Alexandria Roman is an 85 y.o. female who was admitted with a diagnosis of displaced right femoral neck.  I have recommended surgical treatment with hemiarthroplasty for this injury. I have explained the surgery and the postoperative course to the patient who has agreed with surgical management of this fracture.    The risks benefits and alternatives were discussed with the patient and their family including but not limited to the risks of  infection requiring removal of the prosthesis, bleeding requiring blood transfusion, nerve injury especially to the sciatic nerve leading to foot drop or lower extremity numbness, periprosthetic fracture, dislocation leg length discrepancy, change in lower extremity rotation persistent hip pain, loosening or failure of the components and the need for revision surgery. Medical risks include but are not limited to DVT and pulmonary embolism, myocardial infarction, stroke, pneumonia, respiratory failure and death.  OPERATIVE REPORT     SURGEON:  Thornton Park, MD    ANESTHESIA:  spinal    COMPLICATIONS:  None.   SPECIMEN: Femoral head to pathology    COMPONENTS:  Stryker Accolade II femoral component size 6 with a 47 mm +4 neck adjustment sleeve.    PROCEDURE IN DETAIL:   The patient was seen and examined in her hospital room this AM.  The right hip was identified and marked at the operative site after verbally confirming with the patient that this was the correct site of surgery.  The patient was then transported to the OR  and  underwent spinal anesthesia.  The patient was then placed in the lateral decubitus position with the operative side up and secured on the operating room table with a pegboard  and all bony prominences were adequately padded. This included an axillary roll and additional padding around the nonoperative leg to prevent compression to the common peroneal nerve.    The operative lower extremity was prepped and draped in a sterile fashion.  A time out was performed prior to incision to verify patient's name, date of birth, medical record number, correct site of surgery correct procedure to be performed. The timeout was also used to verify the patient received antibiotics now appropriate instruments, implants and radiographic studies were available in the room. Once all in attendance were in agreement case began.    A posterolateral approach was utilized via sharp dissection  carried down to the subcutaneous tissue.  Bleeding vessels were coagulated using electrocautery.  The fascia lata was identified and incised along the length of the skin incision.  The gluteus maximus muscle was then split in line with its fibers. Self-retaining retractors were  inserted.  With the hip internally rotated, the short external rotators  were identified and removed from the posterior attachment from the greater trochanter. The piriformis was tagged for later repair. The capsule was identified and a T-shaped capsulotomy was performed. The capsule was tagged with #2 Tycron for later repair.  The fracture, as evaluated by CT scan involved the medial calcar above the lesser trochanter.  An oscillating saw was used to perform a proximal femoral osteotomy below the fractured calcar and above the lesser trochanter which was not fractured.  After the proximal femoral osteotomy, the femoral head was removed using a corkscrew device. This was measured to be  47 mm in diameter.  The trial 47 mm femoral head was placed into the acetabulum and had an excellent suction fit.   The attention was then turned back to femoral preparation.  A femoral skid and Cobra retractor were placed under the femoral neck to allow for  adequate visualization. A box osteotome was used to make the initial entry into the proximal femur. A single hand reamer was used to prepare the femoral canal. A T-shaped femoral canal sounder was then used to ensure no penetration femoral cortex had occurred during reaming. The proximal femur was then sequentially broached by hand. A size 6 femoral trial broach was found to have best medial to lateral canal fit. Once adequate mediolateral canal fill was achieved the trial femoral broach, neck, and head was assembled and the hip was reduced.  Trial neck length of +0, +4 and +8 were trialed.  It was found that the +4 head achieve the best stability, equivalent leg lengths with functional range of motion. The trial components were then removed.  I copiously irrigated the femoral canal and then impacted the shoulder size 6 Accolade 2 femoral prosthesis into place into the appropriate version, slightly anteverted to the normal anatomy, and I impacted the actual 47 mm Unitrax femoral component with a +4 neck adjustment sleeve into place. The hip was then reduced and taken through functional range of motion and found to have excellent stability. Leg lengths were restored. The hip joint was copiously irrigated.   A soft tissue repair of the capsule and external rotators was performed using #2 Tycron Excellent posterior capsular repair was achieved. The fascia lata was then closed with interrupted 0 Vicryl suture. The subcutaneous tissues were closed with 2-0 Vicryl and the skin approximated with staples.   The patient was then placed supine on the operative table. Leg lengths were checked clinically and found to be equivalent. An abduction pillow was placed between the lower extremities. The patient was then transferred to a hospital bed and brought to the PACU in stable condition. I was scrubbed and present the entire case and all sharp, sponge and instrument counts were correct at the conclusion of the case.     Timoteo Gaul, MD Orthopedic Surgeon

## 2018-05-20 NOTE — Progress Notes (Signed)
Advanced Care Plan.  Purpose of Encounter: Code status. Parties in Attendance: The patient and me. Patient's Decisional Capacity: Yes. Medical Story: Alexandria Roman is an 85 y.o. female with history of HTN and cancer. She is admitted for right hip fracture. I discussed with her about her current condition, prognosis and code status. She wants to be resuscitated and intubated if she has cardiopulmonary arrest. Plan:  Code Status: FULL CODE. Time spent discussing advance care planning: 17 minutes.

## 2018-05-20 NOTE — NC FL2 (Signed)
[place] LEVEL OF CARE SCREENING TOOL     IDENTIFICATION  Patient Name: Alexandria Roman Birthdate: 08/05/1929 Sex: female Admission Date (Current Location): 05/19/2018  [place] and [place] Number:  Engineering geologist and Address:  [facility], [address], [place], [place] [address]      Provider Number: [id number]  Attending Physician Name and Address:  Demetrios Loll, MD  Relative Name and Phone Number:  Darryll Capers [facility]    Current Level of Care: Hospital Recommended Level of Care: [place] Prior Approval Number:    Date Approved/Denied:   PASRR Number: [id number]  Discharge Plan: SNF    Current Diagnoses: Patient Active Problem List   Diagnosis Date Noted  . Hip fracture ([place]) 05/19/2018  . Depression with anxiety 12/09/2016  . Numbness of foot 12/09/2016  . Macular degeneration 12/09/2016  . Gait instability 12/08/2016  . Upper respiratory infection, viral 12/06/2016  . Primary osteoarthritis of both knees 07/20/2016  . Postmenopausal osteoporosis 04/15/2016  . Chest pain 03/19/2016  . Spinal stenosis 08/04/2015  . Myalgia 06/11/2015  . Polyarthralgia 06/11/2015  . Chronic midline low back pain with bilateral sciatica 01/10/2015  . Benign essential hypertension 06/04/2014  . CKD (chronic kidney disease) stage 3, GFR 30-59 ml/min (HCC) 06/04/2014  . Mixed hyperlipidemia 06/04/2014  . Other allergic rhinitis 06/04/2014  . DDD (degenerative disc disease), lumbar 09/07/2013  . Lumbar radiculitis 09/07/2013  . Hypothyroidism, unspecified 08/02/2013    Orientation RESPIRATION BLADDER Height & Weight     Self, Time, Situation, Place  Normal Continent Weight: 168 lb (76.2 kg) Height:  5\' 4"  (162.6 cm)  BEHAVIORAL SYMPTOMS/MOOD NEUROLOGICAL BOWEL NUTRITION STATUS      Continent    AMBULATORY STATUS COMMUNICATION OF NEEDS Skin   Extensive Assist Verbally Surgical wounds                        Personal Care Assistance Level of Assistance  Bathing, Feeding, Dressing Bathing Assistance: Limited assistance Feeding assistance: Independent Dressing Assistance: Limited assistance     Functional Limitations Info  Sight, Hearing, Speech Sight Info: Adequate Hearing Info: Adequate Speech Info: Adequate    SPECIAL CARE FACTORS FREQUENCY  PT (By licensed PT)     PT Frequency: 5X              Contractures Contractures Info: Not present    Additional Factors Info  Code Status, Allergies Code Status Info: Full Allergies Info: Amoxicillin-pot Clavulanate, Cephalexin, Hydrochlorothiazide, Hydrocodone-chlorpheniramine, Hydrocodone-homatropine, Metronidazole, Moxifloxacin, Prednisone, Simvastatin, Sulfa Antibiotics, Sulfasalazine, Tramadol, Celecoxib, Codeine, Cyclobenzaprine, Oxycodone-acetaminophen, Penicillin V Potassium           Current Medications (05/20/2018):  This is the current hospital active medication list Current Facility-Administered Medications  Medication Dose Route Frequency Provider Last Rate Last Dose  . 0.9 % NaCl with KCl 40 mEq / L  infusion   Intravenous Continuous Thornton Park, MD 75 mL/hr at 05/20/18 1424 75 mL/hr at 05/20/18 1424  . acetaminophen (TYLENOL) tablet 1,000 mg  1,000 mg Oral Q6H Thornton Park, MD      . Derrill Memo ON 05/21/2018] acetaminophen (TYLENOL) tablet 325-650 mg  325-650 mg Oral Q6H PRN Thornton Park, MD      . alum & mag hydroxide-simeth (MAALOX/MYLANTA) 200-200-20 MG/5ML suspension 30 mL  30 mL Oral Q4H PRN Thornton Park, MD      . bisacodyl (DULCOLAX) suppository 10 mg  10 mg Rectal Daily PRN Thornton Park, MD      .  cholecalciferol (VITAMIN D3) tablet 1,000 Units  1,000 Units Oral Daily Thornton Park, MD      . docusate sodium (COLACE) capsule 100 mg  100 mg Oral BID Thornton Park, MD      . DULoxetine (CYMBALTA) DR capsule 30 mg  30 mg Oral BID Thornton Park, MD      . Derrill Memo ON 05/21/2018] enoxaparin  (LOVENOX) injection 40 mg  40 mg Subcutaneous Q24H Thornton Park, MD      . fluticasone [facility]) 50 MCG/ACT nasal spray 1 spray  1 spray Each Nare PRN Thornton Park, MD      . gabapentin (NEURONTIN) capsule 600 mg  600 mg Oral BID Thornton Park, MD      . HYDROmorphone (DILAUDID) injection 0.5 mg  0.5 mg Intravenous Q4H PRN Thornton Park, MD      . ketorolac (TORADOL) 15 MG/ML injection 7.5 mg  7.5 mg Intravenous Q6H Thornton Park, MD      . Derrill Memo ON 05/21/2018] levothyroxine (SYNTHROID, LEVOTHROID) tablet 100 mcg  100 mcg Oral QAC breakfast Thornton Park, MD      . LORazepam (ATIVAN) tablet 0.5 mg  0.5 mg Oral BID Thornton Park, MD   0.5 mg at 05/19/18 2332  . losartan (COZAAR) tablet 25 mg  25 mg Oral Daily Thornton Park, MD      . magnesium citrate solution 1 Bottle  1 Bottle Oral Once PRN Thornton Park, MD      . meclizine (ANTIVERT) tablet 25 mg  25 mg Oral TID PRN Thornton Park, MD      . menthol-cetylpyridinium (CEPACOL) lozenge 3 mg  1 lozenge Oral PRN Thornton Park, MD       Or  . phenol (CHLORASEPTIC) mouth spray 1 spray  1 spray Mouth/Throat PRN Thornton Park, MD      . methocarbamol (ROBAXIN) tablet 500 mg  500 mg Oral Q6H PRN Thornton Park, MD       Or  . methocarbamol (ROBAXIN) 500 mg in dextrose 5 % 50 mL IVPB  500 mg Intravenous Q6H PRN Thornton Park, MD      . metoCLOPramide (REGLAN) tablet 5 mg  5 mg Oral Q8H PRN Thornton Park, MD       Or  . metoCLOPramide (REGLAN) injection 5 mg  5 mg Intravenous Q8H PRN Thornton Park, MD      . multivitamin-lutein (OCUVITE-LUTEIN) capsule 1 capsule  1 capsule Oral BID Thornton Park, MD      . olopatadine (PATANOL) 0.1 % ophthalmic solution 1 drop  1 drop Both Eyes BID Thornton Park, MD   1 drop at 05/19/18 2332  . ondansetron (ZOFRAN) tablet 4 mg  4 mg Oral Q6H PRN Thornton Park, MD       Or  . ondansetron [facility]) injection 4 mg  4 mg Intravenous Q6H PRN Thornton Park, MD      .  oxyCODONE (Oxy IR/ROXICODONE) immediate release tablet 10 mg  10 mg Oral Q4H PRN Thornton Park, MD      . oxyCODONE (Oxy IR/ROXICODONE) immediate release tablet 5 mg  5 mg Oral Q4H PRN Thornton Park, MD      . polyethylene glycol (MIRALAX / GLYCOLAX) packet 17 g  17 g Oral Daily PRN Thornton Park, MD      . sodium chloride 0.9 % bolus 500 mL  500 mL Intravenous Once Thornton Park, MD      . traMADol [facility]at[facility]bon) tablet 50 mg  50 mg Oral Q6H Thornton Park, MD      .  vancomycin (VANCOCIN) IVPB 1000 mg/200 mL premix  1,000 mg Intravenous Q12H Thornton Park, MD         Discharge Medications: Please see discharge summary for a list of discharge medications.  Relevant Imaging Results:  Relevant Lab Results:   Additional Information SS# [id number]  Lezlie Octave [place], LCSW

## 2018-05-20 NOTE — Anesthesia Procedure Notes (Addendum)
Spinal  Patient location during procedure: OR Start time: 05/20/2018 10:31 AM End time: 05/20/2018 10:41 AM Staffing Anesthesiologist: Gunnar Fusi, MD Resident/CRNA: Hedda Slade, CRNA Performed: anesthesiologist and resident/CRNA  Preanesthetic Checklist Completed: patient identified, site marked, surgical consent, pre-op evaluation, timeout performed, IV checked, risks and benefits discussed and monitors and equipment checked Spinal Block Patient position: left lateral decubitus Prep: ChloraPrep and site prepped and draped Patient monitoring: heart rate, continuous pulse ox, blood pressure and cardiac monitor Approach: midline Location: L4-5 Injection technique: single-shot Needle Needle type: Introducer and [place]  Needle gauge: 24 G Needle length: 9 cm Needle insertion depth: 8.5 cm Additional Notes Negative paresthesia. Negative blood return. Positive free-flowing CSF. Expiration date of kit checked and confirmed. Patient tolerated procedure well, without complications.

## 2018-05-20 NOTE — Progress Notes (Addendum)
[facility] NAME: Alexandria Roman    MR#:  [id number]  DATE OF BIRTH:  12/24/1929  SUBJECTIVE:  CHIEF COMPLAINT:   Chief Complaint  Patient presents with   Fall   Leg Pain   Right hip pain. REVIEW OF SYSTEMS:  Review of Systems  Constitutional: Negative for chills, fever and malaise/fatigue.  HENT: Negative for sore throat.   Eyes: Negative for blurred vision and double vision.  Respiratory: Negative for cough, hemoptysis, shortness of breath, wheezing and stridor.   Cardiovascular: Negative for chest pain, palpitations, orthopnea and leg swelling.  Gastrointestinal: Negative for abdominal pain, blood in stool, diarrhea, melena, nausea and vomiting.  Genitourinary: Negative for dysuria, flank pain and hematuria.  Musculoskeletal: Negative for back pain and joint pain.       Right hip pain.  Skin: Negative for rash.  Neurological: Negative for dizziness, sensory change, focal weakness, seizures, loss of consciousness, weakness and headaches.  Endo/Heme/Allergies: Negative for polydipsia.  Psychiatric/Behavioral: Negative for depression. The patient is not nervous/anxious.     DRUG ALLERGIES:   Allergies  Allergen Reactions   Amoxicillin-Pot Clavulanate Diarrhea   Cephalexin Other (See Comments) and Nausea And Vomiting   Hydrochlorothiazide Other (See Comments) and Nausea And Vomiting   Hydrocodone-Chlorpheniramine Other (See Comments)   Hydrocodone-Homatropine Other (See Comments)   Metronidazole Other (See Comments)    Other reaction(s): Other (See Comments) Other Reaction: Not Assessed Other Reaction: Not Assessed   Moxifloxacin Other (See Comments)   Prednisone Nausea Only   Simvastatin Other (See Comments)   Sulfa Antibiotics Other (See Comments)    Other reaction(s): Unknown   Sulfasalazine Other (See Comments)    Other reaction(s): Unknown   Tramadol Other (See Comments)   Celecoxib Nausea  And Vomiting   Codeine Nausea Only    Other reaction(s): Other (See Comments) Other reaction(s): Unknown   Cyclobenzaprine Other (See Comments) and Nausea Only    Other reaction(s): Other (See Comments)   Oxycodone-Acetaminophen Nausea Only and Nausea And Vomiting   Penicillin V Potassium Rash    Other reaction(s): Other (See Comments) Other reaction(s): Unknown   VITALS:  Blood pressure 137/87, pulse 89, temperature 97.7 F (36.5 C), resp. rate 19, height 5\' 4"  (1.626 m), weight 76.2 kg, SpO2 93 %. PHYSICAL EXAMINATION:  Physical Exam Constitutional:      General: She is not in acute distress.    Appearance: Normal appearance.  HENT:     Head: Normocephalic.     Mouth/Throat:     Mouth: Mucous membranes are moist.  Eyes:     General: No scleral icterus.    Conjunctiva/sclera: Conjunctivae normal.     Pupils: Pupils are equal, round, and reactive to light.  Neck:     Musculoskeletal: Normal range of motion and neck supple.     Vascular: No JVD.     Trachea: No tracheal deviation.  Cardiovascular:     Rate and Rhythm: Normal rate and regular rhythm.     Heart sounds: Normal heart sounds. No murmur. No gallop.   Pulmonary:     Effort: Pulmonary effort is normal. No respiratory distress.     Breath sounds: Normal breath sounds. No wheezing or rales.  Abdominal:     General: Bowel sounds are normal. There is no distension.     Palpations: Abdomen is soft.     Tenderness: There is no abdominal tenderness. There is no rebound.  Musculoskeletal:  General: No tenderness.     Right lower leg: No edema.     Left lower leg: No edema.     Comments: Unable to move right leg.  Skin:    Findings: No erythema or rash.  Neurological:     Mental Status: She is alert and oriented to person, place, and time.     Cranial Nerves: No cranial nerve deficit.  Psychiatric:        Mood and Affect: Mood normal.    LABORATORY PANEL:  Female CBC Recent Labs  Lab 05/19/18 1931    WBC 9.9  HGB 14.3  HCT 42.3  PLT 280   ------------------------------------------------------------------------------------------------------------------ Chemistries  Recent Labs  Lab 05/19/18 1931  NA 137  K 3.3*  CL 103  CO2 22  GLUCOSE 107*  BUN 15  CREATININE 0.88  CALCIUM 8.9  MG 2.0  AST 19  ALT 12  ALKPHOS 54  BILITOT 0.7   RADIOLOGY:  Dg Chest 1 View  Result Date: 05/19/2018 CLINICAL DATA:  Fall earlier this evening. EXAM: CHEST  1 VIEW COMPARISON:  March 19, 2016 FINDINGS: A hiatal hernia is identified. The cardiomediastinal silhouette is stable. No pneumothorax. The lungs are clear. IMPRESSION: No active disease. Electronically Signed   By: Dorise Bullion III M.D   On: 05/19/2018 20:09   Ct Hip Right Wo Contrast  Result Date: 05/20/2018 CLINICAL DATA:  Right-sided hip pain status post fall yesterday. EXAM: CT OF THE RIGHT HIP WITHOUT CONTRAST TECHNIQUE: Multidetector CT imaging of the right hip was performed according to the standard protocol. Multiplanar CT image reconstructions were also generated. COMPARISON:  None. FINDINGS: Bones/Joint/Cartilage Generalized osteopenia. Mildly displaced right femoral neck fracture with mild apex anterior angulation and 4.9 mm superior displacement and 13 mm of anterior displacement. No other acute fracture or dislocation. No aggressive osseous lesion. No periosteal reaction or bone destruction. Right hip joint space is relatively well maintained. Ligaments Ligaments are suboptimally evaluated by CT. Muscles and Tendons Muscles are normal. No intramuscular fluid collection or hematoma. No significant muscle atrophy. Soft tissue No fluid collection or hematoma. No soft tissue mass. Foley catheter within a decompressed bladder. IMPRESSION: 1. Mildly displaced right femoral neck fracture with mild apex anterior angulation and 4.9 mm superior displacement and 13 mm of anterior displacement. Electronically Signed   By: Kathreen Devoid   On:  05/20/2018 11:51   Dg Hip Unilat W Or Wo Pelvis 2-3 Views Right  Result Date: 05/19/2018 CLINICAL DATA:  Right hip pain after fall. EXAM: DG HIP (WITH OR WITHOUT PELVIS) 2-3V RIGHT COMPARISON:  None. FINDINGS: There is a fracture through the base of the femoral neck on the right. The left hip is intact on a single frontal view. The remainder of the pelvic bones are intact. No dislocation noted. IMPRESSION: Fracture through the base of the right femoral neck. Electronically Signed   By: Dorise Bullion III M.D   On: 05/19/2018 20:08   ASSESSMENT AND PLAN:  This is an 85 year old female admitted for hip fracture. 1.  Hip fracture: Right femoral neck;  Pain control,  N.p.o. for surgery today. DVT prophylaxis and PT after surgery.  2.  Hypertension: Controlled; continue losartan.  Labetalol as needed prior to surgery. 3.  Hypothyroidism: elevated TSH; increase Synthroid dose. 4.  Hyperlipidemia: Continue statin therapy 5.  DVT prophylaxis: SCDs for now. Hypokalemia. Potassium supplement.  All the records are reviewed and case discussed with Care Management/Social Worker. Management plans discussed with the patient, family and  they are in agreement.  CODE STATUS: Full Code  TOTAL TIME TAKING CARE OF THIS PATIENT: 32 minutes.   More than 50% of the time was spent in counseling/coordination of care: YES  POSSIBLE D/C IN 2-3 DAYS, DEPENDING ON CLINICAL CONDITION.   Demetrios Loll M.D on 05/20/2018 at 11:57 AM  Between 7am to 6pm - Pager - [phone]  After 6pm go to [email or web address] - [facility]

## 2018-05-20 NOTE — Transfer of Care (Signed)
Immediate Anesthesia Transfer of Care Note  Patient: Alexandria Roman  Procedure(s) Performed: ARTHROPLASTY BIPOLAR HIP (HEMIARTHROPLASTY) (Right )  Patient Location: PACU  Anesthesia Type:Spinal  Level of Consciousness: awake, alert  and oriented  Airway & Oxygen Therapy: Patient Spontanous Breathing  Post-op Assessment: Report given to RN and Post -op Vital signs reviewed and stable  Post vital signs: Reviewed and stable  Last Vitals:  Vitals Value Taken Time  BP 111/71 05/20/2018  1:09 PM  Temp 36.3 C 05/20/2018  1:07 PM  Pulse 79 05/20/2018  1:09 PM  Resp 15 05/20/2018  1:09 PM  SpO2 94 % 05/20/2018  1:09 PM  Vitals shown include unvalidated device data.  Last Pain:  Vitals:   05/20/18 1309  TempSrc:   PainSc: 0-No pain      Patients Stated Pain Goal: 2 (16/38/46 6599)  Complications: No apparent anesthesia complications

## 2018-05-20 NOTE — Progress Notes (Signed)
Subjective:  Postop check: Patient reports right hip pain as mild.  Patient states that she still cannot feel her feet.    Objective:   VITALS:   Vitals:   05/20/18 1344 05/20/18 1353 05/20/18 1412 05/20/18 1442  BP: (!) 149/82 (!) 155/82 (!) 165/89 122/72  Pulse: 77 76 78 79  Resp: 12 18    Temp: 97.7 F (36.5 C)  (!) 97.4 F (36.3 C)   TempSrc:   Oral   SpO2: 98% 95% 92% 93%  Weight:      Height:        PHYSICAL EXAM: Patient is sitting up in bed and in no acute distress. Right lower extremity: Neurovascular intact Sensation intact distally Intact pulses distally Dorsiflexion/Plantar flexion intact Incision: no drainage No cellulitis present Compartment soft  LABS  Results for orders placed or performed during the hospital encounter of 05/19/18 (from the past 24 hour(s))  CBC with Differential     Status: None   Collection Time: 05/19/18  7:31 PM  Result Value Ref Range   WBC 9.9 4.0 - 10.5 K/uL   RBC 4.84 3.87 - 5.11 MIL/uL   Hemoglobin 14.3 12.0 - 15.0 g/dL   HCT 42.3 36.0 - 46.0 %   MCV 87.4 80.0 - 100.0 fL   MCH 29.5 26.0 - 34.0 pg   MCHC 33.8 30.0 - 36.0 g/dL   RDW 14.4 11.5 - 15.5 %   Platelets 280 150 - 400 K/uL   nRBC 0.0 0.0 - 0.2 %   Neutrophils Relative % 68 %   Neutro Abs 6.7 1.7 - 7.7 K/uL   Lymphocytes Relative 21 %   Lymphs Abs 2.1 0.7 - 4.0 K/uL   Monocytes Relative 9 %   Monocytes Absolute 0.9 0.1 - 1.0 K/uL   Eosinophils Relative 1 %   Eosinophils Absolute 0.1 0.0 - 0.5 K/uL   Basophils Relative 1 %   Basophils Absolute 0.1 0.0 - 0.1 K/uL   Immature Granulocytes 0 %   Abs Immature Granulocytes 0.04 0.00 - 0.07 K/uL  Comprehensive metabolic panel     Status: Abnormal   Collection Time: 05/19/18  7:31 PM  Result Value Ref Range   Sodium 137 135 - 145 mmol/L   Potassium 3.3 (L) 3.5 - 5.1 mmol/L   Chloride 103 98 - 111 mmol/L   CO2 22 22 - 32 mmol/L   Glucose, Bld 107 (H) 70 - 99 mg/dL   BUN 15 8 - 23 mg/dL   Creatinine, Ser 0.88  0.44 - 1.00 mg/dL   Calcium 8.9 8.9 - 10.3 mg/dL   Total Protein 6.9 6.5 - 8.1 g/dL   Albumin 4.1 3.5 - 5.0 g/dL   AST 19 15 - 41 U/L   ALT 12 0 - 44 U/L   Alkaline Phosphatase 54 38 - 126 U/L   Total Bilirubin 0.7 0.3 - 1.2 mg/dL   GFR calc non Af Amer 59 (L) >60 mL/min   GFR calc Af Amer >60 >60 mL/min   Anion gap 12 5 - 15  TSH     Status: Abnormal   Collection Time: 05/19/18  7:31 PM  Result Value Ref Range   TSH 8.782 (H) 0.350 - 4.500 uIU/mL  Magnesium     Status: None   Collection Time: 05/19/18  7:31 PM  Result Value Ref Range   Magnesium 2.0 1.7 - 2.4 mg/dL  Surgical PCR screen     Status: None   Collection Time: 05/19/18 11:02 PM  Result Value Ref Range   MRSA, PCR NEGATIVE NEGATIVE   Staphylococcus aureus NEGATIVE NEGATIVE  Protime-INR     Status: None   Collection Time: 05/20/18  9:23 AM  Result Value Ref Range   Prothrombin Time 13.3 11.4 - 15.2 seconds   INR 1.0 0.8 - 1.2  APTT     Status: None   Collection Time: 05/20/18  9:23 AM  Result Value Ref Range   aPTT 28 24 - 36 seconds    Dg Chest 1 View  Result Date: 05/19/2018 CLINICAL DATA:  Fall earlier this evening. EXAM: CHEST  1 VIEW COMPARISON:  March 19, 2016 FINDINGS: A hiatal hernia is identified. The cardiomediastinal silhouette is stable. No pneumothorax. The lungs are clear. IMPRESSION: No active disease. Electronically Signed   By: Dorise Bullion III M.D   On: 05/19/2018 20:09   Ct Hip Right Wo Contrast  Result Date: 05/20/2018 CLINICAL DATA:  Right-sided hip pain status post fall yesterday. EXAM: CT OF THE RIGHT HIP WITHOUT CONTRAST TECHNIQUE: Multidetector CT imaging of the right hip was performed according to the standard protocol. Multiplanar CT image reconstructions were also generated. COMPARISON:  None. FINDINGS: Bones/Joint/Cartilage Generalized osteopenia. Mildly displaced right femoral neck fracture with mild apex anterior angulation and 4.9 mm superior displacement and 13 mm of anterior  displacement. No other acute fracture or dislocation. No aggressive osseous lesion. No periosteal reaction or bone destruction. Right hip joint space is relatively well maintained. Ligaments Ligaments are suboptimally evaluated by CT. Muscles and Tendons Muscles are normal. No intramuscular fluid collection or hematoma. No significant muscle atrophy. Soft tissue No fluid collection or hematoma. No soft tissue mass. Foley catheter within a decompressed bladder. IMPRESSION: 1. Mildly displaced right femoral neck fracture with mild apex anterior angulation and 4.9 mm superior displacement and 13 mm of anterior displacement. Electronically Signed   By: Kathreen Devoid   On: 05/20/2018 11:51   Dg Hip Port Unilat With Pelvis 1v Right  Result Date: 05/20/2018 CLINICAL DATA:  Post right hip replacement EXAM: DG HIP (WITH OR WITHOUT PELVIS) 1V PORT RIGHT COMPARISON:  Right hip radiograph-05/19/2018 FINDINGS: Post bipolar right hip replacement.  Alignment appears anatomic. Skin staples overlie the superolateral aspect the right thigh. There is a minimal amount of expected adjacent subcutaneous emphysema. No radiopaque foreign body. No fracture. Limited visualization of the pelvis is normal. Moderate degenerative change of the contralateral left hip is suspected though incompletely evaluated Surgical clips overlie the lower pelvis bilaterally. IMPRESSION: Post right bipolar hip replacement without evidence of complication. Electronically Signed   By: Sandi Mariscal M.D.   On: 05/20/2018 13:52   Dg Hip Unilat W Or Wo Pelvis 2-3 Views Right  Result Date: 05/19/2018 CLINICAL DATA:  Right hip pain after fall. EXAM: DG HIP (WITH OR WITHOUT PELVIS) 2-3V RIGHT COMPARISON:  None. FINDINGS: There is a fracture through the base of the femoral neck on the right. The left hip is intact on a single frontal view. The remainder of the pelvic bones are intact. No dislocation noted. IMPRESSION: Fracture through the base of the right femoral  neck. Electronically Signed   By: Dorise Bullion III M.D   On: 05/19/2018 20:08    Assessment/Plan: Day of Surgery   Active Problems:   Hip fracture [facility])  Patient is stable postop.  Reviewed her postop x-ray which shows the prosthesis is well-positioned.  There is no evidence of fracture or dislocation.  Will complete 24 hours of postop antibiotics.  Continue vancomycin  given her allergies to cephalosporins and penicillin.  Patient will have her Foley catheter removed tomorrow.  She will begin physical therapy tomorrow.  Labs will be re-checked in the morning.    Thornton Park , MD 05/20/2018, 2:59 PM

## 2018-05-20 NOTE — Anesthesia Preprocedure Evaluation (Addendum)
Anesthesia Evaluation  Patient identified by MRN, date of birth, ID band Patient awake    Reviewed: Allergy & Precautions, NPO status , Patient's Chart, lab work & pertinent test results  History of Anesthesia Complications Negative for: history of anesthetic complications  Airway Mallampati: III       Dental  (+) Edentulous Upper, Edentulous Lower   Pulmonary neg sleep apnea, neg COPD,  Sinus congestion          Cardiovascular (-) hypertension(-) Past MI and (-) CHF (-) dysrhythmias (-) Valvular Problems/Murmurs     Neuro/Psych neg Seizures Anxiety Depression    GI/Hepatic Neg liver ROS, neg GERD  ,  Endo/Other  Hypothyroidism   Renal/GU Renal InsufficiencyRenal disease     Musculoskeletal   Abdominal   Peds  Hematology   Anesthesia Other Findings   Reproductive/Obstetrics                            Anesthesia Physical Anesthesia Plan  ASA: II and emergent  Anesthesia Plan: Spinal   Post-op Pain Management:    Induction:   PONV Risk Score and Plan:   Airway Management Planned:   Additional Equipment:   Intra-op Plan:   Post-operative Plan:   Informed Consent: I have reviewed the patients History and Physical, chart, labs and discussed the procedure including the risks, benefits and alternatives for the proposed anesthesia with the patient or authorized representative who has indicated his/her understanding and acceptance.       Plan Discussed with:   Anesthesia Plan Comments:         Anesthesia Quick Evaluation

## 2018-05-21 LAB — BASIC METABOLIC PANEL
Anion gap: 7 (ref 5–15)
BUN: 17 mg/dL (ref 8–23)
CO2: 25 mmol/L (ref 22–32)
Calcium: 8.4 mg/dL — ABNORMAL LOW (ref 8.9–10.3)
Chloride: 103 mmol/L (ref 98–111)
Creatinine, Ser: 0.82 mg/dL (ref 0.44–1.00)
GFR calc Af Amer: >60 mL/min (ref >60)
GFR calc non Af Amer: >60 mL/min (ref >60)
Glucose, Bld: 132 mg/dL — ABNORMAL HIGH (ref 70–99)
Potassium: 4.7 mmol/L (ref 3.5–5.1)
Sodium: 135 mmol/L (ref 135–145)

## 2018-05-21 LAB — CBC
HCT: 38 % (ref 36.0–46.0)
Hemoglobin: 12 g/dL (ref 12.0–15.0)
MCH: 29.5 pg (ref 26.0–34.0)
MCHC: 31.6 g/dL (ref 30.0–36.0)
MCV: 93.4 fL (ref 80.0–100.0)
Platelets: 204 10*3/uL (ref 150–400)
RBC: 4.07 MIL/uL (ref 3.87–5.11)
RDW: 14.9 % (ref 11.5–15.5)
WBC: 8.4 10*3/uL (ref 4.0–10.5)
nRBC: 0 % (ref 0.0–0.2)

## 2018-05-21 MED ORDER — GUAIFENESIN ER 600 MG PO TB12
600.0000 mg | ORAL_TABLET | Freq: Two times a day (BID) | ORAL | Status: DC
  Administered 2018-05-21 – 2018-05-23 (×5): 600 mg via ORAL
  Filled 2018-05-21 (×5): qty 1

## 2018-05-21 MED ORDER — SENNA 8.6 MG PO TABS: 1.0000 | ORAL_TABLET | Freq: Every day | ORAL | Status: DC | PRN

## 2018-05-21 NOTE — Progress Notes (Signed)
Pt alert and oriented but some what forgetful this morning. Pain control with oral pain medication. Pt was able to sleep in between care. Pt did require some acute oxygen at 2L for low oxygen saturation while sleeping. Surgical dressing dry and intact. IV infusing without difficulty.

## 2018-05-21 NOTE — Progress Notes (Signed)
Subjective:  Postop day #1 status post right hip hemiarthroplasty.  Patient reports right hip pain as mild.  Patient states she has been drowsy since surgery but has no other complaints.  Patient states she is able to participate with physical therapy today.  Objective:   VITALS:   Vitals:   05/20/18 2333 05/21/18 0415 05/21/18 0725 05/21/18 1149  BP: 114/60 138/63 109/62 113/65  Pulse: 89 94 91 97  Resp: 19 19    Temp: 97.8 F (36.6 C) 98.4 F (36.9 C) 98.3 F (36.8 C) 98.1 F (36.7 C)  TempSrc: Oral Oral Oral Oral  SpO2: 94% 92% 92% 96%  Weight:      Height:        PHYSICAL EXAM: Right lower extremity: Patient has abduction pillow between her legs.  Her dressing is clean dry and intact. Neurovascular intact Sensation intact distally Intact pulses distally Dorsiflexion/Plantar flexion intact Incision: dressing C/D/I No cellulitis present Compartment soft  LABS  Results for orders placed or performed during the hospital encounter of 05/19/18 (from the past 24 hour(s))  CBC     Status: None   Collection Time: 05/21/18  4:25 AM  Result Value Ref Range   WBC 8.4 4.0 - 10.5 K/uL   RBC 4.07 3.87 - 5.11 MIL/uL   Hemoglobin 12.0 12.0 - 15.0 g/dL   HCT 38.0 36.0 - 46.0 %   MCV 93.4 80.0 - 100.0 fL   MCH 29.5 26.0 - 34.0 pg   MCHC 31.6 30.0 - 36.0 g/dL   RDW 14.9 11.5 - 15.5 %   Platelets 204 150 - 400 K/uL   nRBC 0.0 0.0 - 0.2 %  Basic metabolic panel     Status: Abnormal   Collection Time: 05/21/18  4:25 AM  Result Value Ref Range   Sodium 135 135 - 145 mmol/L   Potassium 4.7 3.5 - 5.1 mmol/L   Chloride 103 98 - 111 mmol/L   CO2 25 22 - 32 mmol/L   Glucose, Bld 132 (H) 70 - 99 mg/dL   BUN 17 8 - 23 mg/dL   Creatinine, Ser 0.82 0.44 - 1.00 mg/dL   Calcium 8.4 (L) 8.9 - 10.3 mg/dL   GFR calc non Af Amer >60 >60 mL/min   GFR calc Af Amer >60 >60 mL/min   Anion gap 7 5 - 15    Dg Chest 1 View  Result Date: 05/19/2018 CLINICAL DATA:  Fall earlier this evening.  EXAM: CHEST  1 VIEW COMPARISON:  March 19, 2016 FINDINGS: A hiatal hernia is identified. The cardiomediastinal silhouette is stable. No pneumothorax. The lungs are clear. IMPRESSION: No active disease. Electronically Signed   By: Dorise Bullion III M.D   On: 05/19/2018 20:09   Ct Hip Right Wo Contrast  Result Date: 05/20/2018 CLINICAL DATA:  Right-sided hip pain status post fall yesterday. EXAM: CT OF THE RIGHT HIP WITHOUT CONTRAST TECHNIQUE: Multidetector CT imaging of the right hip was performed according to the standard protocol. Multiplanar CT image reconstructions were also generated. COMPARISON:  None. FINDINGS: Bones/Joint/Cartilage Generalized osteopenia. Mildly displaced right femoral neck fracture with mild apex anterior angulation and 4.9 mm superior displacement and 13 mm of anterior displacement. No other acute fracture or dislocation. No aggressive osseous lesion. No periosteal reaction or bone destruction. Right hip joint space is relatively well maintained. Ligaments Ligaments are suboptimally evaluated by CT. Muscles and Tendons Muscles are normal. No intramuscular fluid collection or hematoma. No significant muscle atrophy. Soft tissue No fluid collection  or hematoma. No soft tissue mass. Foley catheter within a decompressed bladder. IMPRESSION: 1. Mildly displaced right femoral neck fracture with mild apex anterior angulation and 4.9 mm superior displacement and 13 mm of anterior displacement. Electronically Signed   By: Kathreen Devoid   On: 05/20/2018 11:51   Dg Hip Port Unilat With Pelvis 1v Right  Result Date: 05/20/2018 CLINICAL DATA:  Post right hip replacement EXAM: DG HIP (WITH OR WITHOUT PELVIS) 1V PORT RIGHT COMPARISON:  Right hip radiograph-05/19/2018 FINDINGS: Post bipolar right hip replacement.  Alignment appears anatomic. Skin staples overlie the superolateral aspect the right thigh. There is a minimal amount of expected adjacent subcutaneous emphysema. No radiopaque foreign  body. No fracture. Limited visualization of the pelvis is normal. Moderate degenerative change of the contralateral left hip is suspected though incompletely evaluated Surgical clips overlie the lower pelvis bilaterally. IMPRESSION: Post right bipolar hip replacement without evidence of complication. Electronically Signed   By: Sandi Mariscal M.D.   On: 05/20/2018 13:52   Dg Hip Unilat W Or Wo Pelvis 2-3 Views Right  Result Date: 05/19/2018 CLINICAL DATA:  Right hip pain after fall. EXAM: DG HIP (WITH OR WITHOUT PELVIS) 2-3V RIGHT COMPARISON:  None. FINDINGS: There is a fracture through the base of the femoral neck on the right. The left hip is intact on a single frontal view. The remainder of the pelvic bones are intact. No dislocation noted. IMPRESSION: Fracture through the base of the right femoral neck. Electronically Signed   By: Dorise Bullion III M.D   On: 05/19/2018 20:08    Assessment/Plan: 1 Day Post-Op   Active Problems:   Hip fracture ([place])  Patient is stable postop.  Hemoglobin and hematocrit are stable.  Continue physical therapy as patient can tolerate.  Begin Lovenox today for DVT prophylaxis.  Foley catheter is been removed.  Patient is weightbearing as tolerated on the right lower extremity.  Patient should observe posterior hip precautions.    Thornton Park , MD 05/21/2018, 12:47 PM

## 2018-05-21 NOTE — Anesthesia Postprocedure Evaluation (Signed)
Anesthesia Post Note  Patient: Alexandria Roman  Procedure(s) Performed: ARTHROPLASTY BIPOLAR HIP (HEMIARTHROPLASTY) (Right )  Patient location during evaluation: Nursing Unit Anesthesia Type: Spinal Level of consciousness: oriented and awake and alert Pain management: pain level controlled Vital Signs Assessment: post-procedure vital signs reviewed and stable Respiratory status: spontaneous breathing and respiratory function stable Cardiovascular status: blood pressure returned to baseline and stable Postop Assessment: no headache, no backache, no apparent nausea or vomiting and patient able to bend at knees Anesthetic complications: no     Last Vitals:  Vitals:   05/21/18 0415 05/21/18 0725  BP: 138/63 109/62  Pulse: 94 91  Resp: 19   Temp: 36.9 C 36.8 C  SpO2: 92% 92%    Last Pain:  Vitals:   05/21/18 0725  TempSrc: Oral  PainSc:                  Alison Stalling

## 2018-05-21 NOTE — Progress Notes (Signed)
PT Cancellation Note  Patient Details Name: Alexandria Roman MRN: [id number] DOB: 1929-11-19   Cancelled Treatment:    Reason Eval/Treat Not Completed: Patient declined, no reason specified;Other (comment)(patient eating breakfast). Patient eating breakfast upon PT entering, initially refusing all PT, upon education agreeable to perform PT once breakfast has been finished.   Janna Arch, PT, DPT   05/21/2018, 9:12 AM

## 2018-05-21 NOTE — Progress Notes (Signed)
[facility] [place] NAME: Alexandria Roman    MR#:  [id number]  DATE OF BIRTH:  Dec 16, 1929  SUBJECTIVE:  CHIEF COMPLAINT:   Chief Complaint  Patient presents with  . Fall  . Leg Pain   Right hip pain and cough REVIEW OF SYSTEMS:  Review of Systems  Constitutional: Negative for chills, fever and malaise/fatigue.  HENT: Negative for sore throat.   Eyes: Negative for blurred vision and double vision.  Respiratory: Positive for cough. Negative for hemoptysis, shortness of breath, wheezing and stridor.   Cardiovascular: Negative for chest pain, palpitations, orthopnea and leg swelling.  Gastrointestinal: Negative for abdominal pain, blood in stool, diarrhea, melena, nausea and vomiting.  Genitourinary: Negative for dysuria, flank pain and hematuria.  Musculoskeletal: Negative for back pain and joint pain.       Right hip pain.  Skin: Negative for rash.  Neurological: Negative for dizziness, sensory change, focal weakness, seizures, loss of consciousness, weakness and headaches.  Endo/Heme/Allergies: Negative for polydipsia.  Psychiatric/Behavioral: Negative for depression. The patient is not nervous/anxious.     DRUG ALLERGIES:   Allergies  Allergen Reactions  . Amoxicillin-Pot Clavulanate Diarrhea  . Cephalexin Other (See Comments) and Nausea And Vomiting  . Hydrochlorothiazide Other (See Comments) and Nausea And Vomiting  . Hydrocodone-Chlorpheniramine Other (See Comments)  . Hydrocodone-Homatropine Other (See Comments)  . Metronidazole Other (See Comments)    Other reaction(s): Other (See Comments) Other Reaction: Not Assessed Other Reaction: Not Assessed  . Moxifloxacin Other (See Comments)  . Prednisone Nausea Only  . Simvastatin Other (See Comments)  . Sulfa Antibiotics Other (See Comments)    Other reaction(s): Unknown  . Sulfasalazine Other (See Comments)    Other reaction(s): Unknown  . Tramadol Other (See Comments)   . Celecoxib Nausea And Vomiting  . Codeine Nausea Only    Other reaction(s): Other (See Comments) Other reaction(s): Unknown  . Cyclobenzaprine Other (See Comments) and Nausea Only    Other reaction(s): Other (See Comments)  . Oxycodone-Acetaminophen Nausea Only and Nausea And Vomiting  . Penicillin V Potassium Rash    Other reaction(s): Other (See Comments) Other reaction(s): Unknown   VITALS:  Blood pressure 113/65, pulse 97, temperature 98.1 F (36.7 C), temperature source Oral, resp. rate 19, height 5\' 4"  (1.626 m), weight 76.2 kg, SpO2 96 %. PHYSICAL EXAMINATION:  Physical Exam Constitutional:      General: She is not in acute distress.    Appearance: Normal appearance.  HENT:     Head: Normocephalic.     Mouth/Throat:     Mouth: Mucous membranes are moist.  Eyes:     General: No scleral icterus.    Conjunctiva/sclera: Conjunctivae normal.     Pupils: Pupils are equal, round, and reactive to light.  Neck:     Musculoskeletal: Normal range of motion and neck supple.     Vascular: No JVD.     Trachea: No tracheal deviation.  Cardiovascular:     Rate and Rhythm: Normal rate and regular rhythm.     Heart sounds: Normal heart sounds. No murmur. No gallop.   Pulmonary:     Effort: Pulmonary effort is normal. No respiratory distress.     Breath sounds: Normal breath sounds. No wheezing or rales.  Abdominal:     General: Bowel sounds are normal. There is no distension.     Palpations: Abdomen is soft.     Tenderness: There is no abdominal tenderness. There is no rebound.  Musculoskeletal:        General: No tenderness.     Right lower leg: No edema.     Left lower leg: No edema.     Comments: Unable to move right leg.  Skin:    Findings: No erythema or rash.  Neurological:     Mental Status: She is alert and oriented to person, place, and time.     Cranial Nerves: No cranial nerve deficit.  Psychiatric:        Mood and Affect: Mood normal.    LABORATORY PANEL:   Female CBC Recent Labs  Lab 05/21/18 0425  WBC 8.4  HGB 12.0  HCT 38.0  PLT 204   ------------------------------------------------------------------------------------------------------------------ Chemistries  Recent Labs  Lab 05/19/18 1931 05/21/18 0425  NA 137 135  K 3.3* 4.7  CL 103 103  CO2 22 25  GLUCOSE 107* 132*  BUN 15 17  CREATININE 0.88 0.82  CALCIUM 8.9 8.4*  MG 2.0  --   AST 19  --   ALT 12  --   ALKPHOS 54  --   BILITOT 0.7  --    RADIOLOGY:  Dg Hip Port Unilat With Pelvis 1v Right  Result Date: 05/20/2018 CLINICAL DATA:  Post right hip replacement EXAM: DG HIP (WITH OR WITHOUT PELVIS) 1V PORT RIGHT COMPARISON:  Right hip radiograph-05/19/2018 FINDINGS: Post bipolar right hip replacement.  Alignment appears anatomic. Skin staples overlie the superolateral aspect the right thigh. There is a minimal amount of expected adjacent subcutaneous emphysema. No radiopaque foreign body. No fracture. Limited visualization of the pelvis is normal. Moderate degenerative change of the contralateral left hip is suspected though incompletely evaluated Surgical clips overlie the lower pelvis bilaterally. IMPRESSION: Post right bipolar hip replacement without evidence of complication. Electronically Signed   By: Sandi Mariscal M.D.   On: 05/20/2018 13:52   ASSESSMENT AND PLAN:  This is an 85 year old female admitted for hip fracture. 1.  Hip fracture: Right femoral neck;  Pain control, DVT prophylaxis with Lovenox and PT.  2.  Hypertension: Controlled; continue losartan.  Labetalol as needed prior to surgery. 3.  Hypothyroidism: elevated TSH; increased Synthroid dose. 4.  Hyperlipidemia: Continue statin therapy Hypokalemia.  Improved with potassium supplement. PT evaluation recommend SNF. All the records are reviewed and case discussed with Care Management/Social Worker. Management plans discussed with the patient, family and they are in agreement.  CODE STATUS: Full Code   TOTAL TIME TAKING CARE OF THIS PATIENT: 26 minutes.   More than 50% of the time was spent in counseling/coordination of care: YES  POSSIBLE D/C IN 2 DAYS, DEPENDING ON CLINICAL CONDITION.   Demetrios Loll M.D on 05/21/2018 at 1:13 PM  Between 7am to 6pm - Pager - [phone]  After 6pm go to [email or web address] - [employer] [facility]

## 2018-05-21 NOTE — TOC Initial Note (Signed)
Transition of Care [facility]) - Initial/Assessment Note    Patient Details  Name: Alexandria Roman MRN: [id number] Date of Birth: Oct 26, 1929  Transition of Care [facility]) CM/SW Contact:    Zettie Pho, LCSW Phone Number: 05/21/2018, 2:19 PM  Clinical Narrative:   The CSW met with the patient at bedside to introduce self and role in care. The CSW explained the PT recommendation for SNF and provided a list of Medicare.gov approved facilities. The patient verbalized permission to begin referral and named [facility] as her preference due to location. The patient shared that her main barrier to discharge to SNF rather than HH is her dog. The patient currently has a rollator for DME, and her friend Inocente Salles is her HCPOA and assists the patient with transport to and from local resources including medical appointments. The patient shared that Sam may be able to care for her dog while she is at [facility] or may be able to assist her in the home.  The patient currently lives at [facility], a retirement community, where she has access to life alert style systems in place should she need assistance.  Should the patient choose to return home with home health, she may need a rolling walker and BSC. The [facility] team will provide bed offers as available. TOC team is following.          Expected Discharge Plan: Skilled Nursing Facility Barriers to Discharge: SNF Pending bed offer, Continued Medical Work up   Patient Goals and CMS Choice Patient states their goals for this hospitalization and ongoing recovery are:: "I want to get home and be able to take care of my dog." CMS Medicare.gov Compare Post Acute Care list provided to:: Patient Choice offered to / list presented to : Patient  Expected Discharge Plan and Services Expected Discharge Plan: [place] In-house Referral: NA Discharge Planning Services: NA Post Acute Care Choice: Nursing Home, Home Health Living arrangements for the past 2 months:  [place]                 DME Arranged: N/A DME Agency: [facility] HH Arranged: NA Holiday Agency: NA  Prior Living Arrangements/Services Living arrangements for the past 2 months: [place] Lives with:: Self Patient language and need for interpreter reviewed:: Yes Do you feel safe going back to the place where you live?: Yes      Need for Family Participation in Patient Care: No (Comment) Care giver support system in place?: Yes (comment) Current home services: DME Criminal Activity/Legal Involvement Pertinent to Current Situation/Hospitalization: No - Comment as needed  Activities of Daily Living Home Assistive Devices/Equipment: None ADL Screening (condition at time of admission) Patient's cognitive ability adequate to safely complete daily activities?: Yes Is the patient deaf or have difficulty hearing?: Yes Does the patient have difficulty seeing, even when wearing glasses/contacts?: No Does the patient have difficulty concentrating, remembering, or making decisions?: Yes Patient able to express need for assistance with ADLs?: Yes Does the patient have difficulty dressing or bathing?: No Independently performs ADLs?: Yes (appropriate for developmental age) Does the patient have difficulty walking or climbing stairs?: Yes Weakness of Legs: Right Weakness of Arms/Hands: None  Permission Sought/Granted Permission sought to share information with : Family Supports, Chartered certified accountant granted to share information with : Yes, Verbal Permission Granted              Emotional Assessment Appearance:: Appears stated age Attitude/Demeanor/Rapport: Engaged, Charismatic, Gracious Affect (typically  observed): Apprehensive, Stable, Pleasant Orientation: : Oriented to Self, Oriented to Place, Oriented to Situation, Oriented to  Time Alcohol / Substance Use: Never Used Psych Involvement: No (comment)  Admission diagnosis:   Closed fracture of neck of right femur, initial encounter ([place]) [S72.001A] Patient Active Problem List   Diagnosis Date Noted  . Hip fracture ([place]) 05/19/2018  . Depression with anxiety 12/09/2016  . Numbness of foot 12/09/2016  . Macular degeneration 12/09/2016  . Gait instability 12/08/2016  . Upper respiratory infection, viral 12/06/2016  . Primary osteoarthritis of both knees 07/20/2016  . Postmenopausal osteoporosis 04/15/2016  . Chest pain 03/19/2016  . Spinal stenosis 08/04/2015  . Myalgia 06/11/2015  . Polyarthralgia 06/11/2015  . Chronic midline low back pain with bilateral sciatica 01/10/2015  . Benign essential hypertension 06/04/2014  . CKD (chronic kidney disease) stage 3, GFR 30-59 ml/min (HCC) 06/04/2014  . Mixed hyperlipidemia 06/04/2014  . Other allergic rhinitis 06/04/2014  . DDD (degenerative disc disease), lumbar 09/07/2013  . Lumbar radiculitis 09/07/2013  . Hypothyroidism, unspecified 08/02/2013   PCP:  Glendon Axe, MD Pharmacy:   [facility] [address]), [place] [address] [place]) [place] [address] Phone: [phone] Fax: [place], [place] - [address] [place] [address] Phone: [phone] Fax: [phone]     Social Determinants of Health (SDOH) Interventions    Readmission Risk Interventions No flowsheet data found.

## 2018-05-21 NOTE — Evaluation (Signed)
Physical Therapy Evaluation Patient Details Name: Alexandria Roman MRN: [id number] DOB: [birth year] Today's Date: 05/21/2018   History of Present Illness  Patient is a pleasantly confused 85 year old female who presents to the Ed s/p fall resulting in R hip hemiarthroplasty. PMH includes HTN, endometrial cancer, mac. degeneration, myalgia, and recent vertigo. Patient has intermittent confusion with tangential nature making history challenging to obtain.   Clinical Impression  Patient is a pleasantly confused 85 year old female who presents with limited mobility and pain secondary to s/p R hip hemiarthroplasty and vertigo/dizziness. Patient had intermittent confusion with tangential notes of conversation.Patient initially on phone with significant other/boyfriend Sam who lives a few houses from her. SO very distraught about patient. Patient initially reluctant to attempt PT however agreeable to attempt after education on importance of getting up s/p surgery. Patient very limited in RLE movement and required Mod A for sitting EOB, upon sitting EOB patient required BUE support and had onset of dizziness, vitals were [facility] so potentially was due to hx of vertigo. Patient required time in sitting for dizziness to pass prior to standing. Standing transfer unsuccessful from lower surface, requiring bed to be raised. Upon raising bed patient demonstrated ability to transfer with CGA/MinA and max verbal cueing for hand placement. Patient unable to stand fully upright in standing and became dizzy requiring patient to sit down. Patient unwilling to attempt transfer to recliner chair. Patient will benefit from skilled physical therapy to improve transfers, mobility, and decrease falls risk. Patient will benefit from SNF placement upon d/c due to lack of caregiver support at home, limited mobility, increased falls risk, and poor LE strength.     Follow Up Recommendations SNF    Equipment Recommendations  3in1 (PT)     Recommendations for Other Services OT consult     Precautions / Restrictions Precautions Precaution Comments: R hip hemiarthroplasty Restrictions Weight Bearing Restrictions: Yes Other Position/Activity Restrictions: WBAT      Mobility  Bed Mobility Overal bed mobility: Needs Assistance Bed Mobility: Rolling;Supine to Sit;Sit to Supine Rolling: Min assist(challenged with movement of RLE)   Supine to sit: Mod assist Sit to supine: Mod assist(needs assistance with RLE )   General bed mobility comments: Patient unable to lift RLE onto/off of bed without Mod A, required assistance for trunk stability   Transfers Overall transfer level: Needs assistance   Transfers: Sit to/from Stand;Lateral/Scoot Transfers Sit to Stand: Min guard;From elevated surface;Min assist        Lateral/Scoot Transfers: Mod assist General transfer comment: Patient required MIn-Mod A for STS from raised surface, unable to perform at standard height surface, max cueing for hand placement. lateral and anterior/posterior scooting on EOB for placement required mod A due to difficulty shifting weight  Ambulation/Gait             General Gait Details: Patient refused, felt dizzy and sat back down upon standing  Stairs            Wheelchair Mobility    Modified Rankin (Stroke Patients Only)       Balance Overall balance assessment: Needs assistance;History of Falls Sitting-balance support: Bilateral upper extremity supported Sitting balance-Leahy Scale: Poor Sitting balance - Comments: Patient requires BUE in seated position, initially dizzy which faded ~2 minutes Postural control: Posterior lean Standing balance support: Bilateral upper extremity supported Standing balance-Leahy Scale: Poor Standing balance comment: Patient required BUE support and was challenged maintaining upright position, became dizzy and had to sit down.  Pertinent  Vitals/Pain Pain Assessment: 0-10 Pain Score: 7  Pain Location: R hip with movement Pain Descriptors / Indicators: Stabbing Pain Intervention(s): Utilized relaxation techniques;Monitored during session;Repositioned    Home Living Family/patient expects to be discharged to:: Assisted living([facility])               Home Equipment: Environmental consultant - 2 wheels;Cane - single point;Shower seat;Grab bars - toilet;Grab bars - tub/shower Additional Comments: Patient reports she has a walk in shower, grab bars in the bathroom, and house is accessible.     Prior Function Level of Independence: Independent with assistive device(s)         Comments: Patient reports she lives alone with her dog "maggie". Her boyfriend/significant other Sam was on the phone and talked with PT (he lives one house down), was not using an AD up until her vertigo set in a few weeks ago.      Hand Dominance        Extremity/Trunk Assessment   Upper Extremity Assessment Upper Extremity Assessment: Defer to OT evaluation    Lower Extremity Assessment Lower Extremity Assessment: Generalized weakness;RLE deficits/detail;LLE deficits/detail RLE Deficits / Details: patient unable to perform full ROM due to surgery, pain, and confusion  RLE: Unable to fully assess due to pain RLE Sensation: decreased light touch RLE Coordination: decreased gross motor LLE Deficits / Details: grossly 3+/5, difficult to assess due to fear of movement and pain LLE: Unable to fully assess due to pain LLE Sensation: WNL LLE Coordination: decreased gross motor       Communication   Communication: No difficulties  Cognition Arousal/Alertness: Awake/alert Behavior During Therapy: WFL for tasks assessed/performed;Anxious Overall Cognitive Status: No family/caregiver present to determine baseline cognitive functioning                                 General Comments: Patient had intermittent confusion with  tangential nature. Was oriented however became confused with prolonged talking.       General Comments General comments (skin integrity, edema, etc.): Dizziness with movement limiting tasks, no noticeable bruising in LE's. surgical site dressing clean/intact    Exercises Other Exercises Other Exercises: supine: ankle pumps 10x, muscle activation of LE's, breathing in through nose for SP02 level  Other Exercises: sitting EOB balance, breathing education, and transfer education,  Other Exercises: transition education on proper supine to sit and sit to supine with decreasing pain,    Assessment/Plan    PT Assessment Patient needs continued PT services  PT Problem List Decreased strength;Decreased range of motion;Decreased activity tolerance;Decreased balance;Decreased mobility;Decreased coordination;Decreased safety awareness;Decreased cognition;Impaired sensation;Pain       PT Treatment Interventions DME instruction;Gait training;Therapeutic exercise;Neuromuscular re-education;Therapeutic activities;Functional mobility training;Balance training;Manual techniques;Patient/family education    PT Goals (Current goals can be found in the Care Plan section)  Acute Rehab PT Goals Patient Stated Goal: to return home to my dog PT Goal Formulation: With patient Time For Goal Achievement: 06/04/18 Potential to Achieve Goals: Fair    Frequency BID   Barriers to discharge Decreased caregiver support Patient lives alone    Co-evaluation               AM-PAC PT "6 Clicks" Mobility  Outcome Measure Help needed turning from your back to your side while in a flat bed without using bedrails?: A Lot Help needed moving from lying on your back to sitting on the side of a flat  bed without using bedrails?: A Lot Help needed moving to and from a bed to a chair (including a wheelchair)?: A Lot Help needed standing up from a chair using your arms (e.g., wheelchair or bedside chair)?: A Lot Help  needed to walk in hospital room?: A Lot Help needed climbing 3-5 steps with a railing? : Total 6 Click Score: 11    End of Session Equipment Utilized During Treatment: Gait belt;Oxygen(2 L02 via nasal cannula) Activity Tolerance: Patient limited by pain;Other (comment)(confusion/dizziness) Patient left: in bed;with call bell/phone within reach;with SCD's reapplied;with bed alarm set Nurse Communication: Mobility status;Other (comment)(dizziness) PT Visit Diagnosis: Unsteadiness on feet (R26.81);Other abnormalities of gait and mobility (R26.89);Repeated falls (R29.6);Muscle weakness (generalized) (M62.81);Pain;Dizziness and giddiness (R42) Pain - Right/Left: Right Pain - part of body: Hip    Time: 0922-1001 PT Time Calculation (min) (ACUTE ONLY): 39 min   Charges:   PT Evaluation $PT Eval Low Complexity: 1 Low PT Treatments $Therapeutic Activity: 23-37 mins        Janna Arch, PT, DPT   Janna Arch 05/21/2018, 10:36 AM

## 2018-05-22 ENCOUNTER — Encounter: Payer: Self-pay | Admitting: Orthopedic Surgery

## 2018-05-22 ENCOUNTER — Inpatient Hospital Stay: Payer: Medicare Other

## 2018-05-22 LAB — BASIC METABOLIC PANEL
Anion gap: 7 (ref 5–15)
BUN: 15 mg/dL (ref 8–23)
CO2: 25 mmol/L (ref 22–32)
Calcium: 8.1 mg/dL — ABNORMAL LOW (ref 8.9–10.3)
Chloride: 101 mmol/L (ref 98–111)
Creatinine, Ser: 0.82 mg/dL (ref 0.44–1.00)
GFR calc Af Amer: >60 mL/min (ref >60)
GFR calc non Af Amer: >60 mL/min (ref >60)
Glucose, Bld: 135 mg/dL — ABNORMAL HIGH (ref 70–99)
Potassium: 4.4 mmol/L (ref 3.5–5.1)
Sodium: 133 mmol/L — ABNORMAL LOW (ref 135–145)

## 2018-05-22 LAB — CBC
HCT: 34.2 % — ABNORMAL LOW (ref 36.0–46.0)
Hemoglobin: 11 g/dL — ABNORMAL LOW (ref 12.0–15.0)
MCH: 29.6 pg (ref 26.0–34.0)
MCHC: 32.2 g/dL (ref 30.0–36.0)
MCV: 91.9 fL (ref 80.0–100.0)
Platelets: 191 10*3/uL (ref 150–400)
RBC: 3.72 MIL/uL — ABNORMAL LOW (ref 3.87–5.11)
RDW: 14.8 % (ref 11.5–15.5)
WBC: 10.9 10*3/uL — ABNORMAL HIGH (ref 4.0–10.5)
nRBC: 0 % (ref 0.0–0.2)

## 2018-05-22 MED ORDER — FUROSEMIDE 10 MG/ML IJ SOLN
20.0000 mg | Freq: Two times a day (BID) | INTRAMUSCULAR | Status: DC
  Administered 2018-05-22 – 2018-05-23 (×2): 20 mg via INTRAVENOUS
  Filled 2018-05-22 (×2): qty 2

## 2018-05-22 MED ORDER — IPRATROPIUM-ALBUTEROL 0.5-2.5 (3) MG/3ML IN SOLN: 3.0000 mL | RESPIRATORY_TRACT | Status: DC | PRN

## 2018-05-22 NOTE — Progress Notes (Signed)
PT Cancellation Note  Patient Details Name: Alexandria Roman MRN: [id number] DOB: 12/29/1929   Cancelled Treatment:    Reason Eval/Treat Not Completed: Fatigue/lethargy limiting ability to participate;Patient declined, no reason specified(Patient states shes too tired). Patient asleep upon PT second attempt of the afternoon. Patient woke up and reported she was too tired to participate. Attempted to educate patient on need for PT however patient declined. Will attempt again at later time/date.   Janna Arch, PT, DPT   05/22/2018, 2:14 PM

## 2018-05-22 NOTE — TOC Progression Note (Signed)
Transition of Care [facility]) - Progression Note    Patient Details  Name: KYLII ENNIS MRN: [id number] Date of Birth: [birth year]  [facility]) CM/SW Contact  Su Hilt, RN Phone Number: 05/22/2018, 10:14 AM  Clinical Narrative:    Darryll Capers called and stated that he has the [facility], he wants the patient to go home I explained to him regardless who has HCPOA the patient is alert and oriented and can make her own decisions.  He gave me her attorney's phone number [phone], the attorney's name is Timmothy Sours, I contacted the attorney and requested a copy of the HCPOA, I gave the fax number to the attorney and he stated that he is the one with the [facility] and he will fax a copy of it, Darryll Capers is a friend and has no say over anything.  I explained that the patient is Alert and Oriented, Mr. Rosana Hoes explained that he agrees that she make her own choices.  I explained the plan is to go to Short term rehab tomorrow, he stated that he thought that was a great plan for her.He stated that he would call Mr. Mervyn Skeeters and explain the legality of HCPOA and what it means as well as the patient making her own choices   Expected Discharge Plan: [place] Barriers to Discharge: SNF Pending bed offer, Continued Medical Work up  Expected Discharge Plan and Services Expected Discharge Plan: [place] In-house Referral: NA Discharge Planning Services: NA Post Acute Care Choice: Nursing Home, Home Health Living arrangements for the past 2 months: [place]                 DME Arranged: N/A DME Agency: [facility] HH Arranged: NA HH Agency: NA   Social Determinants of Health (SDOH) Interventions    Readmission Risk Interventions No flowsheet data found.

## 2018-05-22 NOTE — Progress Notes (Signed)
PT Cancellation Note  Patient Details Name: Alexandria Roman MRN: [id number] DOB: Mar 20, 1929   Cancelled Treatment:    Reason Eval/Treat Not Completed: Fatigue/lethargy limiting ability to participate(patient sleeping.) Patient not rousing to verbal and tactile attempts. Will try again at later time/date.  Janna Arch, PT, DPT   05/22/2018, 1:55 PM

## 2018-05-22 NOTE — TOC Progression Note (Signed)
Transition of Care [facility]) - Progression Note    Patient Details  Name: Alexandria Roman MRN: [id number] Date of Birth: 1929/05/18  Transition of Care [facility] CM/SW [place], RN Phone Number: 05/22/2018, 1:29 PM  Clinical Narrative:    Otila Kluver from [employer] messaged me to notify me that they started [employer]   Expected Discharge Plan: [place] Barriers to Discharge: SNF Pending bed offer, Continued Medical Work up  Expected Discharge Plan and Services Expected Discharge Plan: [place] In-house Referral: NA Discharge Planning Services: NA Post Acute Care Choice: [place] arrangements for the past 2 months: [place]                 DME Arranged: N/A DME Agency: [facility] HH Arranged: NA HH Agency: NA   Social Determinants of Health (SDOH) Interventions    Readmission Risk Interventions No flowsheet data found.

## 2018-05-22 NOTE — TOC Progression Note (Signed)
Transition of Care [facility]) - Progression Note    Patient Details  Name: Alexandria Roman MRN: [id number] Date of Birth: October 01, 1929  Transition of [facility] CM/SW Contact  Su Hilt, RN Phone Number: 05/22/2018, 11:36 AM  Clinical Narrative:    Spoke with the patient and offered choice the patient states that she will go to [facility] for short term rehab for a few weeks.  She stated that she knows that she needs rehab to get stronger. I spoke with Otila Kluver and Tammy at [facility] and notified them that she accepted the bed and will be ready for DC probably tomorrow to please start Auth,   Expected Discharge Plan: [place] Barriers to Discharge: SNF Pending bed offer, Continued Medical Work up  Expected Discharge Plan and Services Expected Discharge Plan: [place] In-house Referral: NA Discharge Planning Services: NA Post Acute Care Choice: [place] arrangements for the past 2 months: [place]                 DME Arranged: N/A DME Agency: [facility] HH Arranged: NA HH Agency: NA   Social Determinants of Health (SDOH) Interventions    Readmission Risk Interventions No flowsheet data found.

## 2018-05-22 NOTE — Care Management Important Message (Signed)
Important Message  Patient Details  Name: Alexandria Roman MRN: [id number] Date of Birth: 01-29-1930   Medicare Important Message Given:  Yes    Juliann Pulse A Markelle Najarian 05/22/2018, 11:39 AM

## 2018-05-22 NOTE — Progress Notes (Signed)
Physical Therapy Treatment Patient Details Name: Alexandria Roman MRN: [id number] DOB: 04-25-1929 Today's Date: 05/22/2018    History of Present Illness Patient is a pleasantly confused 85 year old female who presents to the Ed s/p fall resulting in R hip hemiarthroplasty. PMH includes HTN, endometrial cancer, mac. degeneration, myalgia, and recent vertigo. Patient has intermittent confusion with tangential nature making history challenging to obtain.     PT Comments    Patient is upset on phone upon PT arrival. [place] phone to PT to talk to significant other, attempted to tell significant other to discuss placement with case manager due to patient not wanting to go to [facility] since her husband passed away there. Significant other very verbose and not willing to end conversation despite attempts at education. Upon finishing call patient agreeable to sit up, improved supine to sit performed however patient was unable to perform sit to stand transfer that she had performed the day prior. Patient required SP02 monitoring and her Sp02 dropped to <85% upon attempt at mobility. Attempted to contact nurse after session to report oxygen desaturation with movement. Mod-Max assist required to bedside commode, with max upon return due to pain and fatigue. Patient requires assistance x 2 to reposition in bed due to pain and fatigue. Current POC remains appropriate.    Follow Up Recommendations  SNF     Equipment Recommendations  3in1 (PT)    Recommendations for Other Services OT consult     Precautions / Restrictions Precautions Precaution Comments: R hip hemiarthroplasty Restrictions Weight Bearing Restrictions: Yes Other Position/Activity Restrictions: WBAT    Mobility  Bed Mobility Overal bed mobility: Needs Assistance Bed Mobility: Rolling;Supine to Sit;Sit to Supine Rolling: Min assist(challenged with movement of RLE)   Supine to sit: Min assist Sit to supine: Mod assist(needs  assistance with RLE )   General bed mobility comments: Unable to lift RLE back onto bed, improved supine to sit position.   Transfers Overall transfer level: Needs assistance Equipment used: Standard walker Transfers: Stand Pivot Transfers   Stand pivot transfers: Mod assist;Max assist       General transfer comment: Requires Mod-Max assist for SPT to bedside commode from raised surface.   Ambulation/Gait             General Gait Details: patient refused   Stairs             Wheelchair Mobility    Modified Rankin (Stroke Patients Only)       Balance Overall balance assessment: Needs assistance;History of Falls Sitting-balance support: Bilateral upper extremity supported Sitting balance-Leahy Scale: Poor Sitting balance - Comments: Patient requires BUE in seated position, initially dizzy which faded ~2 minutes Postural control: Posterior lean Standing balance support: Bilateral upper extremity supported Standing balance-Leahy Scale: Poor Standing balance comment: Patient required BUE support and was challenged maintaining upright position, became dizzy and had to sit down.                             Cognition Arousal/Alertness: Awake/alert Behavior During Therapy: WFL for tasks assessed/performed;Anxious Overall Cognitive Status: No family/caregiver present to determine baseline cognitive functioning                                 General Comments: Pt noted to have intermittent confusion with tangential nature during prior PT session. Pt oriendted t/o OT evaluation, however, was noted  to close eyes intermittently and occasionally lose track of conversation.      Exercises Other Exercises Other Exercises: stand pivot transfer to bedside commode with mod-max assist. Unable to clean self. Require max verbal cueing to breath through nose for improved Sp02 levels Other Exercises: bed mobility: rolling R and L for cleaning/pad  placement. Patient requires assistance x 2 for moving up in bed. Other Exercises: education on importance of safe transfers Other Exercises: Supine to sit x 2 with decreasing assistance second trial.     General Comments General comments (skin integrity, edema, etc.): sp02 dropping to <85% with movement, improved with verbal cueing to breath through nose      Pertinent Vitals/Pain Pain Assessment: 0-10 Pain Score: 7  Pain Location: R hip with movement/ R shoulder Pain Descriptors / Indicators: Grimacing Pain Intervention(s): Monitored during session    Home Living Family/patient expects to be discharged to:: Assisted living([facility] )             Home Equipment: Environmental consultant - 2 wheels;Cane - single point;Shower seat;Grab bars - toilet;Grab bars - tub/shower Additional Comments: Patient reports she has a walk in shower, grab bars in the bathroom, and house is accessible.     Prior Function Level of Independence: Independent with assistive device(s)      Comments: Patient reports she lives alone with her dog "maggie" and has a significant other who lives one house down. Pt endorses 3 falls in the past year. Pt stated she was not using an AD up until her vertigo set in a few weeks ago.    PT Goals (current goals can now be found in the care plan section) Acute Rehab PT Goals Patient Stated Goal: to return home to my dog Progress towards PT goals: Not progressing toward goals - comment    Frequency    BID      PT Plan      Co-evaluation              AM-PAC PT "6 Clicks" Mobility   Outcome Measure  Help needed turning from your back to your side while in a flat bed without using bedrails?: A Lot Help needed moving from lying on your back to sitting on the side of a flat bed without using bedrails?: A Lot Help needed moving to and from a bed to a chair (including a wheelchair)?: A Lot Help needed standing up from a chair using your arms (e.g., wheelchair or  bedside chair)?: A Lot Help needed to walk in hospital room?: A Lot Help needed climbing 3-5 steps with a railing? : Total 6 Click Score: 11    End of Session Equipment Utilized During Treatment: Gait belt;Oxygen(2 L02 via nasal cannula) Activity Tolerance: Patient limited by pain;Other (comment)(confusion/dizziness) Patient left: in bed;with call bell/phone within reach;with SCD's reapplied;with bed alarm set Nurse Communication: Mobility status;Other (comment)(dizziness) PT Visit Diagnosis: Unsteadiness on feet (R26.81);Other abnormalities of gait and mobility (R26.89);Repeated falls (R29.6);Muscle weakness (generalized) (M62.81);Pain;Dizziness and giddiness (R42) Pain - Right/Left: Right Pain - part of body: Hip     Time: 4742-5956 PT Time Calculation (min) (ACUTE ONLY): 44 min  Charges:  $Therapeutic Exercise: 8-22 mins $Therapeutic Activity: 23-37 mins                     Janna Arch, PT, DPT     Janna Arch 05/22/2018, 11:35 AM

## 2018-05-22 NOTE — Progress Notes (Signed)
  Subjective:  POD #2 s/p right hip hemiarthroplasty.  Patient sleeping, but arousable.   Answers questions appropriately and follows commands.  Patient reports right pain as minimal at rest and moderate with movement.  Patient states she worked with PT this AM.  She is disappointed that she is going to [facility] upon discharge.    Objective:   VITALS:   Vitals:   05/21/18 1149 05/21/18 1544 05/21/18 2319 05/22/18 0814  BP: 113/65 (!) 113/59 (!) 134/49 130/78  Pulse: 97 (!) 105 (!) 105 99  Resp:   20 17  Temp: 98.1 F (36.7 C) 99.2 F (37.3 C) 99.9 F (37.7 C) 98.5 F (36.9 C)  TempSrc: Oral Oral Oral Oral  SpO2: 96% 96% 95% 96%  Weight:      Height:        PHYSICAL EXAM: Right lower extremity: Neurovascular intact Sensation intact distally Intact pulses distally Dorsiflexion/Plantar flexion intact Incision: dressing C/D/I No cellulitis present Compartment soft  LABS  Results for orders placed or performed during the hospital encounter of 05/19/18 (from the past 24 hour(s))  CBC     Status: Abnormal   Collection Time: 05/22/18  4:37 AM  Result Value Ref Range   WBC 10.9 (H) 4.0 - 10.5 K/uL   RBC 3.72 (L) 3.87 - 5.11 MIL/uL   Hemoglobin 11.0 (L) 12.0 - 15.0 g/dL   HCT 34.2 (L) 36.0 - 46.0 %   MCV 91.9 80.0 - 100.0 fL   MCH 29.6 26.0 - 34.0 pg   MCHC 32.2 30.0 - 36.0 g/dL   RDW 14.8 11.5 - 15.5 %   Platelets 191 150 - 400 K/uL   nRBC 0.0 0.0 - 0.2 %  Basic metabolic panel     Status: Abnormal   Collection Time: 05/22/18  4:37 AM  Result Value Ref Range   Sodium 133 (L) 135 - 145 mmol/L   Potassium 4.4 3.5 - 5.1 mmol/L   Chloride 101 98 - 111 mmol/L   CO2 25 22 - 32 mmol/L   Glucose, Bld 135 (H) 70 - 99 mg/dL   BUN 15 8 - 23 mg/dL   Creatinine, Ser 0.82 0.44 - 1.00 mg/dL   Calcium 8.1 (L) 8.9 - 10.3 mg/dL   GFR calc non Af Amer >60 >60 mL/min   GFR calc Af Amer >60 >60 mL/min   Anion gap 7 5 - 15    No results found.  Assessment/Plan: 2 Days  Post-Op   Active Problems:   Hip fracture [facility])  Patient is doing well from an orthopaedic standpoint.  Continue PT.  Lovenox for DVT prophylaxis 40 mg SQ daily x 4 weeks.  Patient is WBAT on right lower extremity.  Follow up in the office in 10-14 days after discharge.    Thornton Park , MD 05/22/2018, 2:51 PM

## 2018-05-22 NOTE — Progress Notes (Addendum)
[place] [facility] [place] NAME: Alexandria Roman    MR#:  [id number]  DATE OF BIRTH:  [birth year]  SUBJECTIVE:  CHIEF COMPLAINT:   Chief Complaint  Patient presents with  . Fall  . Leg Pain   Better right hip pain, on oxygen by nasal cannula 2 L.  RN cannot wean off oxygen. REVIEW OF SYSTEMS:  Review of Systems  Constitutional: Negative for chills, fever and malaise/fatigue.  HENT: Negative for sore throat.   Eyes: Negative for blurred vision and double vision.  Respiratory: Positive for cough. Negative for hemoptysis, shortness of breath, wheezing and stridor.   Cardiovascular: Negative for chest pain, palpitations, orthopnea and leg swelling.  Gastrointestinal: Negative for abdominal pain, blood in stool, diarrhea, melena, nausea and vomiting.  Genitourinary: Negative for dysuria, flank pain and hematuria.  Musculoskeletal: Negative for back pain and joint pain.       Right hip pain.  Skin: Negative for rash.  Neurological: Negative for dizziness, sensory change, focal weakness, seizures, loss of consciousness, weakness and headaches.  Endo/Heme/Allergies: Negative for polydipsia.  Psychiatric/Behavioral: Negative for depression. The patient is not nervous/anxious.     DRUG ALLERGIES:   Allergies  Allergen Reactions  . Amoxicillin-Pot Clavulanate Diarrhea  . Cephalexin Other (See Comments) and Nausea And Vomiting  . Hydrochlorothiazide Other (See Comments) and Nausea And Vomiting  . Hydrocodone-Chlorpheniramine Other (See Comments)  . Hydrocodone-Homatropine Other (See Comments)  . Metronidazole Other (See Comments)    Other reaction(s): Other (See Comments) Other Reaction: Not Assessed Other Reaction: Not Assessed  . Moxifloxacin Other (See Comments)  . Prednisone Nausea Only  . Simvastatin Other (See Comments)  . Sulfa Antibiotics Other (See Comments)    Other reaction(s): Unknown  . Sulfasalazine Other (See Comments)   Other reaction(s): Unknown  . Tramadol Other (See Comments)  . Celecoxib Nausea And Vomiting  . Codeine Nausea Only    Other reaction(s): Other (See Comments) Other reaction(s): Unknown  . Cyclobenzaprine Other (See Comments) and Nausea Only    Other reaction(s): Other (See Comments)  . Oxycodone-Acetaminophen Nausea Only and Nausea And Vomiting  . Penicillin V Potassium Rash    Other reaction(s): Other (See Comments) Other reaction(s): Unknown   VITALS:  Blood pressure 130/78, pulse 99, temperature 98.5 F (36.9 C), temperature source Oral, resp. rate 17, height 5\' 4"  (1.626 m), weight 76.2 kg, SpO2 96 %. PHYSICAL EXAMINATION:  Physical Exam Constitutional:      General: She is not in acute distress.    Appearance: Normal appearance.  HENT:     Head: Normocephalic.     Mouth/Throat:     Mouth: Mucous membranes are moist.  Eyes:     General: No scleral icterus.    Conjunctiva/sclera: Conjunctivae normal.     Pupils: Pupils are equal, round, and reactive to light.  Neck:     Musculoskeletal: Normal range of motion and neck supple.     Vascular: No JVD.     Trachea: No tracheal deviation.  Cardiovascular:     Rate and Rhythm: Normal rate and regular rhythm.     Heart sounds: Normal heart sounds. No murmur. No gallop.   Pulmonary:     Effort: Pulmonary effort is normal. No respiratory distress.     Breath sounds: Normal breath sounds. No wheezing or rales.  Abdominal:     General: Bowel sounds are normal. There is no distension.     Palpations: Abdomen is soft.  Tenderness: There is no abdominal tenderness. There is no rebound.  Musculoskeletal:        General: No tenderness.     Right lower leg: No edema.     Left lower leg: No edema.     Comments: .  Skin:    Findings: No erythema or rash.  Neurological:     Mental Status: She is alert and oriented to person, place, and time.     Cranial Nerves: No cranial nerve deficit.  Psychiatric:        Mood and Affect:  Mood normal.    LABORATORY PANEL:  Female CBC Recent Labs  Lab 05/22/18 0437  WBC 10.9*  HGB 11.0*  HCT 34.2*  PLT 191   ------------------------------------------------------------------------------------------------------------------ Chemistries  Recent Labs  Lab 05/19/18 1931  05/22/18 0437  NA 137   < > 133*  K 3.3*   < > 4.4  CL 103   < > 101  CO2 22   < > 25  GLUCOSE 107*   < > 135*  BUN 15   < > 15  CREATININE 0.88   < > 0.82  CALCIUM 8.9   < > 8.1*  MG 2.0  --   --   AST 19  --   --   ALT 12  --   --   ALKPHOS 54  --   --   BILITOT 0.7  --   --    < > = values in this interval not displayed.   RADIOLOGY:  No results found. ASSESSMENT AND PLAN:  This is an 85 year old female admitted for hip fracture. 1.  Hip fracture: Right femoral neck;  Pain control, DVT prophylaxis with Lovenox and PT.  2.  Hypertension: Controlled; continue losartan.  Labetalol as needed prior to surgery. 3.  Hypothyroidism: elevated TSH; increased Synthroid dose. 4.  Hyperlipidemia: Continue statin therapy Hypokalemia.  Improved with potassium supplement.  Acute respiratory failure with hypoxia, possible related to anesthesia/surgery. DuoNeb as needed, Robitussin as needed. Try to wean off oxygen.  Chest x-ray: Interval development of bibasilar opacification likely infection. Mild cardiomegaly with suggestion of minimal vascular congestion.. Start Lasix for fluid overload.  Echocardiogram.  PT evaluation recommend SNF. All the records are reviewed and case discussed with Care Management/Social Worker. Management plans discussed with the patient, family and they are in agreement.  CODE STATUS: Full Code  TOTAL TIME TAKING CARE OF THIS PATIENT: 28 minutes.   More than 50% of the time was spent in counseling/coordination of care: YES  POSSIBLE D/C IN 2 DAYS, DEPENDING ON CLINICAL CONDITION.   Demetrios Loll M.D on 05/22/2018 at 2:55 PM  Between 7am to 6pm - Pager - [phone]   After 6pm go to [email or web address] - [facility]

## 2018-05-22 NOTE — Evaluation (Signed)
Occupational Therapy Evaluation Patient Details Name: Alexandria Roman MRN: [id number] DOB: 1929/07/08 Today's Date: 05/22/2018    History of Present Illness Patient is a pleasantly confused 85 year old female who presents to the Ed s/p fall resulting in R hip hemiarthroplasty. PMH includes HTN, endometrial cancer, mac. degeneration, myalgia, and recent vertigo. Patient has intermittent confusion with tangential nature making history challenging to obtain.    Clinical Impression   Alexandria Roman was seen for OT evaluation this date, POD#2 from above surgery. Pt was independent in all ADLs prior to surgery, however, had recently begun to use AE for mobility after experiencing episodes of vertigo in the home. Pt lives alone in a retirement community. She cares for her dog "Alexandria Roman", and has a significant other who lives one house down from her. Pt is eager to return to PLOF with less pain and improved safety and independence. Pt currently requires mod assist for LB dressing and bathing while in seated position due to pain and limited AROM of R hip. Pt declined OOB activity on this date. Per PT note pt was unsteady in standing, and required consistent support/encouragement. Pt continues to be pain limited and endorses pain in both her R Hip and R UE (primarily shoulder). Pt instructed in self care skills, falls prevention strategies, home/routines modifications, and pet mgt strategies. Pt would benefit from additional instruction in self care skills and techniques with or without assistive devices to support recall and carryover prior to discharge. Rec. STR upon hospital DC.      Follow Up Recommendations  SNF    Equipment Recommendations  Other (comment)(TBD)    Recommendations for Other Services       Precautions / Restrictions Precautions Precaution Comments: R hip hemiarthroplasty Restrictions Weight Bearing Restrictions: Yes Other Position/Activity Restrictions: WBAT      Mobility Bed  Mobility               General bed mobility comments: Deferred. Pt declined OOB/activity on this date. Endorsed 8/10 pain. RN informed. Per PT pt needs assistance for bed mobility min A for rolling, mod assist for sup<>sit. Unable to lift RLE.   Transfers Overall transfer level: Needs assistance               General transfer comment: Defererd at pt request. Per PT note pt min-mod a for STS from raised surface with max cueing for hand placement.     Balance Overall balance assessment: Needs assistance                                         ADL either performed or assessed with clinical judgement   ADL Overall ADL's : Needs assistance/impaired Eating/Feeding: Set up;Sitting Eating/Feeding Details (indicate cue type and reason): Pt had difficulty opening small containers on breakfast tray on this date. Decreased functional use of RUE. OT assisted with opening juice container/pouring water for pt from large room cup into smaller cup, as she has difficulty holding large cup due to decreased strength and pain at this time.  Grooming: Sitting;Set up   Upper Body Bathing: Sitting;Minimal assistance   Lower Body Bathing: Sitting/lateral leans;Cueing for safety;Moderate assistance   Upper Body Dressing : Minimal assistance;Sitting;Cueing for safety Upper Body Dressing Details (indicate cue type and reason): Pt may benefit from further education on compensatory dressing techniques for mgt of RUE.  Lower Body Dressing: Moderate assistance;Sit to/from stand;Cueing  for safety;With adaptive equipment   Toilet Transfer: Moderate assistance;Stand-pivot;BSC;Cueing for sequencing;Cueing for safety;RW   Toileting- Clothing Manipulation and Hygiene: Moderate assistance;Sit to/from stand;With adaptive equipment;Cueing for safety;Cueing for sequencing         General ADL Comments: Pt generally pain limited on this date. Declined OOB. Per PT note pt was very unsteady in  standing and became dizzy (however was not orthostatic).      Vision Baseline Vision/History: Wears glasses Wears Glasses: Reading only Patient Visual Report: No change from baseline;Other (comment)(Pt endorses recent episodes of vertigo and has experienced vertigo during admission. )       Perception     Praxis      Pertinent Vitals/Pain Pain Score: 8  Pain Location: R hip with movement; R shoulder with movement Pain Descriptors / Indicators: Grimacing;Guarding;Sharp Pain Intervention(s): Limited activity within patient's tolerance;Monitored during session;Patient requesting pain meds-RN notified     Hand Dominance Right   Extremity/Trunk Assessment Upper Extremity Assessment Upper Extremity Assessment: Generalized weakness;RUE deficits/detail RUE Deficits / Details: Pt endorsed pain with AROM R shoulder; Pt unable to flex shoulder beyond ~80. Complains of "lump" near biceps. Pt also endorses she is unable to use RUE functionally (e.g. can not hold water cup, use RUE to eat, etc.) RN notified.  RUE: Unable to fully assess due to pain   Lower Extremity Assessment Lower Extremity Assessment: RLE deficits/detail;Defer to PT evaluation RLE Deficits / Details: patient unable to perform full ROM due to surgery, pain, and confusion  RLE: Unable to fully assess due to pain       Communication Communication Communication: HOH;Other (comment)(Mildly HOH)   Cognition Arousal/Alertness: Awake/alert Behavior During Therapy: WFL for tasks assessed/performed;Anxious Overall Cognitive Status: No family/caregiver present to determine baseline cognitive functioning                                 General Comments: Pt noted to have intermittent confusion with tangential nature during prior PT session. Pt oriendted t/o OT evaluation, however, was noted to close eyes intermittently and occasionally lose track of conversation.   General Comments  Pt endorsing nasal and chest  congestion on this date. Stated she was unable to breathe through her nose. Pt had pushed Alexandria Roman to side while eating. RN notified pt experiencing congestion and not wearing [place] despite propmts to wear.     Exercises Other Exercises Other Exercises: Pt educated in falls prevention strategies including wearing appropriate foot wear, safe use of AE, and pet mgt strategies for improved safety upon hospital DC.    Shoulder Instructions      Home Living Family/patient expects to be discharged to:: Assisted living([facility] )                             Home Equipment: Gilford Rile - 2 wheels;Cane - single point;Shower seat;Grab bars - toilet;Grab bars - tub/shower   Additional Comments: Patient reports she has a walk in shower, grab bars in the bathroom, and house is accessible.       Prior Functioning/Environment Level of Independence: Independent with assistive device(s)        Comments: Patient reports she lives alone with her dog "Alexandria Roman" and has a significant other who lives one house down. Pt endorses 3 falls in the past year. Pt stated she was not using an AD up until her vertigo set in a few weeks  ago.         OT Problem List: Decreased strength;Impaired balance (sitting and/or standing);Decreased cognition;Decreased knowledge of precautions;Pain;Decreased range of motion;Decreased safety awareness;Decreased activity tolerance;Decreased coordination;Decreased knowledge of use of DME or AE;Impaired UE functional use;Impaired sensation      OT Treatment/Interventions: Self-care/ADL training;Manual therapy;Therapeutic exercise;Modalities;Patient/family education;Energy conservation;Therapeutic activities;DME and/or AE instruction;Cognitive remediation/compensation;Balance training    OT Goals(Current goals can be found in the care plan section) Acute Rehab OT Goals Patient Stated Goal: to return home to my dog OT Goal Formulation: With patient Time For Goal Achievement:  06/05/18 Potential to Achieve Goals: Good ADL Goals Pt Will Perform Eating: with set-up;with adaptive utensils;sitting(With LRAD as needed for safety and improved functional independence) Pt Will Perform Grooming: sitting;with modified independence;with adaptive equipment(With LRAD as needed for safety and improved functional independence) Pt Will Perform Lower Body Bathing: with adaptive equipment;with min guard assist;sit to/from stand(With LRAD as needed for safety and improved functional independence) Pt Will Perform Lower Body Dressing: with adaptive equipment;sit to/from stand;with min guard assist(With LRAD as needed for safety and improved functional independence) Additional ADL Goal #1: Pt will independently verbalize a plan to implement at least two falls prevention strategies into her daily routines for improved safety and return to meaningful occupations of daily life.  OT Frequency: Min 2X/week   Barriers to D/C: Decreased caregiver support          Co-evaluation              AM-PAC OT "6 Clicks" Daily Activity     Outcome Measure Help from another person eating meals?: A Little Help from another person taking care of personal grooming?: A Little Help from another person toileting, which includes using toliet, bedpan, or urinal?: A Lot Help from another person bathing (including washing, rinsing, drying)?: A Lot Help from another person to put on and taking off regular upper body clothing?: A Little Help from another person to put on and taking off regular lower body clothing?: A Lot 6 Click Score: 15   End of Session Nurse Communication: Patient requests pain meds;Other (comment)(Pt endorsing congestion; pt removed [place]. )  Activity Tolerance: Patient limited by pain Patient left: in bed;with call bell/phone within reach;with bed alarm set;with SCD's reapplied  OT Visit Diagnosis: Other abnormalities of gait and mobility (R26.89);History of falling (Z91.81);Muscle  weakness (generalized) (M62.81);Pain Pain - Right/Left: Right Pain - part of body: Hip;Shoulder                Time: 4259-5638 OT Time Calculation (min): 27 min Charges:  OT General Charges $OT Visit: 1 Visit OT Evaluation $OT Eval Low Complexity: 1 Low OT Treatments $Self Care/Home Management : 8-22 mins  Shara Blazing, M.S., OTR/L Ascom: [phone] 05/22/18, 10:28 AM

## 2018-05-22 NOTE — Progress Notes (Signed)
Pt alert but confused to time and situation. Pt can be easily reoriented. Minimal complaints of pain during the night. Pt able to sleep in between care. Not able to wean off of acute 2L of oxygen this shift. Voiding without difficulty.

## 2018-05-23 ENCOUNTER — Inpatient Hospital Stay: Admission: RE | Admit: 2018-05-23 | Payer: Medicare Other | Source: Ambulatory Visit

## 2018-05-23 ENCOUNTER — Other Ambulatory Visit: Payer: Self-pay

## 2018-05-23 LAB — CBC
HCT: 32.4 % — ABNORMAL LOW (ref 36.0–46.0)
Hemoglobin: 10.6 g/dL — ABNORMAL LOW (ref 12.0–15.0)
MCH: 29.2 pg (ref 26.0–34.0)
MCHC: 32.7 g/dL (ref 30.0–36.0)
MCV: 89.3 fL (ref 80.0–100.0)
Platelets: 232 10*3/uL (ref 150–400)
RBC: 3.63 MIL/uL — ABNORMAL LOW (ref 3.87–5.11)
RDW: 14.7 % (ref 11.5–15.5)
WBC: 10.3 10*3/uL (ref 4.0–10.5)
nRBC: 0 % (ref 0.0–0.2)

## 2018-05-23 LAB — SURGICAL PATHOLOGY

## 2018-05-23 MED ORDER — ENOXAPARIN SODIUM 40 MG/0.4ML ~~LOC~~ SOLN: 40.0000 mg | SUBCUTANEOUS | Status: DC

## 2018-05-23 MED ORDER — LEVOTHYROXINE SODIUM 100 MCG PO TABS: 100.0000 ug | ORAL_TABLET | Freq: Every day | ORAL | Status: DC

## 2018-05-23 MED ORDER — TRAMADOL HCL 50 MG PO TABS: 50.0000 mg | ORAL_TABLET | Freq: Four times a day (QID) | ORAL | 0 refills | Status: AC

## 2018-05-23 MED ORDER — BISACODYL 10 MG RE SUPP: 10.0000 mg | Freq: Every day | RECTAL | 0 refills | Status: DC | PRN

## 2018-05-23 MED ORDER — VITAMIN D3 25 MCG (1000 UT) PO CAPS: 1.0000 | ORAL_CAPSULE | Freq: Every day | ORAL | Status: DC

## 2018-05-23 MED ORDER — LOSARTAN POTASSIUM 25 MG PO TABS: 25.0000 mg | ORAL_TABLET | Freq: Every day | ORAL | Status: DC

## 2018-05-23 NOTE — TOC Transition Note (Signed)
Transition of Care [facility]) - CM/SW Discharge Note   Patient Details  Name: Alexandria Roman MRN: [id number] Date of Birth: May 28, 1929  Transition of Care [facility]) CM/SW Contact:  Su Hilt, RN Phone Number: 05/23/2018, 10:01 AM   Clinical Narrative:     Patient to DC to [facility] to room 779, Med Nec for and face sheet placed in the chart in DC packet, Bedside nurse to call report to [phone], EMS to be called by bedside nurse.  Patient reminded that she will be going to [facility] today and she agreed.    Final next level of care: Skilled Nursing Facility Barriers to Discharge: Barriers Resolved   Patient Goals and CMS Choice Patient states their goals for this hospitalization and ongoing recovery are:: "I want to get home and be able to take care of my dog." CMS Medicare.gov Compare Post Acute Care list provided to:: Patient Choice offered to / list presented to : Patient  Discharge Placement                       Discharge Plan and Services In-house Referral: NA Discharge Planning Services: NA Post Acute Care Choice: Nursing Home, Home Health          DME Arranged: N/A DME Agency: [facility] HH Arranged: NA HH Agency: NA   Social Determinants of Health (SDOH) Interventions     Readmission Risk Interventions No flowsheet data found.

## 2018-05-23 NOTE — Discharge Summary (Signed)
[facility] [place] NAME: Alexandria Roman    MR#:  [id number]  DATE OF BIRTH:  1929/05/18  DATE OF ADMISSION:  05/19/2018   ADMITTING PHYSICIAN: Harrie Foreman, MD  DATE OF DISCHARGE: 05/23/2018  PRIMARY CARE PHYSICIAN: Glendon Axe, MD   ADMISSION DIAGNOSIS:  Closed fracture of neck of right femur, initial encounter ([place]) [S72.001A] DISCHARGE DIAGNOSIS:  Active Problems:   Hip fracture ([place])  SECONDARY DIAGNOSIS:   Past Medical History:  Diagnosis Date  . Cancer ([place])   . Hypertension    HOSPITAL COURSE:  This is an 85 year old female admitted for hip fracture. 1. Hip fracture: Right femoral neck;  Pain control, DVT prophylaxis with Lovenox for 4 weeks and PT.  2. Hypertension: Controlled; continue losartan. Labetalol as needed prior to surgery. 3. Hypothyroidism: elevated TSH; increased Synthroid dose. 4. Hyperlipidemia: Continue statin therapy Hypokalemia.  Improved with potassium supplement.  Acute respiratory failure with hypoxia, possible related to anesthesia/surgery. DuoNeb as needed, Robitussin as needed. Try to wean off oxygen.  Chest x-ray: Interval development of bibasilar opacification likely infection. Mild cardiomegaly with suggestion of minimal vascular congestion.. Started Lasix for fluid overload.  Echocardiogram is pending. Hold lasix on after discharge due to soft BP.  PT evaluation recommend SNF. Patient is living alone at home. DISCHARGE CONDITIONS:  Stable, discharge to SNF today. CONSULTS OBTAINED:   DRUG ALLERGIES:   Allergies  Allergen Reactions  . Amoxicillin-Pot Clavulanate Diarrhea  . Cephalexin Other (See Comments) and Nausea And Vomiting  . Hydrochlorothiazide Other (See Comments) and Nausea And Vomiting  . Hydrocodone-Chlorpheniramine Other (See Comments)  . Hydrocodone-Homatropine Other (See Comments)  . Metronidazole Other (See Comments)    Other reaction(s): Other (See  Comments) Other Reaction: Not Assessed Other Reaction: Not Assessed  . Moxifloxacin Other (See Comments)  . Prednisone Nausea Only  . Simvastatin Other (See Comments)  . Sulfa Antibiotics Other (See Comments)    Other reaction(s): Unknown  . Sulfasalazine Other (See Comments)    Other reaction(s): Unknown  . Tramadol Other (See Comments)  . Celecoxib Nausea And Vomiting  . Codeine Nausea Only    Other reaction(s): Other (See Comments) Other reaction(s): Unknown  . Cyclobenzaprine Other (See Comments) and Nausea Only    Other reaction(s): Other (See Comments)  . Oxycodone-Acetaminophen Nausea Only and Nausea And Vomiting  . Penicillin V Potassium Rash    Other reaction(s): Other (See Comments) Other reaction(s): Unknown   DISCHARGE MEDICATIONS:   Allergies as of 05/23/2018      Reactions   Amoxicillin-pot Clavulanate Diarrhea   Cephalexin Other (See Comments), Nausea And Vomiting   Hydrochlorothiazide Other (See Comments), Nausea And Vomiting   Hydrocodone-chlorpheniramine Other (See Comments)   Hydrocodone-homatropine Other (See Comments)   Metronidazole Other (See Comments)   Other reaction(s): Other (See Comments) Other Reaction: Not Assessed Other Reaction: Not Assessed   Moxifloxacin Other (See Comments)   Prednisone Nausea Only   Simvastatin Other (See Comments)   Sulfa Antibiotics Other (See Comments)   Other reaction(s): Unknown   Sulfasalazine Other (See Comments)   Other reaction(s): Unknown   Tramadol Other (See Comments)   Celecoxib Nausea And Vomiting   Codeine Nausea Only   Other reaction(s): Other (See Comments) Other reaction(s): Unknown   Cyclobenzaprine Other (See Comments), Nausea Only   Other reaction(s): Other (See Comments)   Oxycodone-acetaminophen Nausea Only, Nausea And Vomiting   Penicillin V Potassium Rash   Other reaction(s): Other (See Comments) Other reaction(s): Unknown  Medication List    TAKE these medications   bisacodyl 10  MG suppository Commonly known as:  DULCOLAX Place 1 suppository (10 mg total) rectally daily as needed for moderate constipation.   docusate sodium 100 MG capsule Commonly known as:  COLACE Take 100 mg by mouth as needed for constipation.   DULoxetine 30 MG capsule Commonly known as:  CYMBALTA Take 1 capsule by mouth 2 (two) times daily.   enoxaparin 40 MG/0.4ML injection Commonly known as:  LOVENOX Inject 0.4 mLs (40 mg total) into the skin daily for 28 days. Start taking on:  May 24, 2018   fluticasone 50 MCG/ACT nasal spray Commonly known as:  FLONASE Place 1 spray into both nostrils as needed.   gabapentin 300 MG capsule Commonly known as:  NEURONTIN Take 2 capsules (600 mg total) by mouth 2 (two) times daily.   levothyroxine 100 MCG tablet Commonly known as:  SYNTHROID, LEVOTHROID Take 1 tablet (100 mcg total) by mouth daily before breakfast. Start taking on:  May 24, 2018 What changed:    medication strength  how much to take   LORazepam 0.5 MG tablet Commonly known as:  ATIVAN Take 1 tablet (0.5 mg total) by mouth 2 (two) times daily.   losartan 25 MG tablet Commonly known as:  COZAAR Take 1 tablet (25 mg total) by mouth daily.   meclizine 25 MG tablet Commonly known as:  ANTIVERT Take 1 tablet (25 mg total) by mouth 3 (three) times daily as needed for dizziness.   olopatadine 0.1 % ophthalmic solution Commonly known as:  PATANOL Place 1 drop into both eyes 2 (two) times daily.   PreserVision/Lutein Caps Take 1 capsule by mouth 2 (two) times daily.   traMADol 50 MG tablet Commonly known as:  ULTRAM Take 1 tablet (50 mg total) by mouth every 6 (six) hours for 3 days.   Vitamin D3 25 MCG (1000 UT) Caps Take 1 capsule (1,000 Units total) by mouth daily.   Zofran 4 MG tablet Generic drug:  ondansetron Take 4 mg by mouth as needed for nausea.        DISCHARGE INSTRUCTIONS:  See AVS.  If you experience worsening of your admission symptoms,  develop shortness of breath, life threatening emergency, suicidal or homicidal thoughts you must seek medical attention immediately by calling 911 or calling your MD immediately  if symptoms less severe.  You Must read complete instructions/literature along with all the possible adverse reactions/side effects for all the Medicines you take and that have been prescribed to you. Take any new Medicines after you have completely understood and accpet all the possible adverse reactions/side effects.   Please note  You were cared for by a hospitalist during your hospital stay. If you have any questions about your discharge medications or the care you received while you were in the hospital after you are discharged, you can call the unit and asked to speak with the hospitalist on call if the hospitalist that took care of you is not available. Once you are discharged, your primary care physician will handle any further medical issues. Please note that NO REFILLS for any discharge medications will be authorized once you are discharged, as it is imperative that you return to your primary care physician (or establish a relationship with a primary care physician if you do not have one) for your aftercare needs so that they can reassess your need for medications and monitor your lab values.    On the day  of Discharge:  VITAL SIGNS:  Blood pressure 118/64, pulse 97, temperature 98.7 F (37.1 C), temperature source Oral, resp. rate 18, height 5\' 4"  (1.626 m), weight 80.3 kg, SpO2 96 %. PHYSICAL EXAMINATION:  GENERAL:  85 y.o.-year-old patient lying in the bed with no acute distress.  EYES: Pupils equal, round, reactive to light and accommodation. No scleral icterus. Extraocular muscles intact.  HEENT: Head atraumatic, normocephalic. Oropharynx and nasopharynx clear.  NECK:  Supple, no jugular venous distention. No thyroid enlargement, no tenderness.  LUNGS: Normal breath sounds bilaterally, no wheezing,  rales,rhonchi or crepitation. No use of accessory muscles of respiration.  CARDIOVASCULAR: S1, S2 normal. No murmurs, rubs, or gallops.  ABDOMEN: Soft, non-tender, non-distended. Bowel sounds present. No organomegaly or mass.  EXTREMITIES: No pedal edema, cyanosis, or clubbing.  NEUROLOGIC: Cranial nerves II through XII are intact. Muscle strength 4/5 in all extremities. Sensation intact. Gait not checked.  PSYCHIATRIC: The patient is alert and oriented x 3.  SKIN: No obvious rash, lesion, or ulcer.  DATA REVIEW:   CBC Recent Labs  Lab 05/23/18 0542  WBC 10.3  HGB 10.6*  HCT 32.4*  PLT 232    Chemistries  Recent Labs  Lab 05/19/18 1931  05/22/18 0437  NA 137   < > 133*  K 3.3*   < > 4.4  CL 103   < > 101  CO2 22   < > 25  GLUCOSE 107*   < > 135*  BUN 15   < > 15  CREATININE 0.88   < > 0.82  CALCIUM 8.9   < > 8.1*  MG 2.0  --   --   AST 19  --   --   ALT 12  --   --   ALKPHOS 54  --   --   BILITOT 0.7  --   --    < > = values in this interval not displayed.     Microbiology Results  Results for orders placed or performed during the hospital encounter of 05/19/18  Surgical PCR screen     Status: None   Collection Time: 05/19/18 11:02 PM  Result Value Ref Range Status   MRSA, PCR NEGATIVE NEGATIVE Final   Staphylococcus aureus NEGATIVE NEGATIVE Final    Comment: (NOTE) The Xpert SA Assay (FDA approved for NASAL specimens in patients 4 years of age and older), is one component of a comprehensive surveillance program. It is not intended to diagnose infection nor to guide or monitor treatment. Performed at [facility], [address], [place], [place] [address]     RADIOLOGY:  Dg Chest Port 1 View  Result Date: 05/22/2018 CLINICAL DATA:  Increasing shortness of breath. EXAM: PORTABLE CHEST 1 VIEW COMPARISON:  05/19/2018 FINDINGS: Lungs are adequately inflated demonstrate interval worsening bibasilar opacification with mild prominence of the perihilar  markings. No effusion. Stable cardiomegaly. Remainder of the exam is unchanged. IMPRESSION: Interval development of bibasilar opacification likely infection. Mild cardiomegaly with suggestion of minimal vascular congestion. Electronically Signed   By: Marin Olp M.D.   On: 05/22/2018 15:33     Management plans discussed with the patient, family and they are in agreement.  CODE STATUS: Full Code   TOTAL TIME TAKING CARE OF THIS PATIENT: 35 minutes.    Demetrios Loll M.D on 05/23/2018 at 9:49 AM  Between 7am to 6pm - Pager - [phone]  After 6pm go to [email or web address] - [facility]  Office  [phone]  CC: Primary care physician; Glendon Axe, MD   Note: This dictation was prepared with Dragon dictation along with smaller phrase technology. Any transcriptional errors that result from this process are unintentional.

## 2018-05-23 NOTE — Progress Notes (Signed)
Physical Therapy Treatment Patient Details Name: Alexandria Roman MRN: [id number] DOB: Oct 14, 1929 Today's Date: 05/23/2018    History of Present Illness Patient is a pleasantly confused 85 year old female who presents to the Ed s/p fall resulting in R hip hemiarthroplasty. PMH includes HTN, endometrial cancer, mac. degeneration, myalgia, and recent vertigo. Patient has intermittent confusion with tangential nature making history challenging to obtain.     PT Comments    Pt initially resistant to doing a lot with PT, but ultimately agreed to work with PT and did better than she initially let on that she would.  Pt able to do most of her exercises with some AROM (light resistance with some, AAROM with others) but once started actually participated well.  Pt very much struggled with mobility and especially standing tasks but was able to do so with assist and plenty of encouragement.   Follow Up Recommendations  SNF     Equipment Recommendations       Recommendations for Other Services       Precautions / Restrictions Precautions Precautions: Posterior Hip Restrictions RLE Weight Bearing: Weight bearing as tolerated    Mobility  Bed Mobility Overal bed mobility: Needs Assistance Bed Mobility: Supine to Sit;Sit to Supine     Supine to sit: Mod assist Sit to supine: Mod assist   General bed mobility comments: Pt with some effort in getting to/from sitting, but ultimately needed considerable assist to change positions in bed  Transfers Overall transfer level: Needs assistance Equipment used: Rolling walker (2 wheeled) Transfers: Sit to/from Stand Sit to Stand: Mod assist         General transfer comment: Pt was able to give some assist in getting to standing from slightly raised bed, lacked confidence and needed at least min assist to maintain upright  Ambulation/Gait             General Gait Details: attempted to few side steps along EOB, but unable to meaningfully  unweight w/o heavy assist   Stairs             Wheelchair Mobility    Modified Rankin (Stroke Patients Only)       Balance Overall balance assessment: Modified Independent Sitting-balance support: Bilateral upper extremity supported Sitting balance-Leahy Scale: Fair     Standing balance support: Bilateral upper extremity supported Standing balance-Leahy Scale: Poor Standing balance comment: Pt lacking confidence in standing, leaning back and unable to fully follow instructions to use walker appropriately                            Cognition Arousal/Alertness: Lethargic Behavior During Therapy: Anxious Overall Cognitive Status: Difficult to assess                                 General Comments: Pt appears pleasantly confused, did fall asleep multiple times t/o the session      Exercises General Exercises - Lower Extremity Ankle Circles/Pumps: AROM;10 reps Quad Sets: Strengthening;10 reps Short Arc Quad: AROM;Strengthening;15 reps Heel Slides: AROM;10 reps(with resisted leg ext) Hip ABduction/ADduction: Strengthening;20 reps    General Comments        Pertinent Vitals/Pain Pain Assessment: 0-10 Pain Score: 2 (minimal at rest, increases with all R LE activity)    Home Living  Prior Function            PT Goals (current goals can now be found in the care plan section) Progress towards PT goals: Progressing toward goals    Frequency    BID      PT Plan Current plan remains appropriate    Co-evaluation              AM-PAC PT "6 Clicks" Mobility   Outcome Measure  Help needed turning from your back to your side while in a flat bed without using bedrails?: A Lot Help needed moving from lying on your back to sitting on the side of a flat bed without using bedrails?: A Lot Help needed moving to and from a bed to a chair (including a wheelchair)?: A Lot Help needed standing up from a  chair using your arms (e.g., wheelchair or bedside chair)?: A Lot Help needed to walk in hospital room?: A Lot Help needed climbing 3-5 steps with a railing? : Total 6 Click Score: 11    End of Session Equipment Utilized During Treatment: Gait belt;Oxygen Activity Tolerance: Patient limited by pain Patient left: with call bell/phone within reach;with SCD's reapplied;with bed alarm set;with nursing/sitter in room   PT Visit Diagnosis: Unsteadiness on feet (R26.81);Other abnormalities of gait and mobility (R26.89);Repeated falls (R29.6);Muscle weakness (generalized) (M62.81);Pain;Dizziness and giddiness (R42) Pain - Right/Left: Right Pain - part of body: Hip     Time: 8592-9244 PT Time Calculation (min) (ACUTE ONLY): 27 min  Charges:  $Therapeutic Exercise: 8-22 mins $Therapeutic Activity: 8-22 mins                     Kreg Shropshire, DPT 05/23/2018, 1:22 PM

## 2018-05-23 NOTE — Progress Notes (Signed)
Report called and given to Kim at [facility]. EMS called for transport. IVs removed. Pt dressed and Awaiting transport

## 2019-05-13 ENCOUNTER — Ambulatory Visit
Admission: EM | Admit: 2019-05-13 | Discharge: 2019-05-13 | Disposition: A | Discharge status: Home / Self Care | Payer: Medicare PPO | Attending: Family Medicine | Admitting: Family Medicine

## 2019-05-13 ENCOUNTER — Other Ambulatory Visit: Payer: Self-pay

## 2019-05-13 ENCOUNTER — Ambulatory Visit (INDEPENDENT_AMBULATORY_CARE_PROVIDER_SITE_OTHER): Payer: Medicare PPO

## 2019-05-13 DIAGNOSIS — J90 Pleural effusion, not elsewhere classified: Secondary | ICD-10-CM

## 2019-05-13 DIAGNOSIS — R05 Cough: Secondary | ICD-10-CM

## 2019-05-13 DIAGNOSIS — R059 Cough, unspecified: Secondary | ICD-10-CM

## 2019-05-13 MED ORDER — IPRATROPIUM BROMIDE 0.06 % NA SOLN: 2.0000 | Freq: Four times a day (QID) | NASAL | 0 refills | Status: DC | PRN

## 2019-05-13 MED ORDER — DOXYCYCLINE HYCLATE 100 MG PO CAPS: 100.0000 mg | ORAL_CAPSULE | Freq: Two times a day (BID) | ORAL | 0 refills | Status: DC

## 2019-05-13 MED ORDER — CEFDINIR 300 MG PO CAPS: 300.0000 mg | ORAL_CAPSULE | Freq: Two times a day (BID) | ORAL | 0 refills | Status: AC

## 2019-05-13 NOTE — ED Triage Notes (Signed)
Pt presents with c/o productive cough for over one week. Pt also has sinus congestion and shob. Pt has had both COVID vaccines. Pt was seen by PCP for these symptoms 05/03/19 and was given inhaler (combivent) and z-pak. Pt did finish abx and reports difficulty using inhaler.

## 2019-05-13 NOTE — Discharge Instructions (Addendum)
Antibiotics as prescribed.  You need to see Dr. Caryl Comes for follow up. You may need thoracentesis.  Take care  Dr. Lacinda Axon

## 2019-05-13 NOTE — ED Provider Notes (Signed)
[facility]    CSN: [id number] Arrival date & time: 05/13/19  1155      History   Chief Complaint Chief Complaint  Patient presents with   Cough   HPI  86 year old female presents with runny nose, cough, shortness of breath.  Patient reports that she has been sick for over 1 week.  She saw a physician in her primary care physician's office on 3/18.  Office note reviewed.  Patient diagnosed with rhinitis and laryngitis.  Advised to use Flonase and Afrin.  Started Allegra and Combivent.  Chest x-ray was ordered.  Patient placed on Z-Pak.  Chest x-ray has not been done.  She reports continued symptoms despite completion of antibiotic therapy.  She reports continued rhinorrhea, congestion, and shortness of breath as well as cough.  No fever.  No relieving factors.  Patient states that she does not know how to use the inhaler that she was given.  Additionally, patient is upset today.  She states that her significant other passed away today.  She is very upset about this.  No other complaints or concerns at this time.  Patient Active Problem List   Diagnosis Date Noted   Hip fracture ([place]) 05/19/2018   Depression with anxiety 12/09/2016   Numbness of foot 12/09/2016   Macular degeneration 12/09/2016   Gait instability 12/08/2016   Upper respiratory infection, viral 12/06/2016   Primary osteoarthritis of both knees 07/20/2016   Postmenopausal osteoporosis 04/15/2016   Chest pain 03/19/2016   Spinal stenosis 08/04/2015   Myalgia 06/11/2015   Polyarthralgia 06/11/2015   Chronic midline low back pain with bilateral sciatica 01/10/2015   Benign essential hypertension 06/04/2014   CKD (chronic kidney disease) stage 3, GFR 30-59 ml/min 06/04/2014   Mixed hyperlipidemia 06/04/2014   Other allergic rhinitis 06/04/2014   DDD (degenerative disc disease), lumbar 09/07/2013   Lumbar radiculitis 09/07/2013   Hypothyroidism, unspecified 08/02/2013   Surgical  Hx: MASTECTOMY PARTIAL / LUMPECTOMY  Left    HYSTERECTOMY      TONSILLECTOMY      DILATION AND CURETTAGE, DIAGNOSTIC / THERAPEUTIC      CATARACT EXTRACTION   Dr. Wallace Going   KNEE ARTHROSCOPY 02/16/2007 - 02/15/2008 Left per Dr. Marry Guan   JOINT REPLACEMENT 08/11/2009-right/12/04/2007 left Bilateral     OB History   No obstetric history on file.      Home Medications    Prior to Admission medications   Medication Sig Start Date End Date Taking? Authorizing Provider  bisacodyl (DULCOLAX) 10 MG suppository Place 1 suppository (10 mg total) rectally daily as needed for moderate constipation. 05/23/18  Yes Demetrios Loll, MD  Cholecalciferol (VITAMIN D3) 25 MCG (1000 UT) CAPS Take 1 capsule (1,000 Units total) by mouth daily. 05/23/18  Yes Demetrios Loll, MD  COMBIVENT RESPIMAT 20-100 MCG/ACT AERS respimat Inhale 2 puffs into the lungs in the morning, at noon, and at bedtime. 05/03/19  Yes [provider]  docusate sodium (COLACE) 100 MG capsule Take 100 mg by mouth as needed for constipation.   Yes [provider]  DULoxetine (CYMBALTA) 30 MG capsule Take 1 capsule by mouth 2 (two) times daily. 11/03/16  Yes [provider]  fluticasone (FLONASE) 50 MCG/ACT nasal spray Place 1 spray into both nostrils as needed. 11/02/16  Yes [provider]  gabapentin (NEURONTIN) 300 MG capsule Take 2 capsules (600 mg total) by mouth 2 (two) times daily. 12/09/16  Yes Plonk, Gwyndolyn Saxon, MD  levothyroxine (SYNTHROID, LEVOTHROID) 100 MCG tablet Take 1 tablet (  100 mcg total) by mouth daily before breakfast. 05/24/18  Yes Demetrios Loll, MD  LORazepam (ATIVAN) 0.5 MG tablet Take 1 tablet (0.5 mg total) by mouth 2 (two) times daily. 03/20/16  Yes Hillary Bow, MD  meclizine (ANTIVERT) 25 MG tablet Take 1 tablet (25 mg total) by mouth 3 (three) times daily as needed for dizziness. 04/27/18  Yes Arta Silence, MD  metoprolol succinate (TOPROL-XL) 25 MG 24 hr tablet Take 1 tablet by  mouth daily. 03/21/19 03/20/20 Yes [provider]  Multiple Vitamins-Minerals (PRESERVISION/LUTEIN) CAPS Take 1 capsule by mouth 2 (two) times daily.   Yes [provider]  cefdinir (OMNICEF) 300 MG capsule Take 1 capsule (300 mg total) by mouth 2 (two) times daily for 7 days. 05/13/19 05/20/19  Coral Spikes, DO  doxycycline (VIBRAMYCIN) 100 MG capsule Take 1 capsule (100 mg total) by mouth 2 (two) times daily. 05/13/19   Coral Spikes, DO  ipratropium (ATROVENT) 0.06 % nasal spray Place 2 sprays into both nostrils 4 (four) times daily as needed for rhinitis. 05/13/19   Coral Spikes, DO  enoxaparin (LOVENOX) 40 MG/0.4ML injection Inject 0.4 mLs (40 mg total) into the skin daily for 28 days. 05/24/18 05/13/19  Demetrios Loll, MD  losartan (COZAAR) 25 MG tablet Take 1 tablet (25 mg total) by mouth daily. 05/23/18 05/13/19  Demetrios Loll, MD    Family History Family History  Problem Relation Age of Onset   Testicular cancer Father     Social History Social History   Tobacco Use   Smoking status: Never Smoker   Smokeless tobacco: Never Used  Substance Use Topics   Alcohol use: No   Drug use: No     Allergies   Amoxicillin-pot clavulanate, Cephalexin, Hydrochlorothiazide, Hydrocodone-chlorpheniramine, Hydrocodone-homatropine, Metronidazole, Moxifloxacin, Prednisone, Simvastatin, Sulfa antibiotics, Sulfasalazine, Tramadol, Celecoxib, Codeine, Cyclobenzaprine, Oxycodone-acetaminophen, and Penicillin v potassium   Review of Systems Review of Systems  Constitutional: Negative for fever.  HENT: Positive for rhinorrhea.   Respiratory: Positive for cough and shortness of breath.    Physical Exam Triage Vital Signs ED Triage Vitals  Enc Vitals Group     BP 05/13/19 1217 118/77     Pulse Rate 05/13/19 1217 (!) 110     Resp 05/13/19 1217 18     Temp 05/13/19 1217 98.5 F (36.9 C)     Temp Source 05/13/19 1217 Oral     SpO2 05/13/19 1217 97 %     Weight 05/13/19 1208 170 lb (77.1  kg)     Height 05/13/19 1208 5\' 4"  (1.626 m)     Head Circumference --      Peak Flow --      Pain Score 05/13/19 1208 0     Pain Loc --      Pain Edu? --      Excl. in [place]? --    Updated Vital Signs BP 118/77 (BP Location: Left Arm)    Pulse (!) 110    Temp 98.5 F (36.9 C) (Oral)    Resp 18    Ht 5\' 4"  (1.626 m)    Wt 77.1 kg    SpO2 97%    BMI 29.18 kg/m   Visual Acuity Right Eye Distance:   Left Eye Distance:   Bilateral Distance:    Right Eye Near:   Left Eye Near:    Bilateral Near:     Physical Exam Vitals and nursing note reviewed.  Constitutional:      General: She is  not in acute distress.    Appearance: Normal appearance. She is not ill-appearing.  HENT:     Head: Normocephalic and atraumatic.  Eyes:     General:        Right eye: No discharge.        Left eye: No discharge.     Conjunctiva/sclera: Conjunctivae normal.  Cardiovascular:     Rate and Rhythm: Regular rhythm. Tachycardia present.  Pulmonary:     Effort: Pulmonary effort is normal.     Comments: Left basilar crackles. Neurological:     Mental Status: She is alert.  Psychiatric:        Mood and Affect: Mood normal.        Behavior: Behavior normal.    UC Treatments / Results  Labs (all labs ordered are listed, but only abnormal results are displayed) Labs Reviewed - No data to display  EKG   Radiology DG Chest 2 View  Result Date: 05/13/2019 CLINICAL DATA:  Cough and shortness of breath for 1 week EXAM: CHEST - 2 VIEW COMPARISON:  May 22, 2018 FINDINGS: Mediastinal contour and cardiac silhouette are stable. There is a large hiatal hernia. Minimal left pleural effusion is identified. There is no pulmonary edema or focal pneumonia. Degenerative joint changes of the spine are identified. Vertebroplasty of a lower thoracic upper lumbar vertebral body is identified. IMPRESSION: 1. Minimal left pleural effusion. No focal pneumonia. 2. Large hiatal hernia. Electronically Signed   By: Abelardo Diesel M.D.   On: 05/13/2019 13:08    Procedures Procedures (including critical care time)  Medications Ordered in UC Medications - No data to display  Initial Impression / Assessment and Plan / UC Course  I have reviewed the triage vital signs and the nursing notes.  Pertinent labs & imaging results that were available during my care of the patient were reviewed by me and considered in my medical decision making (see chart for details).    86 year old female presents with rhinorrhea, cough, and shortness of breath.  Patient coughing throughout history.  Chest x-ray was ordered by her primary care physician's office but was not done.  Chest x-ray obtained today and revealed left pleural effusion.  The etiology and prognosis is uncertain at this time.  Placing patient empirically on antibiotic therapy to cover for infectious etiology.  I have sent a note to her primary care physician regarding follow-up.  Will likely need thoracentesis unless this resolves with antibiotic therapy.  Also placed the patient on Atrovent nasal spray for rhinorrhea.  Final Clinical Impressions(s) / UC Diagnoses   Final diagnoses:  Pleural effusion  Cough     Discharge Instructions     Antibiotics as prescribed.  You need to see Dr. Caryl Comes for follow up. You may need thoracentesis.  Take care  Dr. Lacinda Axon    ED Prescriptions    Medication Sig Dispense Auth. Provider   ipratropium (ATROVENT) 0.06 % nasal spray Place 2 sprays into both nostrils 4 (four) times daily as needed for rhinitis. 15 mL Domonique Brouillard G, DO   cefdinir (OMNICEF) 300 MG capsule Take 1 capsule (300 mg total) by mouth 2 (two) times daily for 7 days. 14 capsule Marco Adelson G, DO   doxycycline (VIBRAMYCIN) 100 MG capsule Take 1 capsule (100 mg total) by mouth 2 (two) times daily. 14 capsule Thersa Salt G, DO     PDMP not reviewed this encounter.   Coral Spikes, [place] 05/13/19 1348

## 2019-10-19 IMAGING — CR CHEST  1 VIEW
1 series · 1 of 1 positions shown · non-contrast
Comparison: March 19, 2016

CLINICAL DATA: Fall earlier this evening.

EXAM:
CHEST  1 VIEW

[chest ap]
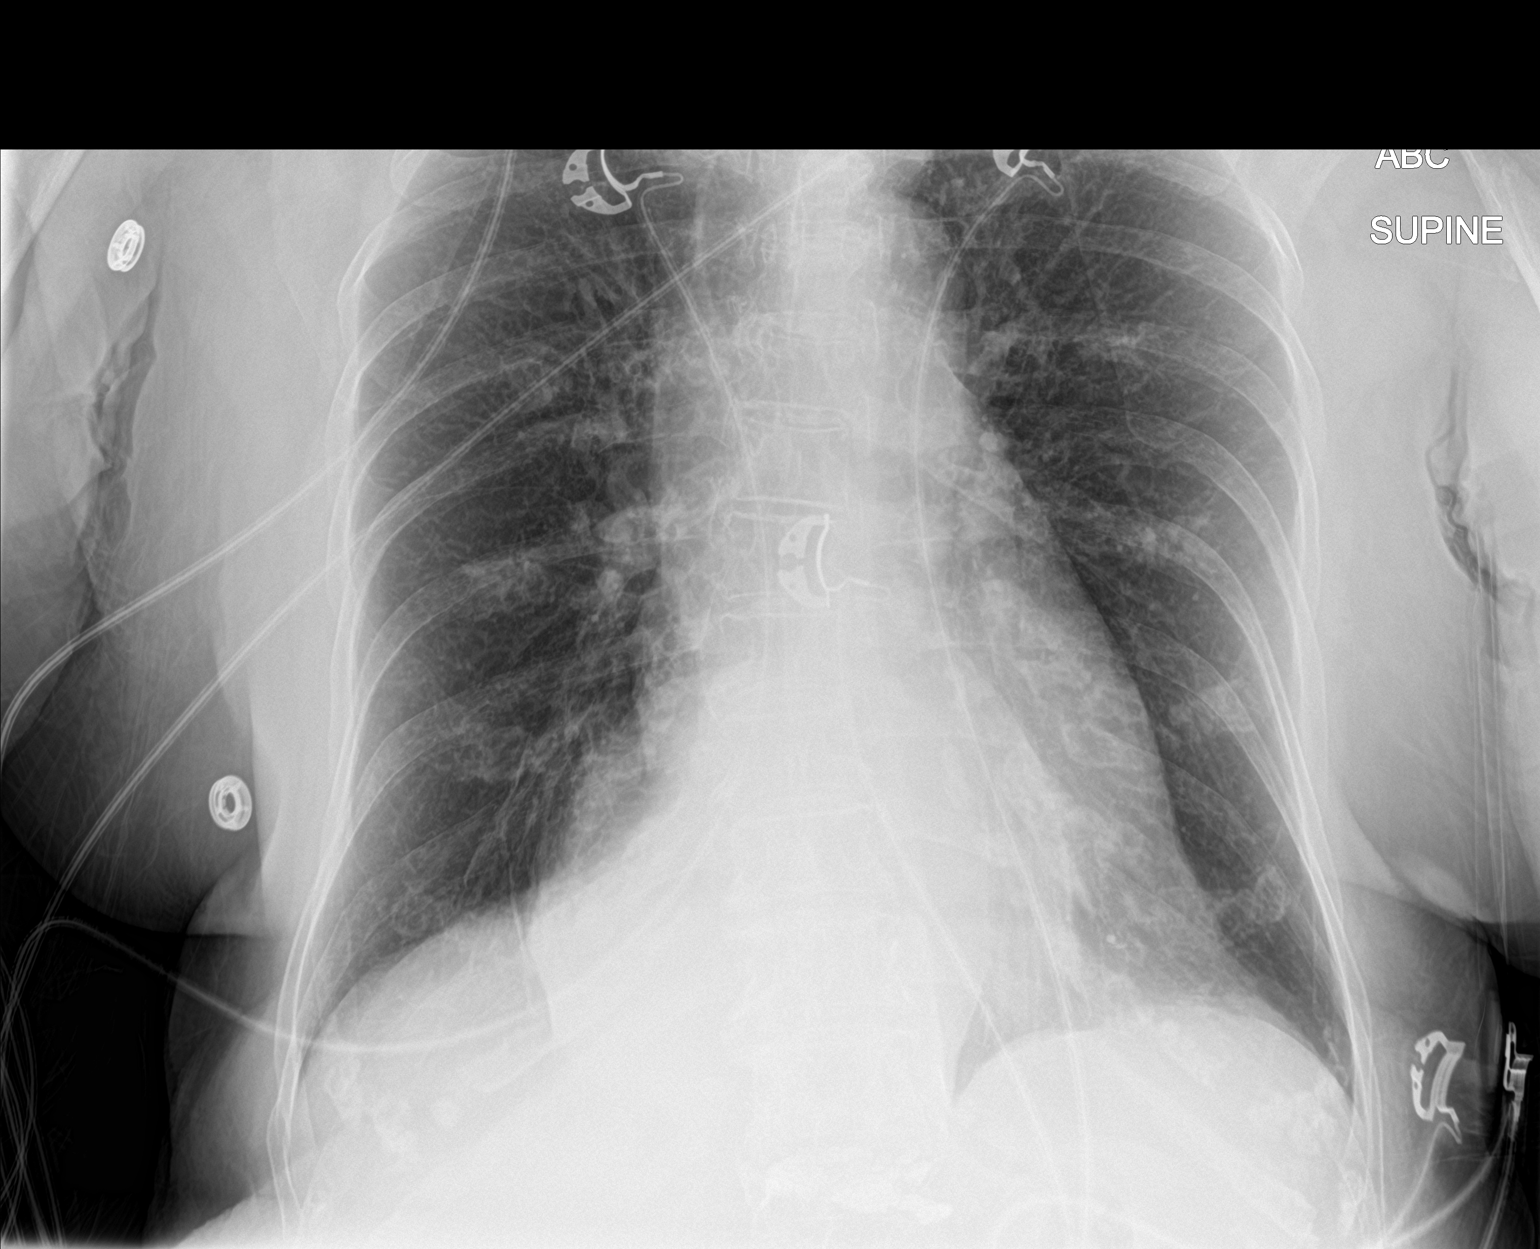

[1 of 1 positions shown; findings below may reference images not displayed]

FINDINGS: A hiatal hernia is identified. The cardiomediastinal silhouette is
stable. No pneumothorax. The lungs are clear.
IMPRESSION: No active disease.

## 2020-01-28 ENCOUNTER — Encounter: Payer: Self-pay | Admitting: Emergency Medicine

## 2020-01-28 ENCOUNTER — Other Ambulatory Visit: Payer: Self-pay

## 2020-01-28 ENCOUNTER — Ambulatory Visit
Admission: EM | Admit: 2020-01-28 | Discharge: 2020-01-28 | Disposition: A | Discharge status: Home / Self Care | Payer: Medicare PPO | Attending: Family Medicine | Admitting: Family Medicine

## 2020-01-28 DIAGNOSIS — R42 Dizziness and giddiness: Secondary | ICD-10-CM | POA: Insufficient documentation

## 2020-01-28 LAB — COMPREHENSIVE METABOLIC PANEL
ALT: 11 U/L (ref 0–44)
AST: 15 U/L (ref 15–41)
Albumin: 4 g/dL (ref 3.5–5.0)
Alkaline Phosphatase: 54 U/L (ref 38–126)
Anion gap: 15 (ref 5–15)
BUN: 19 mg/dL (ref 8–23)
CO2: 27 mmol/L (ref 22–32)
Calcium: 9.1 mg/dL (ref 8.9–10.3)
Chloride: 95 mmol/L — ABNORMAL LOW (ref 98–111)
Creatinine, Ser: 1.17 mg/dL — ABNORMAL HIGH (ref 0.44–1.00)
GFR, Estimated: 45 mL/min — ABNORMAL LOW (ref >60)
Glucose, Bld: 100 mg/dL — ABNORMAL HIGH (ref 70–99)
Potassium: 4.2 mmol/L (ref 3.5–5.1)
Sodium: 137 mmol/L (ref 135–145)
Total Bilirubin: 0.6 mg/dL (ref 0.3–1.2)
Total Protein: 7 g/dL (ref 6.5–8.1)

## 2020-01-28 LAB — CBC WITH DIFFERENTIAL/PLATELET
Abs Immature Granulocytes: 0.02 10*3/uL (ref 0.00–0.07)
Basophils Absolute: 0.1 10*3/uL (ref 0.0–0.1)
Basophils Relative: 1 %
Eosinophils Absolute: 0.1 10*3/uL (ref 0.0–0.5)
Eosinophils Relative: 1 %
HCT: 45.3 % (ref 36.0–46.0)
Hemoglobin: 14.9 g/dL (ref 12.0–15.0)
Immature Granulocytes: 0 %
Lymphocytes Relative: 31 %
Lymphs Abs: 2.2 10*3/uL (ref 0.7–4.0)
MCH: 29.3 pg (ref 26.0–34.0)
MCHC: 32.9 g/dL (ref 30.0–36.0)
MCV: 89 fL (ref 80.0–100.0)
Monocytes Absolute: 0.7 10*3/uL (ref 0.1–1.0)
Monocytes Relative: 10 %
Neutro Abs: 3.9 10*3/uL (ref 1.7–7.7)
Neutrophils Relative %: 57 %
Platelets: 317 10*3/uL (ref 150–400)
RBC: 5.09 MIL/uL (ref 3.87–5.11)
RDW: 14 % (ref 11.5–15.5)
WBC: 7 10*3/uL (ref 4.0–10.5)
nRBC: 0 % (ref 0.0–0.2)

## 2020-01-28 LAB — LIPASE, BLOOD: Lipase: 26 U/L (ref 11–51)

## 2020-01-28 MED ORDER — MECLIZINE HCL 25 MG PO TABS: 25.0000 mg | ORAL_TABLET | Freq: Three times a day (TID) | ORAL | 0 refills | Status: DC | PRN

## 2020-01-28 MED ORDER — ONDANSETRON 4 MG PO TBDP: 4.0000 mg | ORAL_TABLET | Freq: Three times a day (TID) | ORAL | 0 refills | Status: DC | PRN

## 2020-01-28 NOTE — ED Triage Notes (Signed)
Patient c/o dizziness that started Saturday morning. She states she went to get out of bed and was unable to stand up.

## 2020-01-28 NOTE — Discharge Instructions (Signed)
Medication as prescribed.  If you do not improve or you worsen, go to the ER.  Take care  Dr. Lacinda Axon

## 2020-01-28 NOTE — ED Provider Notes (Addendum)
[facility]    CSN: [id number] Arrival date & time: 01/28/20  1447  History   Chief Complaint Chief Complaint  Patient presents with  . Dizziness   HPI  86 year old female presents with dizziness.  Patient reports that her symptoms started on Saturday.  She has had dizziness as well as nausea and vomiting.  Patient states that her dizziness feels similar to prior episodes of vertigo.  She states it is worse with movement.  She has taken some expired meclizine with improvement but without resolution.  Patient seems very anxious and is very concerned about her symptoms.  Patient reports that she had a fall approximately 1 month ago.  She has had no recent injury.  No vision changes.  No speech difficulty.  No weakness.  No other complaints.  Past Medical History:  Diagnosis Date  . Cancer ([place])   . Hypertension     Patient Active Problem List   Diagnosis Date Noted  . Hip fracture ([place]) 05/19/2018  . Depression with anxiety 12/09/2016  . Numbness of foot 12/09/2016  . Macular degeneration 12/09/2016  . Gait instability 12/08/2016  . Upper respiratory infection, viral 12/06/2016  . Primary osteoarthritis of both knees 07/20/2016  . Postmenopausal osteoporosis 04/15/2016  . Chest pain 03/19/2016  . Spinal stenosis 08/04/2015  . Myalgia 06/11/2015  . Polyarthralgia 06/11/2015  . Chronic midline low back pain with bilateral sciatica 01/10/2015  . Benign essential hypertension 06/04/2014  . CKD (chronic kidney disease) stage 3, GFR 30-59 ml/min (HCC) 06/04/2014  . Mixed hyperlipidemia 06/04/2014  . Other allergic rhinitis 06/04/2014  . DDD (degenerative disc disease), lumbar 09/07/2013  . Lumbar radiculitis 09/07/2013  . Hypothyroidism, unspecified 08/02/2013    Past Surgical History:  Procedure Laterality Date  . ABDOMINAL HYSTERECTOMY    . HIP ARTHROPLASTY Right 05/20/2018   Procedure: ARTHROPLASTY BIPOLAR HIP (HEMIARTHROPLASTY);  Surgeon: Thornton Park,  MD;  Location: [facility] ORS;  Service: Orthopedics;  Laterality: Right;    OB History   No obstetric history on file.      Home Medications    Prior to Admission medications   Medication Sig Start Date End Date Taking? Authorizing Provider  Cholecalciferol (VITAMIN D3) 25 MCG (1000 UT) CAPS Take 1 capsule (1,000 Units total) by mouth daily. 05/23/18  Yes Demetrios Loll, MD  COMBIVENT RESPIMAT 20-100 MCG/ACT AERS respimat Inhale 2 puffs into the lungs in the morning, at noon, and at bedtime. 05/03/19  Yes [provider]  docusate sodium (COLACE) 100 MG capsule Take 100 mg by mouth as needed for constipation.   Yes [provider]  DULoxetine (CYMBALTA) 30 MG capsule Take 1 capsule by mouth 2 (two) times daily. 11/03/16  Yes [provider]  fluticasone (FLONASE) 50 MCG/ACT nasal spray Place 1 spray into both nostrils as needed. 11/02/16  Yes [provider]  gabapentin (NEURONTIN) 300 MG capsule Take 2 capsules (600 mg total) by mouth 2 (two) times daily. 12/09/16  Yes Plonk, Gwyndolyn Saxon, MD  ipratropium (ATROVENT) 0.06 % nasal spray Place 2 sprays into both nostrils 4 (four) times daily as needed for rhinitis. 05/13/19  Yes Hidaya Daniel G, DO  levothyroxine (SYNTHROID, LEVOTHROID) 100 MCG tablet Take 1 tablet (100 mcg total) by mouth daily before breakfast. 05/24/18  Yes Demetrios Loll, MD  LORazepam (ATIVAN) 0.5 MG tablet Take 1 tablet (0.5 mg total) by mouth 2 (two) times daily. 03/20/16  Yes Hillary Bow, MD  Multiple Vitamins-Minerals (PRESERVISION/LUTEIN) CAPS Take 1 capsule by mouth 2 (two)  times daily.   Yes [provider]  bisacodyl (DULCOLAX) 10 MG suppository Place 1 suppository (10 mg total) rectally daily as needed for moderate constipation. 05/23/18   Demetrios Loll, MD  meclizine (ANTIVERT) 25 MG tablet Take 1 tablet (25 mg total) by mouth 3 (three) times daily as needed for dizziness. 01/28/20   Coral Spikes, DO  ondansetron (ZOFRAN ODT) 4 MG disintegrating  tablet Take 1 tablet (4 mg total) by mouth every 8 (eight) hours as needed for nausea or vomiting. 01/28/20   Thersa Salt G, DO  enoxaparin (LOVENOX) 40 MG/0.4ML injection Inject 0.4 mLs (40 mg total) into the skin daily for 28 days. 05/24/18 05/13/19  Demetrios Loll, MD  losartan (COZAAR) 25 MG tablet Take 1 tablet (25 mg total) by mouth daily. 05/23/18 05/13/19  Demetrios Loll, MD  metoprolol succinate (TOPROL-XL) 25 MG 24 hr tablet Take 1 tablet by mouth daily. 03/21/19 01/28/20  [provider]    Family History Family History  Problem Relation Age of Onset  . Testicular cancer Father     Social History Social History   Tobacco Use  . Smoking status: Never Smoker  . Smokeless tobacco: Never Used  Vaping Use  . Vaping Use: Never used  Substance Use Topics  . Alcohol use: No  . Drug use: No     Allergies   Amoxicillin-pot clavulanate, Cephalexin, Hydrochlorothiazide, Hydrocodone-chlorpheniramine, Hydrocodone-homatropine, Metronidazole, Moxifloxacin, Prednisone, Simvastatin, Sulfa antibiotics, Sulfasalazine, Tramadol, Celecoxib, Codeine, Cyclobenzaprine, Oxycodone-acetaminophen, and Penicillin v potassium   Review of Systems Review of Systems  Eyes: Negative.   Gastrointestinal: Positive for nausea and vomiting.  Neurological: Positive for dizziness.   Physical Exam Triage Vital Signs ED Triage Vitals  Enc Vitals Group     BP 01/28/20 1625 (!) 158/88     Pulse Rate 01/28/20 1625 92     Resp 01/28/20 1625 18     Temp 01/28/20 1625 98 F (36.7 C)     Temp Source 01/28/20 1625 Oral     SpO2 01/28/20 1625 97 %     Weight 01/28/20 1622 165 lb (74.8 kg)     Height 01/28/20 1622 5\' 4"  (1.626 m)     Head Circumference --      Peak Flow --      Pain Score 01/28/20 1621 0     Pain Loc --      Pain Edu? --      Excl. in [place]? --    Updated Vital Signs BP (!) 158/88 (BP Location: Right Arm)   Pulse 92   Temp 98 F (36.7 C) (Oral)   Resp 18   Ht 5\' 4"  (1.626 m)   Wt 74.8 kg    SpO2 97%   BMI 28.32 kg/m   Visual Acuity Right Eye Distance:   Left Eye Distance:   Bilateral Distance:    Right Eye Near:   Left Eye Near:    Bilateral Near:     Physical Exam Vitals and nursing note reviewed.  Constitutional:      General: She is not in acute distress.    Appearance: Normal appearance. She is not ill-appearing.  HENT:     Head: Normocephalic and atraumatic.  Eyes:     General:        Right eye: No discharge.        Left eye: No discharge.     Conjunctiva/sclera: Conjunctivae normal.  Cardiovascular:     Rate and Rhythm: Normal rate and regular rhythm.  Pulmonary:     Effort: Pulmonary effort is normal.     Breath sounds: Normal breath sounds. No wheezing, rhonchi or rales.  Abdominal:     General: There is no distension.     Palpations: Abdomen is soft.     Tenderness: There is no abdominal tenderness.  Neurological:     General: No focal deficit present.     Mental Status: She is alert.     Cranial Nerves: No cranial nerve deficit.     Motor: No weakness.  Psychiatric:     Comments: Very anxious.    UC Treatments / Results  Labs (all labs ordered are listed, but only abnormal results are displayed) Labs Reviewed  COMPREHENSIVE METABOLIC PANEL - Abnormal; Notable for the following components:      Result Value   Chloride 95 (*)    Glucose, Bld 100 (*)    Creatinine, Ser 1.17 (*)    GFR, Estimated 45 (*)    All other components within normal limits  CBC WITH DIFFERENTIAL/PLATELET  LIPASE, BLOOD    EKG Interpretation: Sinus rhythm with rate of 88.  With axis.  No ST or T wave changes.  Unremarkable EKG.  Radiology No results found.  Procedures Procedures (including critical care time)  Medications Ordered in UC Medications - No data to display  Initial Impression / Assessment and Plan / UC Course  I have reviewed the triage vital signs and the nursing notes.  Pertinent labs & imaging results that were available during my  care of the patient were reviewed by me and considered in my medical decision making (see chart for details).    86 year old female presents with dizziness.  Consistent with vertigo.  Neurological exam unremarkable.  Meclizine and Zofran as needed.  Push fluids.  Supportive care.  If she fails to improve or worsens, she should go to the ER for further evaluation and management.  Final Clinical Impressions(s) / UC Diagnoses   Final diagnoses:  Dizziness     Discharge Instructions     Medication as prescribed.  If you do not improve or you worsen, go to the ER.  Take care  Dr. Lacinda Axon    ED Prescriptions    Medication Sig Dispense Auth. Provider   meclizine (ANTIVERT) 25 MG tablet Take 1 tablet (25 mg total) by mouth 3 (three) times daily as needed for dizziness. 30 tablet Cerys Winget G, DO   ondansetron (ZOFRAN ODT) 4 MG disintegrating tablet Take 1 tablet (4 mg total) by mouth every 8 (eight) hours as needed for nausea or vomiting. 20 tablet Coral Spikes, DO     PDMP not reviewed this encounter.   Coral Spikes, [place] 01/28/20 2036

## 2020-04-16 ENCOUNTER — Other Ambulatory Visit: Payer: Self-pay | Admitting: Family Medicine

## 2020-04-16 DIAGNOSIS — M549 Dorsalgia, unspecified: Secondary | ICD-10-CM

## 2020-04-17 ENCOUNTER — Ambulatory Visit
Admission: RE | Admit: 2020-04-17 | Discharge: 2020-04-17 | Disposition: A | Discharge status: Home / Self Care | Payer: Medicare PPO | Source: Ambulatory Visit | Attending: Family Medicine | Admitting: Family Medicine

## 2020-04-17 ENCOUNTER — Other Ambulatory Visit: Payer: Self-pay

## 2020-04-17 DIAGNOSIS — S22000A Wedge compression fracture of unspecified thoracic vertebra, initial encounter for closed fracture: Secondary | ICD-10-CM | POA: Diagnosis not present

## 2020-04-17 DIAGNOSIS — S22010A Wedge compression fracture of first thoracic vertebra, initial encounter for closed fracture: Secondary | ICD-10-CM | POA: Diagnosis not present

## 2020-04-17 DIAGNOSIS — M549 Dorsalgia, unspecified: Secondary | ICD-10-CM

## 2020-04-18 ENCOUNTER — Other Ambulatory Visit: Payer: Self-pay | Admitting: Physical Medicine and Rehabilitation

## 2020-04-18 ENCOUNTER — Other Ambulatory Visit (HOSPITAL_COMMUNITY): Payer: Self-pay | Admitting: Physical Medicine and Rehabilitation

## 2020-04-18 DIAGNOSIS — Z8781 Personal history of (healed) traumatic fracture: Secondary | ICD-10-CM

## 2020-04-19 ENCOUNTER — Inpatient Hospital Stay
Admission: EM | Admit: 2020-04-19 | Discharge: 2020-04-23 | DRG: 552 | Disposition: A | Discharge status: Skilled Nursing Facility | Payer: Medicare PPO | Attending: Internal Medicine | Admitting: Internal Medicine

## 2020-04-19 ENCOUNTER — Emergency Department: Payer: Medicare PPO

## 2020-04-19 ENCOUNTER — Other Ambulatory Visit: Payer: Self-pay

## 2020-04-19 ENCOUNTER — Encounter: Payer: Self-pay | Admitting: Family Medicine

## 2020-04-19 DIAGNOSIS — S22010A Wedge compression fracture of first thoracic vertebra, initial encounter for closed fracture: Principal | ICD-10-CM | POA: Diagnosis present

## 2020-04-19 DIAGNOSIS — Z882 Allergy status to sulfonamides status: Secondary | ICD-10-CM

## 2020-04-19 DIAGNOSIS — G4734 Idiopathic sleep related nonobstructive alveolar hypoventilation: Secondary | ICD-10-CM

## 2020-04-19 DIAGNOSIS — Z880 Allergy status to penicillin: Secondary | ICD-10-CM | POA: Diagnosis not present

## 2020-04-19 DIAGNOSIS — S22060D Wedge compression fracture of T7-T8 vertebra, subsequent encounter for fracture with routine healing: Secondary | ICD-10-CM | POA: Diagnosis not present

## 2020-04-19 DIAGNOSIS — Z96641 Presence of right artificial hip joint: Secondary | ICD-10-CM | POA: Diagnosis present

## 2020-04-19 DIAGNOSIS — K5900 Constipation, unspecified: Secondary | ICD-10-CM | POA: Diagnosis present

## 2020-04-19 DIAGNOSIS — M84459A Pathological fracture, hip, unspecified, initial encounter for fracture: Secondary | ICD-10-CM

## 2020-04-19 DIAGNOSIS — M4854XA Collapsed vertebra, not elsewhere classified, thoracic region, initial encounter for fracture: Secondary | ICD-10-CM

## 2020-04-19 DIAGNOSIS — S22060A Wedge compression fracture of T7-T8 vertebra, initial encounter for closed fracture: Secondary | ICD-10-CM | POA: Diagnosis present

## 2020-04-19 DIAGNOSIS — E039 Hypothyroidism, unspecified: Secondary | ICD-10-CM | POA: Diagnosis present

## 2020-04-19 DIAGNOSIS — Z20822 Contact with and (suspected) exposure to covid-19: Secondary | ICD-10-CM | POA: Diagnosis present

## 2020-04-19 DIAGNOSIS — M40294 Other kyphosis, thoracic region: Secondary | ICD-10-CM | POA: Diagnosis present

## 2020-04-19 DIAGNOSIS — Z79899 Other long term (current) drug therapy: Secondary | ICD-10-CM | POA: Diagnosis not present

## 2020-04-19 DIAGNOSIS — Z90710 Acquired absence of both cervix and uterus: Secondary | ICD-10-CM | POA: Diagnosis not present

## 2020-04-19 DIAGNOSIS — W19XXXA Unspecified fall, initial encounter: Secondary | ICD-10-CM | POA: Diagnosis present

## 2020-04-19 DIAGNOSIS — S22080D Wedge compression fracture of T11-T12 vertebra, subsequent encounter for fracture with routine healing: Secondary | ICD-10-CM | POA: Diagnosis not present

## 2020-04-19 DIAGNOSIS — F419 Anxiety disorder, unspecified: Secondary | ICD-10-CM | POA: Diagnosis present

## 2020-04-19 DIAGNOSIS — Z888 Allergy status to other drugs, medicaments and biological substances status: Secondary | ICD-10-CM | POA: Diagnosis not present

## 2020-04-19 DIAGNOSIS — I10 Essential (primary) hypertension: Secondary | ICD-10-CM

## 2020-04-19 DIAGNOSIS — R531 Weakness: Secondary | ICD-10-CM

## 2020-04-19 DIAGNOSIS — H409 Unspecified glaucoma: Secondary | ICD-10-CM | POA: Diagnosis present

## 2020-04-19 DIAGNOSIS — N183 Chronic kidney disease, stage 3 unspecified: Secondary | ICD-10-CM | POA: Diagnosis not present

## 2020-04-19 DIAGNOSIS — Z885 Allergy status to narcotic agent status: Secondary | ICD-10-CM | POA: Diagnosis not present

## 2020-04-19 DIAGNOSIS — Y93K1 Activity, walking an animal: Secondary | ICD-10-CM | POA: Diagnosis not present

## 2020-04-19 DIAGNOSIS — G629 Polyneuropathy, unspecified: Secondary | ICD-10-CM | POA: Diagnosis present

## 2020-04-19 DIAGNOSIS — F418 Other specified anxiety disorders: Secondary | ICD-10-CM | POA: Diagnosis not present

## 2020-04-19 DIAGNOSIS — R0902 Hypoxemia: Secondary | ICD-10-CM | POA: Diagnosis not present

## 2020-04-19 DIAGNOSIS — Z79890 Hormone replacement therapy (postmenopausal): Secondary | ICD-10-CM | POA: Diagnosis not present

## 2020-04-19 DIAGNOSIS — Z7951 Long term (current) use of inhaled steroids: Secondary | ICD-10-CM

## 2020-04-19 DIAGNOSIS — S22000A Wedge compression fracture of unspecified thoracic vertebra, initial encounter for closed fracture: Secondary | ICD-10-CM | POA: Diagnosis present

## 2020-04-19 DIAGNOSIS — S22010D Wedge compression fracture of first thoracic vertebra, subsequent encounter for fracture with routine healing: Secondary | ICD-10-CM | POA: Diagnosis not present

## 2020-04-19 DIAGNOSIS — S72009A Fracture of unspecified part of neck of unspecified femur, initial encounter for closed fracture: Secondary | ICD-10-CM | POA: Diagnosis present

## 2020-04-19 DIAGNOSIS — I129 Hypertensive chronic kidney disease with stage 1 through stage 4 chronic kidney disease, or unspecified chronic kidney disease: Secondary | ICD-10-CM | POA: Diagnosis present

## 2020-04-19 DIAGNOSIS — E782 Mixed hyperlipidemia: Secondary | ICD-10-CM | POA: Diagnosis present

## 2020-04-19 DIAGNOSIS — N182 Chronic kidney disease, stage 2 (mild): Secondary | ICD-10-CM | POA: Diagnosis present

## 2020-04-19 DIAGNOSIS — R32 Unspecified urinary incontinence: Secondary | ICD-10-CM | POA: Diagnosis present

## 2020-04-19 DIAGNOSIS — H3530 Unspecified macular degeneration: Secondary | ICD-10-CM | POA: Diagnosis present

## 2020-04-19 LAB — CBC WITH DIFFERENTIAL/PLATELET
Abs Immature Granulocytes: 0.04 10*3/uL (ref 0.00–0.07)
Basophils Absolute: 0.1 10*3/uL (ref 0.0–0.1)
Basophils Relative: 1 %
Eosinophils Absolute: 0.2 10*3/uL (ref 0.0–0.5)
Eosinophils Relative: 2 %
HCT: 44.4 % (ref 36.0–46.0)
Hemoglobin: 14.4 g/dL (ref 12.0–15.0)
Immature Granulocytes: 0 %
Lymphocytes Relative: 11 %
Lymphs Abs: 1.2 10*3/uL (ref 0.7–4.0)
MCH: 28.7 pg (ref 26.0–34.0)
MCHC: 32.4 g/dL (ref 30.0–36.0)
MCV: 88.6 fL (ref 80.0–100.0)
Monocytes Absolute: 0.8 10*3/uL (ref 0.1–1.0)
Monocytes Relative: 8 %
Neutro Abs: 8.5 10*3/uL — ABNORMAL HIGH (ref 1.7–7.7)
Neutrophils Relative %: 78 %
Platelets: 329 10*3/uL (ref 150–400)
RBC: 5.01 MIL/uL (ref 3.87–5.11)
RDW: 13.6 % (ref 11.5–15.5)
WBC: 10.8 10*3/uL — ABNORMAL HIGH (ref 4.0–10.5)
nRBC: 0 % (ref 0.0–0.2)

## 2020-04-19 LAB — COMPREHENSIVE METABOLIC PANEL
ALT: 10 U/L (ref 0–44)
AST: 16 U/L (ref 15–41)
Albumin: 3.7 g/dL (ref 3.5–5.0)
Alkaline Phosphatase: 61 U/L (ref 38–126)
Anion gap: 8 (ref 5–15)
BUN: 17 mg/dL (ref 8–23)
CO2: 28 mmol/L (ref 22–32)
Calcium: 9.4 mg/dL (ref 8.9–10.3)
Chloride: 99 mmol/L (ref 98–111)
Creatinine, Ser: 0.82 mg/dL (ref 0.44–1.00)
GFR, Estimated: >60 mL/min (ref >60)
Glucose, Bld: 132 mg/dL — ABNORMAL HIGH (ref 70–99)
Potassium: 3.9 mmol/L (ref 3.5–5.1)
Sodium: 135 mmol/L (ref 135–145)
Total Bilirubin: 0.9 mg/dL (ref 0.3–1.2)
Total Protein: 7.2 g/dL (ref 6.5–8.1)

## 2020-04-19 LAB — RESP PANEL BY RT-PCR (FLU A&B, COVID) ARPGX2
Influenza A by PCR: NEGATIVE
Influenza B by PCR: NEGATIVE
SARS Coronavirus 2 by RT PCR: NEGATIVE

## 2020-04-19 LAB — URINALYSIS, COMPLETE (UACMP) WITH MICROSCOPIC
Bacteria, UA: NONE SEEN
Bilirubin Urine: NEGATIVE
Glucose, UA: NEGATIVE mg/dL
Hgb urine dipstick: NEGATIVE
Ketones, ur: 5 mg/dL — AB
Nitrite: NEGATIVE
Protein, ur: NEGATIVE mg/dL
Specific Gravity, Urine: 1.009 (ref 1.005–1.030)
Squamous Epithelial / HPF: NONE SEEN (ref 0–5)
pH: 8 (ref 5.0–8.0)

## 2020-04-19 LAB — TROPONIN I (HIGH SENSITIVITY): Troponin I (High Sensitivity): 4 ng/L (ref <18)

## 2020-04-19 LAB — CK: Total CK: 34 U/L — ABNORMAL LOW (ref 38–234)

## 2020-04-19 MED ORDER — HYDROXYZINE HCL 25 MG PO TABS
25.0000 mg | ORAL_TABLET | Freq: Three times a day (TID) | ORAL | Status: DC | PRN
  Administered 2020-04-19 – 2020-04-22 (×3): 25 mg via ORAL
  Filled 2020-04-19 (×3): qty 1

## 2020-04-19 MED ORDER — FENTANYL CITRATE (PF) 100 MCG/2ML IJ SOLN
12.5000 ug | INTRAMUSCULAR | Status: DC | PRN
  Filled 2020-04-19: qty 2

## 2020-04-19 MED ORDER — LATANOPROST 0.005 % OP SOLN
1.0000 [drp] | Freq: Every day | OPHTHALMIC | Status: DC
  Administered 2020-04-19 – 2020-04-22 (×4): 1 [drp] via OPHTHALMIC
  Filled 2020-04-19: qty 2.5

## 2020-04-19 MED ORDER — ACETAMINOPHEN 325 MG PO TABS
650.0000 mg | ORAL_TABLET | Freq: Four times a day (QID) | ORAL | Status: DC | PRN
  Administered 2020-04-19 – 2020-04-22 (×2): 650 mg via ORAL
  Filled 2020-04-19 (×3): qty 2

## 2020-04-19 MED ORDER — POLYETHYLENE GLYCOL 3350 17 G PO PACK
17.0000 g | PACK | Freq: Every day | ORAL | Status: DC | PRN
  Administered 2020-04-21: 17 g via ORAL
  Filled 2020-04-19: qty 1

## 2020-04-19 MED ORDER — MORPHINE SULFATE (PF) 2 MG/ML IV SOLN
2.0000 mg | Freq: Once | INTRAVENOUS | Status: AC
  Administered 2020-04-19: 2 mg via INTRAVENOUS
  Filled 2020-04-19: qty 1

## 2020-04-19 MED ORDER — IPRATROPIUM-ALBUTEROL 20-100 MCG/ACT IN AERS
2.0000 | INHALATION_SPRAY | Freq: Three times a day (TID) | RESPIRATORY_TRACT | Status: DC
  Administered 2020-04-19 – 2020-04-23 (×11): 2 via RESPIRATORY_TRACT
  Filled 2020-04-19: qty 4

## 2020-04-19 MED ORDER — OCUVITE-LUTEIN PO CAPS
1.0000 | ORAL_CAPSULE | Freq: Two times a day (BID) | ORAL | Status: DC
  Administered 2020-04-19 – 2020-04-23 (×8): 1 via ORAL
  Filled 2020-04-19 (×10): qty 1

## 2020-04-19 MED ORDER — DULOXETINE HCL 30 MG PO CPEP
30.0000 mg | ORAL_CAPSULE | Freq: Two times a day (BID) | ORAL | Status: DC
  Administered 2020-04-19 – 2020-04-23 (×8): 30 mg via ORAL
  Filled 2020-04-19 (×8): qty 1

## 2020-04-19 MED ORDER — LEVOTHYROXINE SODIUM 50 MCG PO TABS
50.0000 ug | ORAL_TABLET | Freq: Every day | ORAL | Status: DC
  Administered 2020-04-21 – 2020-04-23 (×3): 50 ug via ORAL
  Filled 2020-04-19 (×3): qty 1

## 2020-04-19 MED ORDER — ONDANSETRON HCL 4 MG/2ML IJ SOLN
4.0000 mg | Freq: Once | INTRAMUSCULAR | Status: AC
  Administered 2020-04-19: 4 mg via INTRAVENOUS
  Filled 2020-04-19: qty 2

## 2020-04-19 MED ORDER — ACETAMINOPHEN 650 MG RE SUPP: 650.0000 mg | Freq: Four times a day (QID) | RECTAL | Status: DC | PRN

## 2020-04-19 MED ORDER — OXYCODONE HCL 5 MG PO TABS
5.0000 mg | ORAL_TABLET | ORAL | Status: DC | PRN
  Administered 2020-04-20 – 2020-04-21 (×4): 5 mg via ORAL
  Filled 2020-04-19 (×4): qty 1

## 2020-04-19 MED ORDER — ONDANSETRON HCL 4 MG PO TABS
4.0000 mg | ORAL_TABLET | Freq: Four times a day (QID) | ORAL | Status: DC | PRN
  Administered 2020-04-20: 4 mg via ORAL
  Filled 2020-04-19: qty 1

## 2020-04-19 MED ORDER — TRAZODONE HCL 50 MG PO TABS: 50.0000 mg | ORAL_TABLET | Freq: Every evening | ORAL | Status: DC | PRN

## 2020-04-19 MED ORDER — ONDANSETRON HCL 4 MG/2ML IJ SOLN
4.0000 mg | Freq: Four times a day (QID) | INTRAMUSCULAR | Status: DC | PRN
  Administered 2020-04-19: 4 mg via INTRAVENOUS
  Filled 2020-04-19 (×2): qty 2

## 2020-04-19 MED ORDER — METAXALONE 800 MG PO TABS
400.0000 mg | ORAL_TABLET | Freq: Three times a day (TID) | ORAL | Status: DC
  Administered 2020-04-19 – 2020-04-23 (×11): 400 mg via ORAL
  Filled 2020-04-19 (×13): qty 0.5

## 2020-04-19 MED ORDER — TIMOLOL MALEATE 0.5 % OP SOLN
1.0000 [drp] | Freq: Every day | OPHTHALMIC | Status: DC
  Administered 2020-04-19 – 2020-04-23 (×5): 1 [drp] via OPHTHALMIC
  Filled 2020-04-19: qty 5

## 2020-04-19 MED ORDER — GABAPENTIN 400 MG PO CAPS
800.0000 mg | ORAL_CAPSULE | Freq: Two times a day (BID) | ORAL | Status: DC | PRN
Start: 2020-04-19 — End: 2020-04-21
  Administered 2020-04-20: 800 mg via ORAL
  Filled 2020-04-19: qty 2

## 2020-04-19 NOTE — ED Notes (Signed)
Warm blanket given. Pt noted to be resting quietly in bed, watching tv at this time.

## 2020-04-19 NOTE — ED Notes (Signed)
Pt asks to pee- pt educated on purewick.

## 2020-04-19 NOTE — ED Notes (Signed)
Pt brief changed at this time. Pt brief noted to be clean and dry at this time. Purewick in place at this time.

## 2020-04-19 NOTE — ED Notes (Signed)
Pt personal house keys placed in bag with pt label on it at bedside at this time. Pt informed and reminded of bag location at this time.

## 2020-04-19 NOTE — ED Notes (Addendum)
Messaged admitting MD regarding pt diet orders at this time. Per MD Dione Plover, pt can eat and drink at this time.

## 2020-04-19 NOTE — ED Notes (Signed)
Per Edd Arbour, ED Secretary, called in order for TLSO brace at this time

## 2020-04-19 NOTE — ED Notes (Signed)
Pt friend Alexandria Roman called and updated with pt permission at this time. Pt given unit phone to speak with friend Patty at this time.

## 2020-04-19 NOTE — ED Notes (Signed)
Pt to CT at this time.

## 2020-04-19 NOTE — Progress Notes (Signed)
Orthopedic Tech Progress Note Patient Details:  Alexandria Roman October 09, 1929 [id number] Called in order to [facility] for a stat order for a TLSO BRACE. Patient will be discharged  Patient ID: Alexandria Roman, female   DOB: Oct 19, 1929, 87 y.o.   MRN: [id number]   Janit Pagan 04/19/2020, 1:27 PM

## 2020-04-19 NOTE — ED Notes (Signed)
Called ortho tech for brace will bring brace to patient 1323

## 2020-04-19 NOTE — Progress Notes (Signed)
Patient arrived to floor and max assist from stretcher to bed.  Oriented to room and call bell.  Patient states feeling very claustrophobic due to brace being so heavy on her chest.  Patient states feels nauseous, med given.  And requested to call son and friend.  Attempted to call son but reached voicemail and no other numbers found in chart and patient could not remember friends phone number.  Support given to patient.

## 2020-04-19 NOTE — H&P (Addendum)
[facility] History and Physical  Alexandria Roman DJM:[id number] DOB: 1929-09-24 DOA: 04/19/2020  Referring physician: Dr. Cherylann Banas PCP: Adin Hector, MD   Chief Complaint: back pain  HPI: Alexandria Roman is a 87 y.o. female with history of hypertension, CKD, hypothyroidism, hyperlipidemia, depression and anxiety, hip fracture, who presents with back pain.  Review of chart patient was seen by [facility] at the [facility] clinic 3 days prior for acute low back pain that had started a week prior to the consultation after falling while walking her dog.  At that time reported that she intermittently used a rolling walker for ambulation at baseline.  Note also notes a prior MRI from February 2019 showed severe lumbar canal stenosis as well as a history of having an L1 compression fracture and subsequent surgical repair in December 2018.  Clinical evaluation was consistent with new acute T8 fracture for which they ordered a stat MRI that was done the next day and showed new compression fractions of both T1 and T8.  Today patient confirms the above history.  Reports that she has been using her walker to ambulate but has gotten progressively more painful and this morning she was in so much pain she was unable to get out of bed so she called 911.  Denies any numbness or tingling in her lower extremities that does have some longstanding tingling in her upper extremities that has been worked up previously.  In the ED initial vital signs notable only for hypertension.  MP, CBC, troponin, UA, and Covid/flu test were all unremarkable.  EKG unremarkable.  Chest x-ray unremarkable.  CT head without acute findings and chronic white matter changes.  CT thoracic spine confirmed prior MRI findings of T1 and T8 compression fractures.  She was given medications for pain and nausea.  Dr. Cari Caraway from neurosurgery was consulted and recommended TLSO brace, admission for pain control and PT/OT, and reevaluation in 36  hours for possible surgical intervention if there is no improvement by that time.  Review of Systems:  Pertinent positives and negative per HPI, all others reviewed and negative  Past Medical History:  Diagnosis Date  . Cancer ([place])   . Hypertension    Past Surgical History:  Procedure Laterality Date  . ABDOMINAL HYSTERECTOMY    . HIP ARTHROPLASTY Right 05/20/2018   Procedure: ARTHROPLASTY BIPOLAR HIP (HEMIARTHROPLASTY);  Surgeon: Thornton Park, MD;  Location: [facility] ORS;  Service: Orthopedics;  Laterality: Right;   Social History:  reports that she has never smoked. She has never used smokeless tobacco. She reports that she does not drink alcohol and does not use drugs.  Allergies  Allergen Reactions  . Amoxicillin-Pot Clavulanate Diarrhea  . Cephalexin Other (See Comments) and Nausea And Vomiting  . Hydrochlorothiazide Other (See Comments) and Nausea And Vomiting  . Hydrocodone-Chlorpheniramine Other (See Comments)  . Hydrocodone-Homatropine Other (See Comments)  . Metronidazole Other (See Comments)    Other reaction(s): Other (See Comments) Other Reaction: Not Assessed Other Reaction: Not Assessed  . Moxifloxacin Other (See Comments)  . Prednisone Nausea Only  . Simvastatin Other (See Comments)  . Sulfa Antibiotics Other (See Comments)    Other reaction(s): Unknown  . Sulfasalazine Other (See Comments)    Other reaction(s): Unknown  . Tramadol Other (See Comments)  . Celecoxib Nausea And Vomiting  . Codeine Nausea Only    Other reaction(s): Other (See Comments) Other reaction(s): Unknown  . Cyclobenzaprine Other (See Comments) and Nausea Only    Other reaction(s): Other (See  Comments)  . Oxycodone-Acetaminophen Nausea Only and Nausea And Vomiting  . Penicillin V Potassium Rash    Other reaction(s): Other (See Comments) Other reaction(s): Unknown    Family History  Problem Relation Age of Onset  . Testicular cancer Father      Prior to Admission medications    Medication Sig Start Date End Date Taking? Authorizing Provider  bisacodyl (DULCOLAX) 10 MG suppository Place 1 suppository (10 mg total) rectally daily as needed for moderate constipation. 05/23/18   Demetrios Loll, MD  Cholecalciferol (VITAMIN D3) 25 MCG (1000 UT) CAPS Take 1 capsule (1,000 Units total) by mouth daily. 05/23/18   Demetrios Loll, MD  COMBIVENT RESPIMAT 20-100 MCG/ACT AERS respimat Inhale 2 puffs into the lungs in the morning, at noon, and at bedtime. 05/03/19   [provider]  docusate sodium (COLACE) 100 MG capsule Take 100 mg by mouth as needed for constipation.    [provider]  DULoxetine (CYMBALTA) 30 MG capsule Take 1 capsule by mouth 2 (two) times daily. 11/03/16   [provider]  fluticasone (FLONASE) 50 MCG/ACT nasal spray Place 1 spray into both nostrils as needed. 11/02/16   [provider]  gabapentin (NEURONTIN) 300 MG capsule Take 2 capsules (600 mg total) by mouth 2 (two) times daily. 12/09/16   Plonk, Gwyndolyn Saxon, MD  ipratropium (ATROVENT) 0.06 % nasal spray Place 2 sprays into both nostrils 4 (four) times daily as needed for rhinitis. 05/13/19   Coral Spikes, DO  levothyroxine (SYNTHROID, LEVOTHROID) 100 MCG tablet Take 1 tablet (100 mcg total) by mouth daily before breakfast. 05/24/18   Demetrios Loll, MD  LORazepam (ATIVAN) 0.5 MG tablet Take 1 tablet (0.5 mg total) by mouth 2 (two) times daily. 03/20/16   Hillary Bow, MD  meclizine (ANTIVERT) 25 MG tablet Take 1 tablet (25 mg total) by mouth 3 (three) times daily as needed for dizziness. 01/28/20   Coral Spikes, DO  Multiple Vitamins-Minerals (PRESERVISION/LUTEIN) CAPS Take 1 capsule by mouth 2 (two) times daily.    [provider]  ondansetron (ZOFRAN ODT) 4 MG disintegrating tablet Take 1 tablet (4 mg total) by mouth every 8 (eight) hours as needed for nausea or vomiting. 01/28/20   Thersa Salt G, DO  enoxaparin (LOVENOX) 40 MG/0.4ML injection Inject 0.4 mLs (40 mg total) into the  skin daily for 28 days. 05/24/18 05/13/19  Demetrios Loll, MD  losartan (COZAAR) 25 MG tablet Take 1 tablet (25 mg total) by mouth daily. 05/23/18 05/13/19  Demetrios Loll, MD  metoprolol succinate (TOPROL-XL) 25 MG 24 hr tablet Take 1 tablet by mouth daily. 03/21/19 01/28/20  [provider]   Physical Exam: Vitals:   04/19/20 1115 04/19/20 1200 04/19/20 1300 04/19/20 1430  BP: (!) 165/99 (!) 162/106 (!) 175/101 (!) 146/135  Pulse: 82 84 88 99  Resp: 13 12 19 19   Temp:      TempSrc:      SpO2: 95% 95% 97% 100%  Weight:      Height:        Wt Readings from Last 3 Encounters:  04/19/20 74.8 kg  01/28/20 74.8 kg  05/13/19 77.1 kg     . General:  Appears calm and mildly uncomfortable . Eyes: PERRL, normal lids, irises & conjunctiva . ENT: grossly normal hearing, lips & tongue . Neck: no masses . Cardiovascular: RRR, no m/r/g. No LE edema. Marland Kitchen Respiratory: CTA bilaterally, no w/r/r. Normal respiratory effort. . Abdomen: soft, ntnd . Skin: no rash or  induration seen on limited exam . Musculoskeletal: grossly normal tone BUE/BLE . Psychiatric: grossly normal mood and affect, speech fluent and appropriate . Neurologic: grossly non-focal, strength 5/5 and sensation intact to light touch in bilateral lower extremitites          Labs on Admission:  Basic Metabolic Panel: Recent Labs  Lab 04/19/20 0856  NA 135  K 3.9  CL 99  CO2 28  GLUCOSE 132*  BUN 17  CREATININE 0.82  CALCIUM 9.4   Liver Function Tests: Recent Labs  Lab 04/19/20 0856  AST 16  ALT 10  ALKPHOS 61  BILITOT 0.9  PROT 7.2  ALBUMIN 3.7   No results for input(s): LIPASE, AMYLASE in the last 168 hours. No results for input(s): AMMONIA in the last 168 hours. CBC: Recent Labs  Lab 04/19/20 0856  WBC 10.8*  NEUTROABS 8.5*  HGB 14.4  HCT 44.4  MCV 88.6  PLT 329   Cardiac Enzymes: Recent Labs  Lab 04/19/20 0856  CKTOTAL 34*    BNP (last 3 results) No results for input(s): BNP in the last 8760  hours.  ProBNP (last 3 results) No results for input(s): PROBNP in the last 8760 hours.  CBG: No results for input(s): GLUCAP in the last 168 hours.  Radiological Exams on Admission: CT Head Wo Contrast  Result Date: 04/19/2020 CLINICAL DATA:  Mental status change. EXAM: CT HEAD WITHOUT CONTRAST TECHNIQUE: Contiguous axial images were obtained from the base of the skull through the vertex without intravenous contrast. COMPARISON:  March 19, 2016 FINDINGS: Brain: No subdural, epidural, or subarachnoid hemorrhage is. Ventricles and sulci are unremarkable. White matter changes are stable. Cerebellum, brainstem, and basal cisterns are normal. No acute cortical ischemia or infarct. No mass effect or midline shift. Vascular: Calcified atherosclerosis in the intracranial carotids. Skull: Normal. Negative for fracture or focal lesion. Sinuses/Orbits: Mild mucosal thickening in the right maxillary and left frontal sinuses. Other: None. IMPRESSION: No acute intracranial abnormalities.  Chronic white matter changes. Electronically Signed   By: Dorise Bullion III M.D   On: 04/19/2020 11:31   CT Thoracic Spine Wo Contrast  Result Date: 04/19/2020 CLINICAL DATA:  Fall with compression fractures EXAM: CT THORACIC SPINE WITHOUT CONTRAST TECHNIQUE: Multidetector CT images of the thoracic were obtained using the standard protocol without intravenous contrast. COMPARISON:  MRI 04/17/2020 FINDINGS: Alignment: Accentuation of kyphosis. Mild osseous retropulsion at T1. Vertebrae: T1 compression fracture as discussed on recent MRI. Fracture line extends through superior and inferior endplates with approximately 40% loss of height. There is likely involvement of the posterior margin of the vertebral body where there is mild retropulsion. Additional fracture of the left T1 lamina extending into the inferior facet without displacement. T8 compression fracture is also again identified with involvement of superior and inferior  endplates. Posterior vertebral body margin appears spared. Height loss is approximately 40%. Thoracic vertebral body heights are otherwise maintained. Partially imaged chronic L1 compression fracture with kyphoplasty. Paraspinal and other soft tissues: Large hiatal hernia. Disc levels: Degenerative changes and spinal canal are better evaluated on the prior MRI. IMPRESSION: T1 and T8 compression fractures with less than 50% loss of height. Additional involvement of the left T1 lamina extending into the inferior facet without displacement. Electronically Signed   By: Macy Mis M.D.   On: 04/19/2020 11:41   MR THORACIC SPINE WO CONTRAST  Result Date: 04/18/2020 CLINICAL DATA:  [place] 04/13/2020 with subsequent back pain. EXAM: MRI THORACIC SPINE WITHOUT CONTRAST TECHNIQUE: Multiplanar, multisequence MR  imaging of the thoracic spine was performed. No intravenous contrast was administered. COMPARISON:  None. FINDINGS: Alignment: Kyphosis from C7-T2 related to the T1 fracture as described below. Vertebrae: Recent compression fracture at T1 loss of height of 40%. Fracture line may extend all the way through the vertebral body. Consider CT for further characterization. There is facet and ligamentous hypertrophy at this level. Kyphotic curvature is present because of the fracture deformity. There is spinal stenosis with effacement of the subarachnoid space but no visible frank cord compression. There could be foraminal encroachment, particularly on the left. Inferior endplate edema anteriorly at T7 which could relate to a Schmorl's node or a mild recent partial inferior compression fracture. Compression deformity of T8, affecting the inferior endplate more than the superior endplate, with maximal loss of height centrally and posterior of 25%. No retropulsed bone. Old healed augmented fracture at L1. Cord: As noted above, there is spinal stenosis at the T1 level secondary to the fracture, but the cord is not clearly  compressed. Otherwise, the cord appears unremarkable. Paraspinal and other soft tissues: Negative Disc levels: T1-2: Spinal canal narrowing as above. Contributing factors include acute compression fracture of T1 with mild posterior bowing, bulging of the disc and prominence of the facets and ligaments. T2-3 through T11-12: Mild chronic disc degeneration with bulging but no compressive narrowing of the canal or foramina. Ordinary facet osteoarthritis. T12-L1 and L1-2: Bulging of the discs related to the old L1 compression fracture. Posterior bowing of the posterosuperior margin of the L1 vertebral body. Some narrowing of the spinal canal this level but no likely neural compression IMPRESSION: 1. Recent compression fracture at T1 with loss of height of 40%. Fracture line may extend all the way through the vertebral body. Consider CT for further characterization. Canal narrowing without clear cord compression. Foraminal narrowing left more than right. 2. Inferior endplate edema anteriorly at T7 which could relate to a Schmorl's node or a recent mild partial inferior compression fracture. 3. Acute or subacute fracture at T8 with maximal loss of height in the posterior central portion of 25%. No retropulsed bone. 4. Old healed augmented fracture at L1. 5. These results will be called to the ordering clinician or representative by the Radiologist Assistant, and communication documented in the PACS or [employer]. Electronically Signed   By: Nelson Chimes M.D.   On: 04/18/2020 11:52   DG Chest Portable 1 View  Result Date: 04/19/2020 CLINICAL DATA:  87 year old female with intermittent hypoxia. EXAM: PORTABLE CHEST 1 VIEW COMPARISON:  05/13/2019 FINDINGS: The patient is rotated to the right. Poor inspiratory effort. The cardiomediastinal silhouette is unchanged given differences in technique. No focal consolidations, significant pleural effusions, or pneumothorax. Status post L1 vertebral cement augmentation,  similar appearance. No acute osseous abnormality. IMPRESSION: Poor inspiratory effort limits evaluation. No acute cardiopulmonary process. Electronically Signed   By: Ruthann Cancer MD   On: 04/19/2020 10:08    EKG: Independently reviewed.  Sinus rhythm, premature complexes, no signs of ischemia.  Assessment/Plan Active Problems:   Benign essential hypertension   CKD (chronic kidney disease) stage 3, GFR 30-59 ml/min (HCC)   Hypothyroidism, unspecified   Mixed hyperlipidemia   Depression with anxiety   Hip fracture (HCC)   Thoracic compression fracture (HCC)   #Thoracic compression fracture Patient with significant pain and inability to ambulate, seen by neurosurgery who recommend admission for pain control and PT OT evaluation for possible surgical intervention if there is no improvement over the next 36 hours. -Maintain  TLSO brace -PT/OT eval -Neurosurgery recommendations appreciated -Pain meds as needed  #Known medical problems Neuropathy - continue duloxetine, gabapentin Macular degeneration - continue timolol, latanoprost Hypothyroidism - continue synthroid  Code Status: Full Code, confirmed DVT Prophylaxis: SCDs Family Communication: None Disposition Plan: Inpatient, Med-Surg   Time spent: 50 min  Clarnce Flock MD/MPH [facility]

## 2020-04-19 NOTE — ED Notes (Signed)
Pt changed after purewick fell out of place. Pt cleaned up and clean brief and chux applied at this time. Pt scooted up in bed at this time

## 2020-04-19 NOTE — ED Triage Notes (Signed)
Pt arrived via [facility] from home with c/o fall. Per EMS, pt was found in bed this AM stating she was trying to "push the red button to call 911 and nobody answered"  Per EMS, pt states she was walking her dog, got dizzy, and fell on her back/hit her head. Pt denies blood thinners at this time.   Per EMS, pt is unable to state when exactly she fell and states she is having trouble putting full sentences together. Pt stated to this RN "I think there's something wrong with my brain, I can't remember"    Pt able to answer all questions appropriately at this time.   Per EMS, pt has tramadol for back pain. Per pt, she is unable to remember if she has taken any as she just got the prescription.   EMS VS: 179/98, HR 90 NSR, CBG 106, temp 98.65F

## 2020-04-19 NOTE — ED Notes (Signed)
Called lab at this time to confirm they received blood work sent previously. Per lab, received blood and will run it shortly.

## 2020-04-19 NOTE — ED Notes (Signed)
Staff at bedside to fit TLSO brace at this time

## 2020-04-19 NOTE — ED Notes (Signed)
Pt given warm blanket and tv turned on again for pt comfort and to promote relaxation at this time.

## 2020-04-19 NOTE — ED Provider Notes (Signed)
[facility] - Inpatient Emergency Department Provider Note ____________________________________________   Event Date/Time   First MD Initiated Contact with Patient 04/19/20 [phone]     (approximate)  I have reviewed the triage vital signs and the nursing notes.   HISTORY  Chief Complaint Fall  Level 5 caveat: History of present illness limited due to possible altered mental status  HPI [name] is a 87 y.o. female with PMH as noted below who presents with back pain, although it is somewhat unclear why the patient decided to come to the ED today.  She states that she has had chronic back pain but it was also recently exacerbated after a fall while walking her dog over a week ago.  She has been evaluated for it as an outpatient and has some fractures in her spine.  She states that the pain has persisted and she has not been able to get out of bed much or move around.  However, she cannot identify a specific reason that she called EMS today.  She states that she got upset when her adopted son called her today.  She also states that she has been generally feeling unwell but cannot identify more specific symptoms.  She does have some urinary incontinence in the morning.    Past Medical History:  Diagnosis Date  . Cancer ([place])   . Hypertension     Patient Active Problem List   Diagnosis Date Noted  . Hip fracture ([place]) 05/19/2018  . Depression with anxiety 12/09/2016  . Numbness of foot 12/09/2016  . Macular degeneration 12/09/2016  . Gait instability 12/08/2016  . Upper respiratory infection, viral 12/06/2016  . Primary osteoarthritis of both knees 07/20/2016  . Postmenopausal osteoporosis 04/15/2016  . Chest pain 03/19/2016  . Spinal stenosis 08/04/2015  . Myalgia 06/11/2015  . Polyarthralgia 06/11/2015  . Chronic midline low back pain with bilateral sciatica 01/10/2015  . Benign essential hypertension 06/04/2014  . CKD (chronic kidney disease) stage 3, GFR  30-59 ml/min (HCC) 06/04/2014  . Mixed hyperlipidemia 06/04/2014  . Other allergic rhinitis 06/04/2014  . DDD (degenerative disc disease), lumbar 09/07/2013  . Lumbar radiculitis 09/07/2013  . Hypothyroidism, unspecified 08/02/2013    Past Surgical History:  Procedure Laterality Date  . ABDOMINAL HYSTERECTOMY    . HIP ARTHROPLASTY Right 05/20/2018   Procedure: ARTHROPLASTY BIPOLAR HIP (HEMIARTHROPLASTY);  Surgeon: Thornton Park, MD;  Location: [facility] ORS;  Service: Orthopedics;  Laterality: Right;    Prior to Admission medications   Medication Sig Start Date End Date Taking? Authorizing Provider  bisacodyl (DULCOLAX) 10 MG suppository Place 1 suppository (10 mg total) rectally daily as needed for moderate constipation. 05/23/18   Demetrios Loll, MD  Cholecalciferol (VITAMIN D3) 25 MCG (1000 UT) CAPS Take 1 capsule (1,000 Units total) by mouth daily. 05/23/18   Demetrios Loll, MD  COMBIVENT RESPIMAT 20-100 MCG/ACT AERS respimat Inhale 2 puffs into the lungs in the morning, at noon, and at bedtime. 05/03/19   [provider]  docusate sodium (COLACE) 100 MG capsule Take 100 mg by mouth as needed for constipation.    [provider]  DULoxetine (CYMBALTA) 30 MG capsule Take 1 capsule by mouth 2 (two) times daily. 11/03/16   [provider]  fluticasone (FLONASE) 50 MCG/ACT nasal spray Place 1 spray into both nostrils as needed. 11/02/16   [provider]  gabapentin (NEURONTIN) 300 MG capsule Take 2 capsules (600 mg total) by mouth 2 (two) times daily. 12/09/16   Plonk, Gwyndolyn Saxon,  MD  ipratropium (ATROVENT) 0.06 % nasal spray Place 2 sprays into both nostrils 4 (four) times daily as needed for rhinitis. 05/13/19   Coral Spikes, DO  levothyroxine (SYNTHROID, LEVOTHROID) 100 MCG tablet Take 1 tablet (100 mcg total) by mouth daily before breakfast. 05/24/18   Demetrios Loll, MD  LORazepam (ATIVAN) 0.5 MG tablet Take 1 tablet (0.5 mg total) by mouth 2 (two) times daily. 03/20/16    Hillary Bow, MD  meclizine (ANTIVERT) 25 MG tablet Take 1 tablet (25 mg total) by mouth 3 (three) times daily as needed for dizziness. 01/28/20   Coral Spikes, DO  Multiple Vitamins-Minerals (PRESERVISION/LUTEIN) CAPS Take 1 capsule by mouth 2 (two) times daily.    [provider]  ondansetron (ZOFRAN ODT) 4 MG disintegrating tablet Take 1 tablet (4 mg total) by mouth every 8 (eight) hours as needed for nausea or vomiting. 01/28/20   Thersa Salt G, DO  enoxaparin (LOVENOX) 40 MG/0.4ML injection Inject 0.4 mLs (40 mg total) into the skin daily for 28 days. 05/24/18 05/13/19  Demetrios Loll, MD  losartan (COZAAR) 25 MG tablet Take 1 tablet (25 mg total) by mouth daily. 05/23/18 05/13/19  Demetrios Loll, MD  metoprolol succinate (TOPROL-XL) 25 MG 24 hr tablet Take 1 tablet by mouth daily. 03/21/19 01/28/20  [provider]    Allergies Amoxicillin-pot clavulanate, Cephalexin, Hydrochlorothiazide, Hydrocodone-chlorpheniramine, Hydrocodone-homatropine, Metronidazole, Moxifloxacin, Prednisone, Simvastatin, Sulfa antibiotics, Sulfasalazine, Tramadol, Celecoxib, Codeine, Cyclobenzaprine, Oxycodone-acetaminophen, and Penicillin v potassium  Family History  Problem Relation Age of Onset  . Testicular cancer Father     Social History Social History   Tobacco Use  . Smoking status: Never Smoker  . Smokeless tobacco: Never Used  Vaping Use  . Vaping Use: Never used  Substance Use Topics  . Alcohol use: No  . Drug use: No    Review of Systems  Constitutional: No fever. Eyes: No redness. ENT: No sore throat. Cardiovascular: Denies chest pain. Respiratory: Denies shortness of breath. Gastrointestinal: No vomiting or diarrhea. Genitourinary: Negative for dysuria.  Musculoskeletal: Positive for back pain. Skin: Negative for rash. Neurological: Negative for headaches, focal weakness or numbness.   ____________________________________________   PHYSICAL EXAM:  VITAL SIGNS: ED  Triage Vitals  Enc Vitals Group     BP 04/19/20 0852 (!) 191/91     Pulse Rate 04/19/20 0852 92     Resp 04/19/20 0852 18     Temp 04/19/20 0907 98 F (36.7 C)     Temp Source 04/19/20 0907 Oral     SpO2 04/19/20 0852 100 %     Weight 04/19/20 0906 165 lb (74.8 kg)     Height 04/19/20 0906 5\' 2"  (1.575 m)     Head Circumference --      Peak Flow --      Pain Score 04/19/20 0853 10     Pain Loc --      Pain Edu? --      Excl. in [place]? --     Constitutional: Alert and oriented. Well appearing for age, in no acute distress. Eyes: Conjunctivae are normal.  EOMI.  PERRLA. Head: Atraumatic. Nose: No congestion/rhinnorhea. Mouth/Throat: Mucous membranes are moist.   Neck: Normal range of motion.  No midline cervical spinal tenderness. Cardiovascular: Normal rate, regular rhythm. Grossly normal heart sounds.  Good peripheral circulation. Respiratory: Normal respiratory effort.  No retractions. Lungs CTAB. Gastrointestinal: Soft and nontender. No distention.  Genitourinary: No CVA tenderness. Musculoskeletal: No lower extremity edema.  Extremities warm and  well perfused.  Moderate thoracic midline spinal tenderness. Neurologic:  Normal speech and language.  Motor intact in all extremities.  Normal coordination. Skin:  Skin is warm and dry. No rash noted. Psychiatric: Calm and cooperative.  ____________________________________________   LABS (all labs ordered are listed, but only abnormal results are displayed)  Labs Reviewed  COMPREHENSIVE METABOLIC PANEL - Abnormal; Notable for the following components:      Result Value   Glucose, Bld 132 (*)    All other components within normal limits  CBC WITH DIFFERENTIAL/PLATELET - Abnormal; Notable for the following components:   WBC 10.8 (*)    Neutro Abs 8.5 (*)    All other components within normal limits  URINALYSIS, COMPLETE (UACMP) WITH MICROSCOPIC - Abnormal; Notable for the following components:   Color, Urine STRAW (*)     APPearance CLEAR (*)    Ketones, ur 5 (*)    Leukocytes,Ua TRACE (*)    All other components within normal limits  CK - Abnormal; Notable for the following components:   Total CK 34 (*)    All other components within normal limits  RESP PANEL BY RT-PCR (FLU A&B, COVID) ARPGX2  TROPONIN I (HIGH SENSITIVITY)   ____________________________________________  EKG  ED ECG REPORT I, Arta Silence, the attending physician, personally viewed and interpreted this ECG.  Date: 04/19/2020 EKG Time: 0848 Rate: 96 Rhythm: normal sinus rhythm with PACs QRS Axis: normal Intervals: normal ST/T Wave abnormalities: normal Narrative Interpretation: no evidence of acute ischemia  ____________________________________________  RADIOLOGY  Chest x-ray interpreted by me shows no focal infiltrate or edema CT thoracic spine: T1 and T8 compression fractures CT head: No ICH or other acute abnormality ____________________________________________   PROCEDURES  Procedure(s) performed: No  Procedures  Critical Care performed: No ____________________________________________   INITIAL IMPRESSION / ASSESSMENT AND PLAN / ED COURSE  Pertinent labs & imaging results that were available during my care of the patient were reviewed by me and considered in my medical decision making (see chart for details).  87 year old female with PMH as noted above presents from home with subacute back pain although it is unclear exactly what prompted her to call EMS today.  EMS states they were called out for a fall, but the patient denies a fall today and was found in bed.  The patient lives alone and EMS describes a somewhat disorganized living situation with pill bottles in multiple different rooms and belongings everywhere.  The patient states that she has not been able to move around much due to her back pain.  She states she has not been feeling well but is not really sure why she called the ambulance.  I  reviewed the past medical records in epic and [place].  The patient had an MRI of the thoracic spine yesterday which shows recent compression fractures to T1 and T8.  She was seen 3 days ago by physical medicine and stated at that time that she fell while walking her dog about a week previously.  On exam the patient is comfortable appearing.  Her vital signs are normal except for mild hypertension.  She is alert and oriented x4 although she takes a long time to answer certain questions.  She is overall able to give a coherent history but at times seems slightly confused.  Neurologic exam is normal.  The remainder of the exam is unremarkable.  The persistent back pain appears to be related to the recently diagnosed thoracic compression fractures which appear acute to  subacute.  The patient has no associated neurologic deficits.  However, it also appears that the patient is at least slightly altered.  Differential includes deconditioning due to her decreased physical activity, dehydration, electrolyte abnormality, renal insufficiency, other metabolic etiology, UTI or other infection, or less likely cardiac cause.  We will obtain a lab work-up, chest x-ray, urinalysis, and reassess.  ----------------------------------------- 2:40 PM on 04/19/2020 -----------------------------------------  Lab work-up and chest x-ray are unremarkable.  I proceeded with a CT thoracic spine (which the patient is scheduled for) to further evaluate the recent fracture seen on MRI a few days ago.  This confirms T1 and T8 compression fractures.  I consulted Dr. Izora Ribas from neurosurgery who evaluated the patient.  He recommends a TLSO brace and admission for pain control as the patient's pain has been increasing while she is in the ED.  If her pain is unable to be controlled, she may need Ortho consultation in a few days.  I attempted to contact the patient's son but was unable to get in touch with him.  The RN talk  to him and he is on his way from [place].  ----------------------------------------- 3:09 PM on 04/19/2020 -----------------------------------------  I consulted Dr. Comer Locket from the hospitalist service for admission.  ____________________________________________   FINAL CLINICAL IMPRESSION(S) / ED DIAGNOSES  Final diagnoses:  Compression fracture of thoracic vertebra, unspecified thoracic vertebral level, initial encounter ([place])      NEW MEDICATIONS STARTED DURING THIS VISIT:  New Prescriptions   No medications on file     Note:  This document was prepared using Dragon voice recognition software and may include unintentional dictation errors.    Arta Silence, MD 04/19/20 [phone]

## 2020-04-19 NOTE — ED Notes (Signed)
Pt to be transported to floor at this time by Rosalita Chessman and Editor, commissioning.

## 2020-04-19 NOTE — ED Notes (Signed)
Friend[employer]Neighbor Catering manager - [phone]

## 2020-04-19 NOTE — Consult Note (Signed)
Referring Physician:  No referring provider defined for this encounter.  Primary Physician:  Adin Hector, MD  Chief Complaint:  Severe back pain  History of Present Illness: 04/19/2020 Alexandria Roman is a 87 y.o. female who presents with the chief complaint of severe back pain.  She fell on Saturday, and has had back pain since then. Her worst pain is near her bra line. She has no new neurological symptoms.    She has no other symptoms.  Review of Systems:  A 10 point review of systems is negative, except for the pertinent positives and negatives detailed in the HPI.  Past Medical History: Past Medical History:  Diagnosis Date   Cancer ([place])    Hypertension     Past Surgical History: Past Surgical History:  Procedure Laterality Date   ABDOMINAL HYSTERECTOMY     HIP ARTHROPLASTY Right 05/20/2018   Procedure: ARTHROPLASTY BIPOLAR HIP (HEMIARTHROPLASTY);  Surgeon: Thornton Park, MD;  Location: [facility] ORS;  Service: Orthopedics;  Laterality: Right;    Allergies: Allergies as of 04/19/2020 - Review Complete 04/19/2020  Allergen Reaction Noted   Amoxicillin-pot clavulanate Diarrhea 07/23/2013   Cephalexin Other (See Comments) and Nausea And Vomiting 07/23/2013   Hydrochlorothiazide Other (See Comments) and Nausea And Vomiting 07/23/2013   Hydrocodone-chlorpheniramine Other (See Comments) 07/23/2013   Hydrocodone-homatropine Other (See Comments) 07/23/2013   Metronidazole Other (See Comments)    Moxifloxacin Other (See Comments) 07/23/2013   Prednisone Nausea Only    Simvastatin Other (See Comments) 07/23/2013   Sulfa antibiotics Other (See Comments)    Sulfasalazine Other (See Comments)    Tramadol Other (See Comments) 07/23/2013   Celecoxib Nausea And Vomiting 07/23/2013   Codeine Nausea Only 03/17/2016   Cyclobenzaprine Other (See Comments) and Nausea Only 08/04/2015   Oxycodone-acetaminophen Nausea Only and Nausea And Vomiting 07/23/2013    Penicillin v potassium Rash 07/23/2013    Medications: No current facility-administered medications for this encounter.  Current Outpatient Medications:    bisacodyl (DULCOLAX) 10 MG suppository, Place 1 suppository (10 mg total) rectally daily as needed for moderate constipation., Disp: 12 suppository, Rfl: 0   Cholecalciferol (VITAMIN D3) 25 MCG (1000 UT) CAPS, Take 1 capsule (1,000 Units total) by mouth daily., Disp: 60 capsule, Rfl:    COMBIVENT RESPIMAT 20-100 MCG/ACT AERS respimat, Inhale 2 puffs into the lungs in the morning, at noon, and at bedtime., Disp: , Rfl:    docusate sodium (COLACE) 100 MG capsule, Take 100 mg by mouth as needed for constipation., Disp: , Rfl:    DULoxetine (CYMBALTA) 30 MG capsule, Take 1 capsule by mouth 2 (two) times daily., Disp: , Rfl:    fluticasone (FLONASE) 50 MCG/ACT nasal spray, Place 1 spray into both nostrils as needed., Disp: , Rfl:    gabapentin (NEURONTIN) 300 MG capsule, Take 2 capsules (600 mg total) by mouth 2 (two) times daily., Disp: , Rfl:    ipratropium (ATROVENT) 0.06 % nasal spray, Place 2 sprays into both nostrils 4 (four) times daily as needed for rhinitis., Disp: 15 mL, Rfl: 0   levothyroxine (SYNTHROID, LEVOTHROID) 100 MCG tablet, Take 1 tablet (100 mcg total) by mouth daily before breakfast., Disp: , Rfl:    LORazepam (ATIVAN) 0.5 MG tablet, Take 1 tablet (0.5 mg total) by mouth 2 (two) times daily., Disp: 10 tablet, Rfl: 0   meclizine (ANTIVERT) 25 MG tablet, Take 1 tablet (25 mg total) by mouth 3 (three) times daily as needed for dizziness., Disp: 30 tablet, Rfl: 0  Multiple Vitamins-Minerals (PRESERVISION/LUTEIN) CAPS, Take 1 capsule by mouth 2 (two) times daily., Disp: , Rfl:    ondansetron (ZOFRAN ODT) 4 MG disintegrating tablet, Take 1 tablet (4 mg total) by mouth every 8 (eight) hours as needed for nausea or vomiting., Disp: 20 tablet, Rfl: 0   Social History: Social History   Tobacco Use   Smoking status:  Never Smoker   Smokeless tobacco: Never Used  Scientific laboratory technician Use: Never used  Substance Use Topics   Alcohol use: No   Drug use: No    Family Medical History: Family History  Problem Relation Age of Onset   Testicular cancer Father     Physical Examination: Vitals:   04/19/20 1200 04/19/20 1300  BP: (!) 162/106 (!) 175/101  Pulse: 84 88  Resp: 12 19  Temp:    SpO2: 95% 97%     General: Patient is well developed, well nourished, calm, collected, and in no apparent distress.  Psychiatric: Patient is non-anxious.  Head:  Pupils equal, round, and reactive to light.  ENT:  Oral mucosa appears well hydrated.  Neck:   Supple.  Full range of motion.  Respiratory: Patient is breathing without any difficulty.  Extremities: No edema.  Vascular: Palpable pulses in dorsal pedal vessels.  Skin:   On exposed skin, there are no abnormal skin lesions.  NEUROLOGICAL:  General: In no acute distress.   Awake, alert, oriented to person, place, and time.  Pupils equal round and reactive to light.  Facial tone is symmetric.  Tongue protrusion is midline.  There is no pronator drift.  ROM of spine: diminished.  Palpation of spine: tender worst in mid t spine.    Strength: Side Biceps Triceps Deltoid Interossei Grip Wrist Ext. Wrist Flex.  R 5 5 5 5 5 5 5   L 5 5 5 5 5 5 5    Side Iliopsoas Quads Hamstring PF DF EHL  R 5 5 5 5 5 5   L 5 5 5 5 5 5    Reflexes are 1+ and symmetric at the biceps, triceps, brachioradialis, patella and achilles.   Bilateral upper and lower extremity sensation is intact to light touch.  Clonus is not present.  Toes are down-going.  Gait is untested due to pain. Hoffman's is absent.  Imaging: CT T spine 04/19/20  IMPRESSION: T1 and T8 compression fractures with less than 50% loss of height. Additional involvement of the left T1 lamina extending into the inferior facet without displacement.   Electronically Signed   By: Macy Mis  M.D.   On: 04/19/2020 11:41  MRI T spine 04/17/20 IMPRESSION: 1. Recent compression fracture at T1 with loss of height of 40%. Fracture line may extend all the way through the vertebral body. Consider CT for further characterization. Canal narrowing without clear cord compression. Foraminal narrowing left more than right. 2. Inferior endplate edema anteriorly at T7 which could relate to a Schmorl's node or a recent mild partial inferior compression fracture. 3. Acute or subacute fracture at T8 with maximal loss of height in the posterior central portion of 25%. No retropulsed bone. 4. Old healed augmented fracture at L1. 5. These results will be called to the ordering clinician or representative by the Radiologist Assistant, and communication documented in the PACS or [employer].   Electronically Signed   By: Nelson Chimes M.D.   On: 04/18/2020 11:52  I have personally reviewed the images and agree with the above interpretation.  Labs: CBC Latest  Ref Rng & Units 04/19/2020 01/28/2020 05/23/2018  WBC 4.0 - 10.5 K/uL 10.8(H) 7.0 10.3  Hemoglobin 12.0 - 15.0 g/dL 14.4 14.9 10.6(L)  Hematocrit 36.0 - 46.0 % 44.4 45.3 32.4(L)  Platelets 150 - 400 K/uL 329 317 232       Assessment and Plan: Ms. Reeg is a pleasant 87 y.o. female with severe back pain and new T1 and T8 compression fractures.  - brace with TLSO. No indication for surgery - Admit to medicine for pain control and PTOT - if pain does not improve over the next 36 hours, I will contact Dr. Rudene Christians about possible T8 kypho    Kaycee Mcgaugh K. Izora Ribas MD, [place] [facility]

## 2020-04-19 NOTE — ED Notes (Signed)
X Ray at bedside at this time.  

## 2020-04-19 NOTE — Evaluation (Signed)
Physical Therapy Evaluation Patient Details Name: Alexandria Roman MRN: [id number] DOB: 1929/03/11 Today's Date: 04/19/2020   History of Present Illness  Pt is an 87 y.o. female presenting to hospital 3/5 s/p fall on Saturday and has had severe back pain since (worset pain near bra line).  Imaging showing new T1 and T8 compression fx's.  Per neurosurgery no indication for surgery; recommending brace (TLSO).  PMH includes CA, htn, R hip hemiarthroplasty.  Clinical Impression  Prior to hospital admission, pt was ambulatory; lives alone (at [facility] facility).  TLSO already donned on pt upon PT arrival.  Currently pt is min assist with logrolling in bed.  Attempted to have pt progress from L sidelying to sitting multiple times but pt appearing very anxious and just before attempting to sit up pt would suddenly resist or roll back onto her back.  Unable to progress pt with max cueing, education, and encouragement (pt reporting being concerned regarding increased back pain or falling; pt reporting minimal back pain at rest beginning and end of session).  Pt also with difficulty following directions during session (pt appearing anxious and with difficulty focusing on cueing).  Pt would benefit from skilled PT to address noted impairments and functional limitations (see below for any additional details).  Upon hospital discharge, pt would benefit from STR.    Follow Up Recommendations SNF    Equipment Recommendations  Rolling walker with 5" wheels;3in1 (PT);Wheelchair (measurements PT);Wheelchair cushion (measurements PT)    Recommendations for Other Services OT consult     Precautions / Restrictions Precautions Precautions: Fall;Back Precaution Booklet Issued: No Precaution Comments: TLSO brace only when OOB; may remove brace to shower Required Braces or Orthoses: Spinal Brace Spinal Brace: Thoracolumbosacral orthotic Restrictions Weight Bearing Restrictions: No Other Position/Activity  Restrictions: Thoracic spinal fx's      Mobility  Bed Mobility Overal bed mobility: Needs Assistance Bed Mobility: Rolling Rolling: Min assist         General bed mobility comments: logrolling to R x1 trial and logrolling to L x4 trials    Transfers                 General transfer comment: unable to sit up to assess  Ambulation/Gait             General Gait Details: unable to sit up to assess  Stairs            Wheelchair Mobility    Modified Rankin (Stroke Patients Only)       Balance                                             Pertinent Vitals/Pain Pain Assessment: Faces Faces Pain Scale: Hurts little more Pain Location: mid/upper back Pain Descriptors / Indicators: Guarding;Tender Pain Intervention(s): Limited activity within patient's tolerance;Monitored during session;Premedicated before session;Repositioned  Vitals (HR and O2 on room air) stable and WFL throughout treatment session.    Home Living Family/patient expects to be discharged to:: Assisted living (Independent Living at [facility])               Home Equipment: Gilford Rile - 2 wheels;Cane - single point;Grab bars - tub/shower;Grab bars - toilet Additional Comments: Walk in shower; grab bar in shower and by toilet; shower seat built in; regular toilets    Prior Function Level of Independence: Independent  Comments: Lives along with dog "maggie".  Walks to dining room for meals once a day (sometimes use walker and sometimes nothing).  No other recent falls.     Hand Dominance   Dominant Hand: Right    Extremity/Trunk Assessment   Upper Extremity Assessment Upper Extremity Assessment: Generalized weakness    Lower Extremity Assessment Lower Extremity Assessment: Generalized weakness (difficult to assess d/t pt anxious with activity)    Cervical / Trunk Assessment Cervical / Trunk Assessment: Other exceptions (forward head/shoulders)   Communication   Communication: HOH  Cognition Arousal/Alertness: Awake/alert Behavior During Therapy: Anxious Overall Cognitive Status: Within Functional Limits for tasks assessed                                        General Comments   Nursing cleared pt for participation in physical therapy.  Pt agreeable to PT session.    Exercises  Education on spinal precautions and modifications with activity.   Assessment/Plan    PT Assessment Patient needs continued PT services  PT Problem List Decreased strength;Decreased activity tolerance;Decreased balance;Decreased mobility;Decreased knowledge of use of DME;Decreased knowledge of precautions;Pain       PT Treatment Interventions DME instruction;Gait training;Functional mobility training;Therapeutic activities;Therapeutic exercise;Balance training;Patient/family education    PT Goals (Current goals can be found in the Care Plan section)  Acute Rehab PT Goals Patient Stated Goal: to improve pain and mobility PT Goal Formulation: With patient Time For Goal Achievement: 05/03/20 Potential to Achieve Goals: Good    Frequency 7X/week   Barriers to discharge Decreased caregiver support      Co-evaluation               AM-PAC PT "6 Clicks" Mobility  Outcome Measure Help needed turning from your back to your side while in a flat bed without using bedrails?: A Little Help needed moving from lying on your back to sitting on the side of a flat bed without using bedrails?: A Lot Help needed moving to and from a bed to a chair (including a wheelchair)?: A Lot Help needed standing up from a chair using your arms (e.g., wheelchair or bedside chair)?: A Lot Help needed to walk in hospital room?: Total Help needed climbing 3-5 steps with a railing? : Total 6 Click Score: 11    End of Session Equipment Utilized During Treatment: Back brace (TLSO) Activity Tolerance: Patient limited by pain Patient left: in bed;with  call bell/phone within reach;Other (comment) (stretcher bed in lowest position with B railings in place) Nurse Communication: Mobility status;Precautions;Other (comment) (pt requesting food) PT Visit Diagnosis: Other abnormalities of gait and mobility (R26.89);Muscle weakness (generalized) (M62.81);History of falling (Z91.81);Difficulty in walking, not elsewhere classified (R26.2);Pain    Time: 3888-2800 PT Time Calculation (min) (ACUTE ONLY): 32 min   Charges:   PT Evaluation $PT Eval Low Complexity: 1 Low PT Treatments $Therapeutic Activity: 8-22 mins       Leitha Bleak, PT 04/19/20, 4:26 PM

## 2020-04-19 NOTE — ED Notes (Addendum)
TLSO brace applied at this time

## 2020-04-19 NOTE — ED Notes (Signed)
Pt given warm blanket and tv remote for comfort at this time. Call bell within reach, pt reminded to call out for needs as they arise. Pt verbalized understanding at this time.

## 2020-04-19 NOTE — ED Notes (Signed)
Pt given applesauce and cup of water per request at this time.

## 2020-04-20 DIAGNOSIS — S22060D Wedge compression fracture of T7-T8 vertebra, subsequent encounter for fracture with routine healing: Secondary | ICD-10-CM | POA: Diagnosis not present

## 2020-04-20 DIAGNOSIS — R531 Weakness: Secondary | ICD-10-CM | POA: Diagnosis not present

## 2020-04-20 DIAGNOSIS — E039 Hypothyroidism, unspecified: Secondary | ICD-10-CM

## 2020-04-20 DIAGNOSIS — G629 Polyneuropathy, unspecified: Secondary | ICD-10-CM

## 2020-04-20 DIAGNOSIS — S22010D Wedge compression fracture of first thoracic vertebra, subsequent encounter for fracture with routine healing: Secondary | ICD-10-CM

## 2020-04-20 DIAGNOSIS — H409 Unspecified glaucoma: Secondary | ICD-10-CM

## 2020-04-20 LAB — CBC
HCT: 43.3 % (ref 36.0–46.0)
Hemoglobin: 14.3 g/dL (ref 12.0–15.0)
MCH: 29.4 pg (ref 26.0–34.0)
MCHC: 33 g/dL (ref 30.0–36.0)
MCV: 89.1 fL (ref 80.0–100.0)
Platelets: 345 10*3/uL (ref 150–400)
RBC: 4.86 MIL/uL (ref 3.87–5.11)
RDW: 13.8 % (ref 11.5–15.5)
WBC: 8.2 10*3/uL (ref 4.0–10.5)
nRBC: 0 % (ref 0.0–0.2)

## 2020-04-20 LAB — COMPREHENSIVE METABOLIC PANEL
ALT: 9 U/L (ref 0–44)
AST: 14 U/L — ABNORMAL LOW (ref 15–41)
Albumin: 3.5 g/dL (ref 3.5–5.0)
Alkaline Phosphatase: 57 U/L (ref 38–126)
Anion gap: 8 (ref 5–15)
BUN: 18 mg/dL (ref 8–23)
CO2: 26 mmol/L (ref 22–32)
Calcium: 9.5 mg/dL (ref 8.9–10.3)
Chloride: 101 mmol/L (ref 98–111)
Creatinine, Ser: 0.9 mg/dL (ref 0.44–1.00)
GFR, Estimated: >60 mL/min (ref >60)
Glucose, Bld: 121 mg/dL — ABNORMAL HIGH (ref 70–99)
Potassium: 4.4 mmol/L (ref 3.5–5.1)
Sodium: 135 mmol/L (ref 135–145)
Total Bilirubin: 0.8 mg/dL (ref 0.3–1.2)
Total Protein: 7 g/dL (ref 6.5–8.1)

## 2020-04-20 MED ORDER — CALCITONIN (SALMON) 200 UNIT/ACT NA SOLN
1.0000 | Freq: Every day | NASAL | Status: DC
  Administered 2020-04-21: 1 via NASAL
  Filled 2020-04-20 (×2): qty 3.7

## 2020-04-20 MED ORDER — OXYCODONE HCL 5 MG PO TABS: 10.0000 mg | ORAL_TABLET | ORAL | Status: DC | PRN

## 2020-04-20 MED ORDER — LORAZEPAM 0.5 MG PO TABS
0.2500 mg | ORAL_TABLET | Freq: Two times a day (BID) | ORAL | Status: DC | PRN
  Administered 2020-04-21: 0.25 mg via ORAL
  Filled 2020-04-20: qty 1

## 2020-04-20 NOTE — Plan of Care (Signed)
Pt is alert and oriented x 3; forgetful. Complained of back pain with movement. Gabapentin given. Refused fentanyl IV. Bp elevated 187/11; Ouma NP informed with no orders. Not voided yet - bladder scan -362ml; Ouma NP informed and was instructed to bladder scan her again after 4-6 hrs. Will handover to day shift.

## 2020-04-20 NOTE — Progress Notes (Signed)
Patient ID: Alexandria Roman, female   DOB: [birth year], 87 y.o.   MRN: [id number] [facility] PROGRESS NOTE  Alexandria Roman [id number]:[id number] DOB: 12-Aug-1929 DOA: 04/19/2020 PCP: Adin Hector, MD  HPI/Subjective: Patient still having some pretty good back pain.  Hard to move around.  Nursing noted that she stopped breathing for few seconds while she was sleeping.  I was trying to talk to her about pain medications because she has 17 allergies listed in the computer.  Nursing staff also noted that she did not urinate into the pure wick and they will try to get her up to the commode.  Objective: Vitals:   04/20/20 0800 04/20/20 1149  BP: (!) 162/90 135/82  Pulse: 80 73  Resp: 14 18  Temp: 98 F (36.7 C) 97.8 F (36.6 C)  SpO2: 96% 92%    Filed Weights   04/19/20 0906  Weight: 74.8 kg    ROS: Review of Systems  Respiratory: Negative for cough and shortness of breath.   Cardiovascular: Negative for chest pain.  Gastrointestinal: Negative for abdominal pain, nausea and vomiting.  Musculoskeletal: Positive for back pain.   Exam: Physical Exam HENT:     Head: Normocephalic.     Mouth/Throat:     Pharynx: No oropharyngeal exudate.  Eyes:     General: Lids are normal.     Conjunctiva/sclera: Conjunctivae normal.     Pupils: Pupils are equal, round, and reactive to light.  Cardiovascular:     Rate and Rhythm: Normal rate and regular rhythm.     Heart sounds: Normal heart sounds, S1 normal and S2 normal.  Pulmonary:     Breath sounds: Normal breath sounds. No decreased breath sounds, wheezing, rhonchi or rales.  Abdominal:     Palpations: Abdomen is soft.     Tenderness: There is no abdominal tenderness.  Musculoskeletal:     Right lower leg: No swelling.     Left lower leg: No swelling.  Skin:    General: Skin is warm.     Findings: No rash.  Neurological:     Mental Status: She is alert and oriented to person, place, and time.     Comments: Able to straight leg  raise       Data Reviewed: Basic Metabolic Panel: Recent Labs  Lab 04/19/20 0856 04/20/20 0440  NA 135 135  K 3.9 4.4  CL 99 101  CO2 28 26  GLUCOSE 132* 121*  BUN 17 18  CREATININE 0.82 0.90  CALCIUM 9.4 9.5   Liver Function Tests: Recent Labs  Lab 04/19/20 0856 04/20/20 0440  AST 16 14*  ALT 10 9  ALKPHOS 61 57  BILITOT 0.9 0.8  PROT 7.2 7.0  ALBUMIN 3.7 3.5   CBC: Recent Labs  Lab 04/19/20 0856 04/20/20 0440  WBC 10.8* 8.2  NEUTROABS 8.5*  --   HGB 14.4 14.3  HCT 44.4 43.3  MCV 88.6 89.1  PLT 329 345   Cardiac Enzymes: Recent Labs  Lab 04/19/20 0856  CKTOTAL 34*     Recent Results (from the past 240 hour(s))  Resp Panel by RT-PCR (Flu A&B, Covid) Nasopharyngeal Swab     Status: None   Collection Time: 04/19/20  3:00 PM   Specimen: Nasopharyngeal Swab; Nasopharyngeal(NP) swabs in vial transport medium  Result Value Ref Range Status   SARS Coronavirus 2 by RT PCR NEGATIVE NEGATIVE Final    Comment: (NOTE) SARS-CoV-2 target nucleic acids are NOT DETECTED.  The SARS-CoV-2 RNA  is generally detectable in upper respiratory specimens during the acute phase of infection. The lowest concentration of SARS-CoV-2 viral copies this assay can detect is 138 copies/mL. A negative result does not preclude SARS-Cov-2 infection and should not be used as the sole basis for treatment or other patient management decisions. A negative result may occur with  improper specimen collection/handling, submission of specimen other than nasopharyngeal swab, presence of viral mutation(s) within the areas targeted by this assay, and inadequate number of viral copies(<138 copies/mL). A negative result must be combined with clinical observations, patient history, and epidemiological information. The expected result is Negative.  Fact Sheet for Patients:  EntrepreneurPulse.com.au  Fact Sheet for Healthcare Providers:   IncredibleEmployment.be  This test is no t yet approved or cleared by the [place] FDA and  has been authorized for detection and/or diagnosis of SARS-CoV-2 by FDA under an Emergency Use Authorization (EUA). This EUA will remain  in effect (meaning this test can be used) for the duration of the COVID-19 declaration under Section 564(b)(1) of the Act, 21 U.S.C.section 360bbb-3(b)(1), unless the authorization is terminated  or revoked sooner.       Influenza A by PCR NEGATIVE NEGATIVE Final   Influenza B by PCR NEGATIVE NEGATIVE Final    Comment: (NOTE) The Xpert Xpress SARS-CoV-2/FLU/RSV plus assay is intended as an aid in the diagnosis of influenza from Nasopharyngeal swab specimens and should not be used as a sole basis for treatment. Nasal washings and aspirates are unacceptable for Xpert Xpress SARS-CoV-2/FLU/RSV testing.  Fact Sheet for Patients: EntrepreneurPulse.com.au  Fact Sheet for Healthcare Providers: IncredibleEmployment.be  This test is not yet approved or cleared by the [place] FDA and has been authorized for detection and/or diagnosis of SARS-CoV-2 by FDA under an Emergency Use Authorization (EUA). This EUA will remain in effect (meaning this test can be used) for the duration of the COVID-19 declaration under Section 564(b)(1) of the Act, 21 U.S.C. section 360bbb-3(b)(1), unless the authorization is terminated or revoked.  Performed at [facility], [address] [place], [place] [address]      Studies: CT Head Wo Contrast  Result Date: 04/19/2020 CLINICAL DATA:  Mental status change. EXAM: CT HEAD WITHOUT CONTRAST TECHNIQUE: Contiguous axial images were obtained from the base of the skull through the vertex without intravenous contrast. COMPARISON:  March 19, 2016 FINDINGS: Brain: No subdural, epidural, or subarachnoid hemorrhage is. Ventricles and sulci are unremarkable. White  matter changes are stable. Cerebellum, brainstem, and basal cisterns are normal. No acute cortical ischemia or infarct. No mass effect or midline shift. Vascular: Calcified atherosclerosis in the intracranial carotids. Skull: Normal. Negative for fracture or focal lesion. Sinuses/Orbits: Mild mucosal thickening in the right maxillary and left frontal sinuses. Other: None. IMPRESSION: No acute intracranial abnormalities.  Chronic white matter changes. Electronically Signed   By: Dorise Bullion III M.D   On: 04/19/2020 11:31   CT Thoracic Spine Wo Contrast  Result Date: 04/19/2020 CLINICAL DATA:  Fall with compression fractures EXAM: CT THORACIC SPINE WITHOUT CONTRAST TECHNIQUE: Multidetector CT images of the thoracic were obtained using the standard protocol without intravenous contrast. COMPARISON:  MRI 04/17/2020 FINDINGS: Alignment: Accentuation of kyphosis. Mild osseous retropulsion at T1. Vertebrae: T1 compression fracture as discussed on recent MRI. Fracture line extends through superior and inferior endplates with approximately 40% loss of height. There is likely involvement of the posterior margin of the vertebral body where there is mild retropulsion. Additional fracture of the left T1 lamina extending into  the inferior facet without displacement. T8 compression fracture is also again identified with involvement of superior and inferior endplates. Posterior vertebral body margin appears spared. Height loss is approximately 40%. Thoracic vertebral body heights are otherwise maintained. Partially imaged chronic L1 compression fracture with kyphoplasty. Paraspinal and other soft tissues: Large hiatal hernia. Disc levels: Degenerative changes and spinal canal are better evaluated on the prior MRI. IMPRESSION: T1 and T8 compression fractures with less than 50% loss of height. Additional involvement of the left T1 lamina extending into the inferior facet without displacement. Electronically Signed   By:  Macy Mis M.D.   On: 04/19/2020 11:41   DG Chest Portable 1 View  Result Date: 04/19/2020 CLINICAL DATA:  87 year old female with intermittent hypoxia. EXAM: PORTABLE CHEST 1 VIEW COMPARISON:  05/13/2019 FINDINGS: The patient is rotated to the right. Poor inspiratory effort. The cardiomediastinal silhouette is unchanged given differences in technique. No focal consolidations, significant pleural effusions, or pneumothorax. Status post L1 vertebral cement augmentation, similar appearance. No acute osseous abnormality. IMPRESSION: Poor inspiratory effort limits evaluation. No acute cardiopulmonary process. Electronically Signed   By: Ruthann Cancer MD   On: 04/19/2020 10:08    Scheduled Meds: . calcitonin (salmon)  1 spray Alternating Nares Daily  . DULoxetine  30 mg Oral BID  . Ipratropium-Albuterol  2 puff Inhalation TID  . latanoprost  1 drop Both Eyes QHS  . levothyroxine  50 mcg Oral QAC breakfast  . metaxalone  400 mg Oral TID  . multivitamin-lutein  1 capsule Oral BID  . timolol  1 drop Both Eyes Daily    Assessment/Plan:  1. Thoracic compression fractures at T1 and T8.  Patient states they brace was very tight.  Discontinue IV pain medications and switch to oral as needed pain medications.  If pain does not improve can consider kyphoplasty.  We will give Miacalcin nasal spray. 2. Weakness.  Physical therapy recommends rehab 3. Neuropathy on gabapentin 4. Anxiety on Cymbalta and Ativan 5. Hypothyroidism unspecified on levothyroxine 6. Glaucoma on timolol and latanoprost 7. Nursing staff noted that the patient stopped breathing for quite a few seconds while sleeping.  Could have an underlying sleep apnea.  We will get an overnight oximetry.  Discontinue IV pain medications.   Code Status:     Code Status Orders  (From admission, onward)         Start     Ordered   04/19/20 1713  Full code  Continuous        04/19/20 1714        Code Status History    Date Active  Date Inactive Code Status Order ID Comments User Context   05/19/2018 2220 05/23/2018 1518 Full Code [id number]  Harrie Foreman, MD ED   03/19/2016 0544 03/20/2016 1942 Full Code [id number]  Saundra Shelling, MD ED   Advance Care Planning Activity     Family Communication: Spoke with son on the phone Disposition Plan: Status is: Inpatient  Dispo: The patient is from: Home              Anticipated d/c is to: Rehab              Patient currently being treated for compression fracture pain.  Will monitor pain to see if a kyphoplasty is needed.   Difficult to place patient.  Hopefully not.  Consultants:  Neurosurgery  Time spent: 28 minutes  [place]

## 2020-04-20 NOTE — Progress Notes (Signed)
During bedside report patient was in bed with eyes closed, noted to have rhytmic chest expansion with no air movement  X 15 seconds, awoke to touch and was A&O x3.  Denied sleep apnea history, none seen on H&P.  Loletha Grayer, MD notified.

## 2020-04-20 NOTE — Progress Notes (Signed)
Physical Therapy Treatment Patient Details Name: Alexandria Roman MRN: [id number] DOB: 1929-05-16 Today's Date: 04/20/2020    History of Present Illness Pt is an 87 y.o. female presenting to hospital 3/5 s/p fall on Saturday and has had severe back pain since (worset pain near bra line).  Imaging showing new T1 and T8 compression fx's.  Per neurosurgery no indication for surgery; recommending brace (TLSO).  PMH includes CA, htn, R hip hemiarthroplasty.    PT Comments    Nurse messaged therapist that pt needing to attempt voiding and pt agreeable to trying to get up to toilet; nurse reports pt received pain medication this morning.  TLSO donned in bed (pt requiring max assist to donn TLSO).  Min to mod assist for logrolling in bed and then to sit up on edge of bed; CGA for transfers with RW; and CGA to min assist to ambulate 20 feet and then 25 feet with RW.  Pt able to void some on toilet but mostly missed hat when urinating (NT and nurse notified).  Pt set up in recliner end of session with all needs in place.  Pt reporting being drowsy during session (d/t pain medication) requiring extra time and assist for activities for balance and safety.  Minimal back pain at rest beginning/end of session but pain increased to 4-8/10 with activity).   Will continue to focus on strengthening and progressive functional mobility per pt tolerance.    Follow Up Recommendations  SNF     Equipment Recommendations  Rolling walker with 5" wheels;3in1 (PT)    Recommendations for Other Services OT consult     Precautions / Restrictions Precautions Precautions: Fall;Back Precaution Booklet Issued: No Precaution Comments: TLSO brace only when OOB; may remove brace to shower Required Braces or Orthoses: Spinal Brace Spinal Brace: Thoracolumbosacral orthotic Restrictions Weight Bearing Restrictions: No Other Position/Activity Restrictions: Thoracic spinal fx's    Mobility  Bed Mobility Overal bed mobility:  Needs Assistance Bed Mobility: Rolling;Sidelying to Sit Rolling: Min assist (logrolling L and R in bed to donn TLSO) Sidelying to sit: Min assist;Mod assist (assist for trunk)       General bed mobility comments: vc's for logrolling technique/spinal precautions    Transfers Overall transfer level: Needs assistance Equipment used: Rolling walker (2 wheeled) Transfers: Sit to/from Stand Sit to Stand: Min guard         General transfer comment: mild increased effort to stand from bed and from toilet (use of g.b. from toilet); vc's for UE placement; vc's to line up to toilet/chair prior to sitting  Ambulation/Gait Ambulation/Gait assistance: Min guard;Min assist Gait Distance (Feet):  (20 feet; 25 feet) Assistive device: Rolling walker (2 wheeled)   Gait velocity: decreased   General Gait Details: decreased B LE step length; vc's and assist for safety and walker use   Stairs             Wheelchair Mobility    Modified Rankin (Stroke Patients Only)       Balance Overall balance assessment: Needs assistance Sitting-balance support: No upper extremity supported;Feet supported Sitting balance-Leahy Scale: Good Sitting balance - Comments: steady sitting reaching within BOS   Standing balance support: No upper extremity supported Standing balance-Leahy Scale: Good Standing balance comment: steady standing washing hands at sink                            Cognition Arousal/Alertness: Awake/alert Behavior During Therapy: Anxious Overall Cognitive Status: Within  Functional Limits for tasks assessed                                        Exercises      General Comments   Nursing cleared pt for participation in physical therapy.  Pt agreeable to PT session.      Pertinent Vitals/Pain Pain Assessment: Faces Faces Pain Scale: Hurts a little bit (2/10 with rest; 4-8/10 with activity) Pain Location: mid/upper back Pain Descriptors /  Indicators: Guarding;Tender Pain Intervention(s): Limited activity within patient's tolerance;Monitored during session;Premedicated before session;Repositioned  Vitals (HR and O2 on room air) stable and WFL throughout treatment session.    Home Living                      Prior Function            PT Goals (current goals can now be found in the care plan section) Acute Rehab PT Goals Patient Stated Goal: to improve pain and mobility PT Goal Formulation: With patient Time For Goal Achievement: 05/03/20 Potential to Achieve Goals: Good Progress towards PT goals: Progressing toward goals    Frequency    7X/week      PT Plan Current plan remains appropriate    Co-evaluation              AM-PAC PT "6 Clicks" Mobility   Outcome Measure  Help needed turning from your back to your side while in a flat bed without using bedrails?: A Little Help needed moving from lying on your back to sitting on the side of a flat bed without using bedrails?: A Lot Help needed moving to and from a bed to a chair (including a wheelchair)?: A Little Help needed standing up from a chair using your arms (e.g., wheelchair or bedside chair)?: A Little Help needed to walk in hospital room?: A Little Help needed climbing 3-5 steps with a railing? : A Lot 6 Click Score: 16    End of Session Equipment Utilized During Treatment: Back brace (TLSO donned in bed beginning of session) Activity Tolerance: Patient limited by pain;Other (comment) (Limited d/t drowsiness (pt reports from pain meds)) Patient left: in chair;with call bell/phone within reach;with chair alarm set;with SCD's reapplied;Other (comment) (TLSO donned in chair) Nurse Communication: Mobility status;Precautions;Other (comment) (pt able to void some but missed hat in toilet to measure) PT Visit Diagnosis: Other abnormalities of gait and mobility (R26.89);Muscle weakness (generalized) (M62.81);History of falling  (Z91.81);Difficulty in walking, not elsewhere classified (R26.2);Pain     Time: 0355-9741 PT Time Calculation (min) (ACUTE ONLY): 39 min  Charges:  $Therapeutic Activity: 38-52 mins                     Leitha Bleak, PT 04/20/20, 11:36 AM

## 2020-04-20 NOTE — Progress Notes (Signed)
Overnight oximetry initiated on rm air.  Current O2 sat is 93%.

## 2020-04-20 NOTE — Evaluation (Signed)
Occupational Therapy Evaluation Patient Details Name: NANDA BITTICK MRN: [id number] DOB: 09/21/1929 Today's Date: 04/20/2020    History of Present Illness Pt is an 87 y.o. female presenting to hospital 3/5 s/p fall on Saturday and has had severe back pain since (worset pain near bra line).  Imaging showing new T1 and T8 compression fx's.  Per neurosurgery no indication for surgery; recommending brace (TLSO).  PMH includes CA, htn, R hip hemiarthroplasty.   Clinical Impression   Pt seen for OT evaluation this date. Upon arrival to room, pt seated upright in chair following PT session. Pt initially stated that she did not want to do therapy d/t 8/10 back pain and complaining that she "feels dirty". Pt educated on role of OT, with pt subsequently agreeable to evaluation and treatment. Prior to admission, pt was living with her dog in an independent living community and reports being independent with ADLs and functional mobility. Pt reports receiving assistance from independent living community for meals and home maintenance. Pt currently requires MAX A to don TLSO, SET-UP/SUPERVISION to don hospital gown, and SET-UP/SUPERVISION for seated grooming tasks due to current functional impairments (See OT Problem List below). Pt complained of decreased short term memory since fall and was able to recall 2/3 spinal precautions at end of session following 2x re-education attempts. Pt would benefit from additional skilled OT services to improve safety awareness and maximize recall/carryover of spinal precautions and facilitate implementation of learned techniques into daily routines. Upon discharge, recommend SNF.    Follow Up Recommendations  SNF    Equipment Recommendations  Other (comment) (defer to next venue of care)       Precautions / Restrictions Precautions Precautions: Fall;Back Precaution Booklet Issued: No Precaution Comments: TLSO brace only when OOB; may remove brace to shower Required  Braces or Orthoses: Spinal Brace Spinal Brace: Thoracolumbosacral orthotic Restrictions Weight Bearing Restrictions: No Other Position/Activity Restrictions: Thoracic spinal fx's      Mobility Bed Mobility       General bed mobility comments: Upon arrival to room, pt seated upright in chair    Transfers Overall transfer level: Needs assistance Equipment used: Rolling walker (2 wheeled) Transfers: Sit to/from Stand Sit to Stand: Min guard         General transfer comment: Increased time and effort    Balance Overall balance assessment: Needs assistance Sitting-balance support: No upper extremity supported;Feet supported Sitting balance-Leahy Scale: Good Sitting balance - Comments: steady sitting reaching within BOS                             ADL either performed or assessed with clinical judgement   ADL Overall ADL's : Needs assistance/impaired                                       General ADL Comments: While seated, MAX A to don TLSO, SET-UP/SUPERVISION to don hospital gown, and SET-UP/SUPERVISION to wash/dry face, apply deodorant, and brush hair. Pt able to demo figure-4 position independently however deferred donning/doffing socks this date.                  Pertinent Vitals/Pain Pain Assessment: 0-10 Pain Score: 8  Faces Pain Scale: Hurts a little bit (2/10 with rest; 4-8/10 with activity) Pain Location: mid/upper back Pain Descriptors / Indicators: Guarding;Tender Pain Intervention(s): Limited activity within patient's tolerance;Monitored during  session;Premedicated before session;Repositioned     Hand Dominance Right   Extremity/Trunk Assessment Upper Extremity Assessment Upper Extremity Assessment: Generalized weakness   Lower Extremity Assessment Lower Extremity Assessment: Defer to PT evaluation       Communication Communication Communication: No difficulties   Cognition Arousal/Alertness: Awake/alert Behavior  During Therapy: WFL for tasks assessed/performed Overall Cognitive Status: Impaired/Different from baseline                                 General Comments: Pt c/o of decreased short term memory since fall. Pt able to recall 2/3 spinal precautions at end of session following 2x re-education attempts.      Exercises Other Exercises Other Exercises: Pt instructed in back precautions and how to maintain during ADL and IADL tasks        Home Living Family/patient expects to be discharged to:: Assisted living (Independent Living at [facility])                             Home Equipment: Gilford Rile - 2 wheels;Cane - single point;Grab bars - tub/shower;Grab bars - toilet   Additional Comments: Walk in shower; grab bar in shower and by toilet; shower seat built in; regular toilets      Prior Functioning/Environment Level of Independence: Independent        Comments: Pt reports being independent with ADLs. Pt walks to dining room for meals once a day (sometimes use walker and sometimes nothing) and is able to prep simple meals/snacks. Pt receives biweekly assistance from independent living staff for cleaning home. No other recent falls. Lives along with dog "maggie", who son is caring for now (son lives 4 hours away in [place])        OT Problem List: Decreased strength;Decreased activity tolerance;Impaired balance (sitting and/or standing);Decreased cognition;Decreased knowledge of precautions      OT Treatment/Interventions: Self-care/ADL training;Therapeutic exercise;Energy conservation;DME and/or AE instruction;Therapeutic activities;Patient/family education;Cognitive remediation/compensation;Balance training    OT Goals(Current goals can be found in the care plan section) Acute Rehab OT Goals Patient Stated Goal: to improve pain OT Goal Formulation: With patient Time For Goal Achievement: 05/04/20 Potential to Achieve Goals: Good ADL Goals Pt Will Perform  Grooming: with modified independence;standing Pt Will Transfer to Toilet: with modified independence;ambulating;regular height toilet Pt Will Perform Toileting - Clothing Manipulation and hygiene: with modified independence;sit to/from stand  OT Frequency: Min 2X/week    AM-PAC OT "6 Clicks" Daily Activity     Outcome Measure Help from another person eating meals?: None Help from another person taking care of personal grooming?: A Little Help from another person toileting, which includes using toliet, bedpan, or urinal?: A Lot Help from another person bathing (including washing, rinsing, drying)?: A Lot Help from another person to put on and taking off regular upper body clothing?: A Little Help from another person to put on and taking off regular lower body clothing?: A Little 6 Click Score: 17   End of Session Equipment Utilized During Treatment: Back brace Nurse Communication: Mobility status  Activity Tolerance: Patient tolerated treatment well Patient left: in chair;with call bell/phone within reach;with chair alarm set  OT Visit Diagnosis: Unsteadiness on feet (R26.81);Muscle weakness (generalized) (M62.81);Pain Pain - Right/Left:  (back) Pain - part of body:  (back)                Time: 4008-6761 OT Time Calculation (  min): 35 min Charges:  OT General Charges $OT Visit: 1 Visit OT Evaluation $OT Eval Moderate Complexity: 1 Mod OT Treatments $Self Care/Home Management : 23-37 mins  Fredirick Maudlin, OTR/L [place]

## 2020-04-21 DIAGNOSIS — S22080D Wedge compression fracture of T11-T12 vertebra, subsequent encounter for fracture with routine healing: Secondary | ICD-10-CM | POA: Diagnosis not present

## 2020-04-21 DIAGNOSIS — S22010D Wedge compression fracture of first thoracic vertebra, subsequent encounter for fracture with routine healing: Secondary | ICD-10-CM | POA: Diagnosis not present

## 2020-04-21 DIAGNOSIS — G4734 Idiopathic sleep related nonobstructive alveolar hypoventilation: Secondary | ICD-10-CM | POA: Diagnosis not present

## 2020-04-21 DIAGNOSIS — R531 Weakness: Secondary | ICD-10-CM | POA: Diagnosis not present

## 2020-04-21 MED ORDER — OXYCODONE HCL 5 MG PO TABS
5.0000 mg | ORAL_TABLET | ORAL | Status: DC | PRN
Start: 2020-04-21 — End: 2020-04-21
  Administered 2020-04-21: 5 mg via ORAL
  Filled 2020-04-21: qty 1

## 2020-04-21 MED ORDER — GABAPENTIN 400 MG PO CAPS: 400.0000 mg | ORAL_CAPSULE | Freq: Two times a day (BID) | ORAL | Status: DC | PRN

## 2020-04-21 MED ORDER — OXYCODONE HCL 5 MG PO TABS
2.5000 mg | ORAL_TABLET | ORAL | Status: DC | PRN
  Administered 2020-04-22 (×2): 2.5 mg via ORAL
  Filled 2020-04-21 (×3): qty 1

## 2020-04-21 NOTE — Progress Notes (Signed)
PT Cancellation Note  Patient Details Name: Alexandria Roman MRN: [id number] DOB: October 28, 1929   Cancelled Treatment:    Reason Eval/Treat Not Completed: Other (comment).  Pt resting in bed upon PT arrival and reporting being dizzy and having too much pain in back with any movement to participate in therapy (pt refused getting to recliner and even LE ex's in bed).  Nurse notified of this and pt's symptoms.  Will re-attempt PT session at a later date/time.  Leitha Bleak, PT 04/21/20, 10:08 AM

## 2020-04-21 NOTE — Progress Notes (Addendum)
PT Cancellation Note  Patient Details Name: Alexandria Roman MRN: [id number] DOB: June 12, 1929   Cancelled Treatment:    Reason Eval/Treat Not Completed: Other (comment).  Pt resting in bed upon PT arrival.  Pt declining physical therapy: pt stating "I don't feel like it".  Therapist asked pt what was going on and pt eventually reporting d/t being tired and d/t back pain (although pt would not rate pain when asked): nurse notified.  Will re-attempt PT treatment session at a later date/time.  Leitha Bleak, PT 04/21/20, 1:33 PM    Addendum: Per secure message with MD Izora Ribas pt can donn TLSO in bed or sitting edge of bed.  Leitha Bleak, PT 04/21/20, 5:46 PM

## 2020-04-21 NOTE — Progress Notes (Signed)
Patient ID: Alexandria Roman, female   DOB: [birth year], 87 y.o.   MRN: [id number] [facility] PROGRESS NOTE  [name] CHE:[id number] DOB: 1929/06/14 DOA: 04/19/2020 PCP: Adin Hector, MD  HPI/Subjective: Patient still having pain.  Patient states that she is having trouble thinking about things.  Very nervous about things.  Needed my help just to turn over in the bed for me to palpate her spine.  Objective: Vitals:   04/21/20 0534 04/21/20 0900  BP: (!) 181/92 (!) 156/91  Pulse: 79 79  Resp: 18 16  Temp: (!) 97.5 F (36.4 C) (!) 97.4 F (36.3 C)  SpO2: 94% 96%    Intake/Output Summary (Last 24 hours) at 04/21/2020 1242 Last data filed at 04/21/2020 0500 Gross per 24 hour  Intake -  Output 200 ml  Net -200 ml   Filed Weights   04/19/20 0906  Weight: 74.8 kg    ROS: Review of Systems  Respiratory: Negative for shortness of breath.   Cardiovascular: Negative for chest pain.  Gastrointestinal: Negative for abdominal pain, nausea and vomiting.   Exam: Physical Exam HENT:     Head: Normocephalic.     Mouth/Throat:     Pharynx: No oropharyngeal exudate.  Eyes:     General: Lids are normal.     Conjunctiva/sclera: Conjunctivae normal.  Cardiovascular:     Rate and Rhythm: Normal rate and regular rhythm.     Heart sounds: Normal heart sounds, S1 normal and S2 normal.  Pulmonary:     Breath sounds: Examination of the right-lower field reveals decreased breath sounds. Examination of the left-lower field reveals decreased breath sounds. Decreased breath sounds present. No wheezing, rhonchi or rales.  Abdominal:     Palpations: Abdomen is soft.     Tenderness: There is no abdominal tenderness.  Musculoskeletal:     Right ankle: No swelling.     Left ankle: No swelling.  Skin:    General: Skin is warm.     Findings: No rash.  Neurological:     Mental Status: She is alert.     Comments: Answers some questions but difficult to focus.       Data  Reviewed: Basic Metabolic Panel: Recent Labs  Lab 04/19/20 0856 04/20/20 0440  NA 135 135  K 3.9 4.4  CL 99 101  CO2 28 26  GLUCOSE 132* 121*  BUN 17 18  CREATININE 0.82 0.90  CALCIUM 9.4 9.5   Liver Function Tests: Recent Labs  Lab 04/19/20 0856 04/20/20 0440  AST 16 14*  ALT 10 9  ALKPHOS 61 57  BILITOT 0.9 0.8  PROT 7.2 7.0  ALBUMIN 3.7 3.5   CBC: Recent Labs  Lab 04/19/20 0856 04/20/20 0440  WBC 10.8* 8.2  NEUTROABS 8.5*  --   HGB 14.4 14.3  HCT 44.4 43.3  MCV 88.6 89.1  PLT 329 345   Cardiac Enzymes: Recent Labs  Lab 04/19/20 0856  CKTOTAL 34*     Recent Results (from the past 240 hour(s))  Resp Panel by RT-PCR (Flu A&B, Covid) Nasopharyngeal Swab     Status: None   Collection Time: 04/19/20  3:00 PM   Specimen: Nasopharyngeal Swab; Nasopharyngeal(NP) swabs in vial transport medium  Result Value Ref Range Status   SARS Coronavirus 2 by RT PCR NEGATIVE NEGATIVE Final    Comment: (NOTE) SARS-CoV-2 target nucleic acids are NOT DETECTED.  The SARS-CoV-2 RNA is generally detectable in upper respiratory specimens during the acute phase of infection.  The lowest concentration of SARS-CoV-2 viral copies this assay can detect is 138 copies/mL. A negative result does not preclude SARS-Cov-2 infection and should not be used as the sole basis for treatment or other patient management decisions. A negative result may occur with  improper specimen collection/handling, submission of specimen other than nasopharyngeal swab, presence of viral mutation(s) within the areas targeted by this assay, and inadequate number of viral copies(<138 copies/mL). A negative result must be combined with clinical observations, patient history, and epidemiological information. The expected result is Negative.  Fact Sheet for Patients:  EntrepreneurPulse.com.au  Fact Sheet for Healthcare Providers:  IncredibleEmployment.be  This test is no  t yet approved or cleared by the [place] FDA and  has been authorized for detection and/or diagnosis of SARS-CoV-2 by FDA under an Emergency Use Authorization (EUA). This EUA will remain  in effect (meaning this test can be used) for the duration of the COVID-19 declaration under Section 564(b)(1) of the Act, 21 U.S.C.section 360bbb-3(b)(1), unless the authorization is terminated  or revoked sooner.       Influenza A by PCR NEGATIVE NEGATIVE Final   Influenza B by PCR NEGATIVE NEGATIVE Final    Comment: (NOTE) The Xpert Xpress SARS-CoV-2/FLU/RSV plus assay is intended as an aid in the diagnosis of influenza from Nasopharyngeal swab specimens and should not be used as a sole basis for treatment. Nasal washings and aspirates are unacceptable for Xpert Xpress SARS-CoV-2/FLU/RSV testing.  Fact Sheet for Patients: EntrepreneurPulse.com.au  Fact Sheet for Healthcare Providers: IncredibleEmployment.be  This test is not yet approved or cleared by the [place] FDA and has been authorized for detection and/or diagnosis of SARS-CoV-2 by FDA under an Emergency Use Authorization (EUA). This EUA will remain in effect (meaning this test can be used) for the duration of the COVID-19 declaration under Section 564(b)(1) of the Act, 21 U.S.C. section 360bbb-3(b)(1), unless the authorization is terminated or revoked.  Performed at [facility], [address] [place], [place] [address]       Scheduled Meds: . calcitonin (salmon)  1 spray Alternating Nares Daily  . DULoxetine  30 mg Oral BID  . Ipratropium-Albuterol  2 puff Inhalation TID  . latanoprost  1 drop Both Eyes QHS  . levothyroxine  50 mcg Oral QAC breakfast  . metaxalone  400 mg Oral TID  . multivitamin-lutein  1 capsule Oral BID  . timolol  1 drop Both Eyes Daily    Assessment/Plan:  1. Thoracic compression fracture at T1 and T8.  Patient having trouble with the brace  and said it was too tight.  Decrease oral pain medications again today.  Spoke with Dr. Rudene Christians about the potential of a kyphoplasty.  Spoke with Dr. Lacinda Axon about the T1 compression fracture and he will speak with Dr. Cari Caraway.  Patient may end up deciding on conservative management. 2. Nocturnal hypoxia.  Qualifies for oxygen at night with overnight oximetry.  Will start 2 L oxygen at night. 3. Weakness.  Physical therapy recommends rehab 4. Neuropathy on as needed gabapentin will decrease the dose 5. Anxiety on Cymbalta and Ativan 6. Hypothyroidism unspecified on levothyroxine 7. Glaucoma on timolol and latanoprost 8. Elevated blood pressure readings likely secondary to pain.        Code Status:     Code Status Orders  (From admission, onward)         Start     Ordered   04/19/20 1713  Full code  Continuous  04/19/20 1714        Code Status History    Date Active Date Inactive Code Status Order ID Comments User Context   05/19/2018 2220 05/23/2018 1518 Full Code [id number]  Harrie Foreman, MD ED   03/19/2016 0544 03/20/2016 1942 Full Code [id number]  Saundra Shelling, MD ED   Advance Care Planning Activity     Family Communication: Spoke with son on the phone Disposition Plan: Status is: Inpatient  Dispo: The patient is from: Home              Anticipated d/c is to: Rehab              Patient currently being treated for pain for compression fractures.  Dr. Rudene Christians will speak with her again tomorrow about kyphoplasty.   Difficult to place patient.  Hopefully not.  Spoke with transitional care team to look into rehab today.  Time spent: 28 minutes, case discussed with Dr. Rudene Christians orthopedic surgery and neurosurgery Dr. Lacinda Axon.  [place]  [facility] [employer]

## 2020-04-21 NOTE — TOC Initial Note (Signed)
Transition of Care [facility]) - Initial/Assessment Note    Patient Details  Name: Alexandria Roman MRN: [id number] Date of Birth: [birth year]  Transition of Care [facility]) CM/SW Contact:    Beverly Sessions, RN Phone Number: 04/21/2020, 4:14 PM  Clinical Narrative:                 Patient admitted for compression fracture Patient states that she lives at home alone at [facility] apartment.   PCP Caryl Comes.   Patient currently with some confusion.  She request that I speak with her son Reached out to her son via phone.  States that he lives in [place]. He confirms that patient lives alone in independent living.  Patient for potential kyphoplasty   Son states that typically patient's friend Joneen Boers provides transportation to appointments.  Up until the last few weeks patient was able to drive her self short distances.   PT has assessed patient and recommends SNF Son in agreement PASRR obtained  [place] sent for signature Bed search initaited    Expected Discharge Plan: Skilled Nursing Facility Barriers to Discharge: Continued Medical Work up   Patient Goals and CMS Choice        Expected Discharge Plan and Services Expected Discharge Plan: [place]       Living arrangements for the past 2 months: Apartment                                      Prior Living Arrangements/Services Living arrangements for the past 2 months: Apartment Lives with:: Self Patient language and need for interpreter reviewed:: Yes            Current home services: DME    Activities of Daily Living Home Assistive Devices/Equipment: None ADL Screening (condition at time of admission) Patient's cognitive ability adequate to safely complete daily activities?: Yes Is the patient deaf or have difficulty hearing?: No Does the patient have difficulty seeing, even when wearing glasses/contacts?: No Does the patient have difficulty concentrating, remembering, or making decisions?:  Yes Patient able to express need for assistance with ADLs?: Yes Does the patient have difficulty dressing or bathing?: Yes Independently performs ADLs?: No Communication: Independent Dressing (OT): Needs assistance Is this a change from baseline?: Change from baseline, expected to last >3 days Grooming: Needs assistance Is this a change from baseline?: Change from baseline, expected to last >3 days Feeding: Independent Bathing: Needs assistance Is this a change from baseline?: Change from baseline, expected to last >3 days Toileting: Needs assistance Is this a change from baseline?: Change from baseline, expected to last >3days In/Out Bed: Needs assistance Is this a change from baseline?: Change from baseline, expected to last >3 days Walks in Home: Needs assistance Is this a change from baseline?: Change from baseline, expected to last >3 days Does the patient have difficulty walking or climbing stairs?: Yes Weakness of Legs: Both Weakness of Arms/Hands: None  Permission Sought/Granted                  Emotional Assessment       Orientation: : Fluctuating Orientation (Suspected and/or reported Sundowners) Alcohol / Substance Use: Not Applicable Psych Involvement: No (comment)  Admission diagnosis:  Thoracic compression fracture ([place]) [S22.000A] Compression fracture of thoracic vertebra, unspecified thoracic vertebral level, initial encounter ([place]) [S22.000A] Patient Active Problem List   Diagnosis Date Noted  . Nocturnal hypoxia   .  Weakness   . Neuropathy   . Glaucoma of both eyes   . Thoracic compression fracture ([place]) 04/19/2020  . Hip fracture ([place]) 05/19/2018  . Depression with anxiety 12/09/2016  . Numbness of foot 12/09/2016  . Macular degeneration 12/09/2016  . Gait instability 12/08/2016  . Upper respiratory infection, viral 12/06/2016  . Primary osteoarthritis of both knees 07/20/2016  . Postmenopausal osteoporosis 04/15/2016  . Chest pain 03/19/2016   . Spinal stenosis 08/04/2015  . Myalgia 06/11/2015  . Polyarthralgia 06/11/2015  . Chronic midline low back pain with bilateral sciatica 01/10/2015  . Benign essential hypertension 06/04/2014  . CKD (chronic kidney disease) stage 3, GFR 30-59 ml/min (HCC) 06/04/2014  . Mixed hyperlipidemia 06/04/2014  . Other allergic rhinitis 06/04/2014  . DDD (degenerative disc disease), lumbar 09/07/2013  . Lumbar radiculitis 09/07/2013  . Hypothyroidism, unspecified 08/02/2013   PCP:  Adin Hector, MD Pharmacy:   [facility] [address]), [place] [address] [place]) [place] [address] Phone: [phone] Fax: [place], [place] - [address] [place] [address] Phone: [phone] Fax: [phone]  [facility] [place], [place] - [place] [facility] [address] [place] [facility] [place] [address] Phone: [phone] Fax: [phone]     Social Determinants of Health (SDOH) Interventions    Readmission Risk Interventions No flowsheet data found.

## 2020-04-21 NOTE — NC FL2 (Signed)
[place] LEVEL OF CARE SCREENING TOOL     IDENTIFICATION  Patient Name: Alexandria Roman Birthdate: 16-Feb-1930 Sex: female Admission Date (Current Location): 04/19/2020  [facility] and [place] Number:  Engineering geologist and Address:         Provider Number: [id number]  Attending Physician Name and Address:  Loletha Grayer, MD  Relative Name and Phone Number:       Current Level of Care: Hospital Recommended Level of Care: [place] Prior Approval Number:    Date Approved/Denied:   PASRR Number: [id number]  Discharge Plan: SNF    Current Diagnoses: Patient Active Problem List   Diagnosis Date Noted  . Nocturnal hypoxia   . Weakness   . Neuropathy   . Glaucoma of both eyes   . Thoracic compression fracture ([place]) 04/19/2020  . Hip fracture ([facility]) 05/19/2018  . Depression with anxiety 12/09/2016  . Numbness of foot 12/09/2016  . Macular degeneration 12/09/2016  . Gait instability 12/08/2016  . Upper respiratory infection, viral 12/06/2016  . Primary osteoarthritis of both knees 07/20/2016  . Postmenopausal osteoporosis 04/15/2016  . Chest pain 03/19/2016  . Spinal stenosis 08/04/2015  . Myalgia 06/11/2015  . Polyarthralgia 06/11/2015  . Chronic midline low back pain with bilateral sciatica 01/10/2015  . Benign essential hypertension 06/04/2014  . CKD (chronic kidney disease) stage 3, GFR 30-59 ml/min (HCC) 06/04/2014  . Mixed hyperlipidemia 06/04/2014  . Other allergic rhinitis 06/04/2014  . DDD (degenerative disc disease), lumbar 09/07/2013  . Lumbar radiculitis 09/07/2013  . Hypothyroidism, unspecified 08/02/2013    Orientation RESPIRATION BLADDER Height & Weight     Self,Time,Situation,Place  Normal Continent Weight: 74.8 kg Height:  5\' 2"  (157.5 cm)  BEHAVIORAL SYMPTOMS/MOOD NEUROLOGICAL BOWEL NUTRITION STATUS      Continent Diet (Heart Healthy)  AMBULATORY STATUS COMMUNICATION OF NEEDS Skin   Limited Assist Verbally  Normal                       Personal Care Assistance Level of Assistance              Functional Limitations Info             SPECIAL CARE FACTORS FREQUENCY  PT (By licensed PT),OT (By licensed OT)                    Contractures Contractures Info: Not present    Additional Factors Info  Code Status,Allergies Code Status Info: Full Allergies Info: Amoxicillin-pot Clavulanate, Cephalexin, Hydrochlorothiazide, Hydrocodone-chlorpheniramine, Hydrocodone-homatropine, Metronidazole, Moxifloxacin, Prednisone, Simvastatin, Sulfa Antibiotics, Sulfasalazine, Tramadol, Celecoxib, Codeine, Cyclobenzaprine, Oxycodone-acetaminophen, Penicillin V Potassium           Current Medications (04/21/2020):  This is the current hospital active medication list Current Facility-Administered Medications  Medication Dose Route Frequency Provider Last Rate Last Admin  . acetaminophen (TYLENOL) tablet 650 mg  650 mg Oral Q6H PRN Clarnce Flock, MD   650 mg at 04/19/20 1849   Or  . acetaminophen (TYLENOL) suppository 650 mg  650 mg Rectal Q6H PRN Clarnce Flock, MD      . calcitonin (salmon) (MIACALCIN/FORTICAL) nasal spray 1 spray  1 spray Alternating Nares Daily Loletha Grayer, MD   1 spray at 04/21/20 1423  . DULoxetine (CYMBALTA) DR capsule 30 mg  30 mg Oral BID Clarnce Flock, MD   30 mg at 04/21/20 0901  . gabapentin (NEURONTIN) capsule 400 mg  400 mg Oral BID PRN Loletha Grayer,  MD      . hydrOXYzine (ATARAX/VISTARIL) tablet 25 mg  25 mg Oral TID PRN Clarnce Flock, MD   25 mg at 04/19/20 1849  . Ipratropium-Albuterol (COMBIVENT) respimat 2 puff  2 puff Inhalation TID Clarnce Flock, MD   2 puff at 04/21/20 1555  . latanoprost (XALATAN) 0.005 % ophthalmic solution 1 drop  1 drop Both Eyes QHS Clarnce Flock, MD   1 drop at 04/20/20 2057  . levothyroxine (SYNTHROID) tablet 50 mcg  50 mcg Oral QAC breakfast Clarnce Flock, MD   50 mcg at 04/21/20 0525  .  LORazepam (ATIVAN) tablet 0.25 mg  0.25 mg Oral BID PRN Loletha Grayer, MD   0.25 mg at 04/21/20 1421  . metaxalone (SKELAXIN) tablet 400 mg  400 mg Oral TID Clarnce Flock, MD   400 mg at 04/21/20 1553  . multivitamin-lutein (OCUVITE-LUTEIN) capsule 1 capsule  1 capsule Oral BID Clarnce Flock, MD   1 capsule at 04/21/20 0901  . ondansetron (ZOFRAN) tablet 4 mg  4 mg Oral Q6H PRN Clarnce Flock, MD   4 mg at 04/20/20 1311   Or  . ondansetron [facility]) injection 4 mg  4 mg Intravenous Q6H PRN Clarnce Flock, MD   4 mg at 04/19/20 1739  . oxyCODONE (Oxy IR/ROXICODONE) immediate release tablet 2.5 mg  2.5 mg Oral Q4H PRN Wieting, Richard, MD      . polyethylene glycol (MIRALAX / GLYCOLAX) packet 17 g  17 g Oral Daily PRN Clarnce Flock, MD   17 g at 04/21/20 0901  . timolol (TIMOPTIC) 0.5 % ophthalmic solution 1 drop  1 drop Both Eyes Daily Clarnce Flock, MD   1 drop at 04/21/20 0909  . traZODone (DESYREL) tablet 50 mg  50 mg Oral QHS PRN Clarnce Flock, MD         Discharge Medications: Please see discharge summary for a list of discharge medications.  Relevant Imaging Results:  Relevant Lab Results:   Additional Information ss [id number]  Beverly Sessions, RN

## 2020-04-21 NOTE — Consult Note (Signed)
Reason for Consult: T8 compression fracture Referring Physician: Dr. Georgana Curio is an 87 y.o. female.  HPI: Patient suffered a fall approximately 5 days ago at home.  She came to the emergency room on the fifth and was admitted and found to have T1 and T8 compression fractures.  She is having quite a bit of pain from the T8 fracture.  She has a history of prior L1 kyphoplasty Derm.  She lives in independent living facility here in [place].  She seems somewhat confused this morning and has difficulty concentrating and answering questions.  Past Medical History:  Diagnosis Date  . Cancer ([place])   . Hypertension     Past Surgical History:  Procedure Laterality Date  . ABDOMINAL HYSTERECTOMY    . HIP ARTHROPLASTY Right 05/20/2018   Procedure: ARTHROPLASTY BIPOLAR HIP (HEMIARTHROPLASTY);  Surgeon: Thornton Park, MD;  Location: [facility] ORS;  Service: Orthopedics;  Laterality: Right;    Family History  Problem Relation Age of Onset  . Testicular cancer Father     Social History:  reports that she has never smoked. She has never used smokeless tobacco. She reports that she does not drink alcohol and does not use drugs.  Allergies:  Allergies  Allergen Reactions  . Amoxicillin-Pot Clavulanate Diarrhea  . Cephalexin Other (See Comments) and Nausea And Vomiting  . Hydrochlorothiazide Other (See Comments) and Nausea And Vomiting  . Hydrocodone-Chlorpheniramine Other (See Comments)  . Hydrocodone-Homatropine Other (See Comments)  . Metronidazole Other (See Comments)    Other reaction(s): Other (See Comments) Other Reaction: Not Assessed Other Reaction: Not Assessed  . Moxifloxacin Other (See Comments)  . Prednisone Nausea Only  . Simvastatin Other (See Comments)  . Sulfa Antibiotics Other (See Comments)    Other reaction(s): Unknown  . Sulfasalazine Other (See Comments)    Other reaction(s): Unknown  . Tramadol Other (See Comments)  . Celecoxib Nausea And Vomiting  .  Codeine Nausea Only    Other reaction(s): Other (See Comments) Other reaction(s): Unknown  . Cyclobenzaprine Other (See Comments) and Nausea Only    Other reaction(s): Other (See Comments)  . Oxycodone-Acetaminophen Nausea Only and Nausea And Vomiting  . Penicillin V Potassium Rash    Other reaction(s): Other (See Comments) Other reaction(s): Unknown    Medications: I have reviewed the patient's current medications.  Results for orders placed or performed during the hospital encounter of 04/19/20 (from the past 48 hour(s))  Resp Panel by RT-PCR (Flu A&B, Covid) Nasopharyngeal Swab     Status: None   Collection Time: 04/19/20  3:00 PM   Specimen: Nasopharyngeal Swab; Nasopharyngeal(NP) swabs in vial transport medium  Result Value Ref Range   SARS Coronavirus 2 by RT PCR NEGATIVE NEGATIVE    Comment: (NOTE) SARS-CoV-2 target nucleic acids are NOT DETECTED.  The SARS-CoV-2 RNA is generally detectable in upper respiratory specimens during the acute phase of infection. The lowest concentration of SARS-CoV-2 viral copies this assay can detect is 138 copies/mL. A negative result does not preclude SARS-Cov-2 infection and should not be used as the sole basis for treatment or other patient management decisions. A negative result may occur with  improper specimen collection/handling, submission of specimen other than nasopharyngeal swab, presence of viral mutation(s) within the areas targeted by this assay, and inadequate number of viral copies(<138 copies/mL). A negative result must be combined with clinical observations, patient history, and epidemiological information. The expected result is Negative.  Fact Sheet for Patients:  EntrepreneurPulse.com.au  Fact Sheet for Healthcare  Providers:  IncredibleEmployment.be  This test is no t yet approved or cleared by the [place] and  has been authorized for detection and/or diagnosis of  SARS-CoV-2 by FDA under an Emergency Use Authorization (EUA). This EUA will remain  in effect (meaning this test can be used) for the duration of the COVID-19 declaration under Section 564(b)(1) of the Act, 21 U.S.C.section 360bbb-3(b)(1), unless the authorization is terminated  or revoked sooner.       Influenza A by PCR NEGATIVE NEGATIVE   Influenza B by PCR NEGATIVE NEGATIVE    Comment: (NOTE) The Xpert Xpress SARS-CoV-2/FLU/RSV plus assay is intended as an aid in the diagnosis of influenza from Nasopharyngeal swab specimens and should not be used as a sole basis for treatment. Nasal washings and aspirates are unacceptable for Xpert Xpress SARS-CoV-2/FLU/RSV testing.  Fact Sheet for Patients: EntrepreneurPulse.com.au  Fact Sheet for Healthcare Providers: IncredibleEmployment.be  This test is not yet approved or cleared by the [place] FDA and has been authorized for detection and/or diagnosis of SARS-CoV-2 by FDA under an Emergency Use Authorization (EUA). This EUA will remain in effect (meaning this test can be used) for the duration of the COVID-19 declaration under Section 564(b)(1) of the Act, 21 U.S.C. section 360bbb-3(b)(1), unless the authorization is terminated or revoked.  Performed at [facility], [address] [place], [place] [address]   Comprehensive metabolic panel     Status: Abnormal   Collection Time: 04/20/20  4:40 AM  Result Value Ref Range   Sodium 135 135 - 145 mmol/L   Potassium 4.4 3.5 - 5.1 mmol/L   Chloride 101 98 - 111 mmol/L   CO2 26 22 - 32 mmol/L   Glucose, Bld 121 (H) 70 - 99 mg/dL    Comment: Glucose reference range applies only to samples taken after fasting for at least 8 hours.   BUN 18 8 - 23 mg/dL   Creatinine, Ser 0.90 0.44 - 1.00 mg/dL   Calcium 9.5 8.9 - 10.3 mg/dL   Total Protein 7.0 6.5 - 8.1 g/dL   Albumin 3.5 3.5 - 5.0 g/dL   AST 14 (L) 15 - 41 U/L   ALT 9 0 - 44 U/L    Alkaline Phosphatase 57 38 - 126 U/L   Total Bilirubin 0.8 0.3 - 1.2 mg/dL   GFR, Estimated >60 >60 mL/min    Comment: (NOTE) Calculated using the CKD-EPI Creatinine Equation (2021)    Anion gap 8 5 - 15    Comment: Performed at [facility], [address] [place], [place] [address]  CBC     Status: None   Collection Time: 04/20/20  4:40 AM  Result Value Ref Range   WBC 8.2 4.0 - 10.5 K/uL   RBC 4.86 3.87 - 5.11 MIL/uL   Hemoglobin 14.3 12.0 - 15.0 g/dL   HCT 43.3 36.0 - 46.0 %   MCV 89.1 80.0 - 100.0 fL   MCH 29.4 26.0 - 34.0 pg   MCHC 33.0 30.0 - 36.0 g/dL   RDW 13.8 11.5 - 15.5 %   Platelets 345 150 - 400 K/uL   nRBC 0.0 0.0 - 0.2 %    Comment: Performed at [facility], [address], [place], [place] [address]    No results found.  Review of Systems Blood pressure (!) 156/91, pulse 79, temperature (!) 97.4 F (36.3 C), temperature source Oral, resp. rate 16, height 5\' 2"  (1.575 m), weight 74.8 kg, SpO2 96 %.  Physical Exam She is tender at T8 with palpable slight kyphotic deformity.  She has no clonus.  She is fairly comfortable lying in bed but apparently is in significant pain with standing. Assessment/Plan: T1 and T8 compression fractures, T1 being evaluated by neurosurgery.  Her T8 fracture could be addressed with kyphoplasty if she desires.  At present I do not feel that she is comfortable making that decision and will be back tomorrow to discuss this again.  If needed she could go on to rehab and have this done as an outpatient at a later date.  Hessie Knows 04/21/2020, 11:57 AM

## 2020-04-22 DIAGNOSIS — S22060D Wedge compression fracture of T7-T8 vertebra, subsequent encounter for fracture with routine healing: Secondary | ICD-10-CM | POA: Diagnosis not present

## 2020-04-22 DIAGNOSIS — S22010D Wedge compression fracture of first thoracic vertebra, subsequent encounter for fracture with routine healing: Secondary | ICD-10-CM | POA: Diagnosis not present

## 2020-04-22 DIAGNOSIS — R531 Weakness: Secondary | ICD-10-CM | POA: Diagnosis not present

## 2020-04-22 DIAGNOSIS — G4734 Idiopathic sleep related nonobstructive alveolar hypoventilation: Secondary | ICD-10-CM | POA: Diagnosis not present

## 2020-04-22 DIAGNOSIS — I10 Essential (primary) hypertension: Secondary | ICD-10-CM

## 2020-04-22 MED ORDER — LABETALOL HCL 5 MG/ML IV SOLN
10.0000 mg | Freq: Once | INTRAVENOUS | Status: AC
  Administered 2020-04-22: 10 mg via INTRAVENOUS
  Filled 2020-04-22: qty 4

## 2020-04-22 MED ORDER — KETOROLAC TROMETHAMINE 15 MG/ML IJ SOLN
15.0000 mg | Freq: Every day | INTRAMUSCULAR | Status: DC | PRN
  Administered 2020-04-22: 15 mg via INTRAVENOUS
  Filled 2020-04-22: qty 1

## 2020-04-22 MED ORDER — AMLODIPINE BESYLATE 5 MG PO TABS
2.5000 mg | ORAL_TABLET | Freq: Every day | ORAL | Status: DC
  Administered 2020-04-22 – 2020-04-23 (×2): 2.5 mg via ORAL
  Filled 2020-04-22 (×2): qty 1

## 2020-04-22 MED ORDER — POLYETHYLENE GLYCOL 3350 17 G PO PACK
17.0000 g | PACK | Freq: Every day | ORAL | Status: DC
  Administered 2020-04-22 – 2020-04-23 (×2): 17 g via ORAL
  Filled 2020-04-22 (×2): qty 1

## 2020-04-22 NOTE — Progress Notes (Signed)
Patient ID: Alexandria Roman, female   DOB: 08-Jan-1930, 87 y.o.   MRN: [id number] [facility] PROGRESS NOTE  Alexandria Roman TIW:[id number] DOB: Nov 20, 1929 DOA: 04/19/2020 PCP: Adin Hector, MD  HPI/Subjective: Still having quite a bit of back pain.  Back pain severe with just turning in the bed and taking a deep breath.  Patient still undecided on kyphoplasty or not.  Had to cut back on pain medications secondary to her difficulty thinking the past couple days.  Admitted with compression fractures and inability to walk.  Physical therapy recommended rehab.  Objective: Vitals:   04/22/20 0824 04/22/20 1130  BP: (!) 186/80 (!) 155/94  Pulse: 79 73  Resp: 14   Temp: 98.1 F (36.7 C) 97.7 F (36.5 C)  SpO2: 93% 96%    Intake/Output Summary (Last 24 hours) at 04/22/2020 1426 Last data filed at 04/21/2020 2157 Gross per 24 hour  Intake 120 ml  Output 200 ml  Net -80 ml   Filed Weights   04/19/20 0906  Weight: 74.8 kg    ROS: Review of Systems  Respiratory: Negative for shortness of breath.   Cardiovascular: Negative for chest pain.  Gastrointestinal: Positive for constipation. Negative for abdominal pain, nausea and vomiting.  Musculoskeletal: Positive for back pain.   Exam: Physical Exam HENT:     Head: Normocephalic.     Mouth/Throat:     Pharynx: No oropharyngeal exudate.  Eyes:     General: Lids are normal.     Conjunctiva/sclera: Conjunctivae normal.  Cardiovascular:     Rate and Rhythm: Normal rate and regular rhythm.     Heart sounds: Normal heart sounds, S1 normal and S2 normal.  Pulmonary:     Breath sounds: Examination of the right-lower field reveals decreased breath sounds. Examination of the left-lower field reveals decreased breath sounds. Decreased breath sounds present. No wheezing, rhonchi or rales.  Abdominal:     Palpations: Abdomen is soft.     Tenderness: There is no abdominal tenderness.  Musculoskeletal:     Right lower leg: No swelling.      Left lower leg: No swelling.  Skin:    General: Skin is warm.     Findings: No rash.  Neurological:     Mental Status: She is alert.     Comments: Answers questions appropriately but difficult to focus. Able to straight leg raise.       Data Reviewed: Basic Metabolic Panel: Recent Labs  Lab 04/19/20 0856 04/20/20 0440  NA 135 135  K 3.9 4.4  CL 99 101  CO2 28 26  GLUCOSE 132* 121*  BUN 17 18  CREATININE 0.82 0.90  CALCIUM 9.4 9.5   Liver Function Tests: Recent Labs  Lab 04/19/20 0856 04/20/20 0440  AST 16 14*  ALT 10 9  ALKPHOS 61 57  BILITOT 0.9 0.8  PROT 7.2 7.0  ALBUMIN 3.7 3.5   CBC: Recent Labs  Lab 04/19/20 0856 04/20/20 0440  WBC 10.8* 8.2  NEUTROABS 8.5*  --   HGB 14.4 14.3  HCT 44.4 43.3  MCV 88.6 89.1  PLT 329 345   Cardiac Enzymes: Recent Labs  Lab 04/19/20 0856  CKTOTAL 34*     Recent Results (from the past 240 hour(s))  Resp Panel by RT-PCR (Flu A&B, Covid) Nasopharyngeal Swab     Status: None   Collection Time: 04/19/20  3:00 PM   Specimen: Nasopharyngeal Swab; Nasopharyngeal(NP) swabs in vial transport medium  Result Value Ref Range Status  SARS Coronavirus 2 by RT PCR NEGATIVE NEGATIVE Final    Comment: (NOTE) SARS-CoV-2 target nucleic acids are NOT DETECTED.  The SARS-CoV-2 RNA is generally detectable in upper respiratory specimens during the acute phase of infection. The lowest concentration of SARS-CoV-2 viral copies this assay can detect is 138 copies/mL. A negative result does not preclude SARS-Cov-2 infection and should not be used as the sole basis for treatment or other patient management decisions. A negative result may occur with  improper specimen collection/handling, submission of specimen other than nasopharyngeal swab, presence of viral mutation(s) within the areas targeted by this assay, and inadequate number of viral copies(<138 copies/mL). A negative result must be combined with clinical  observations, patient history, and epidemiological information. The expected result is Negative.  Fact Sheet for Patients:  EntrepreneurPulse.com.au  Fact Sheet for Healthcare Providers:  IncredibleEmployment.be  This test is no t yet approved or cleared by the [place] FDA and  has been authorized for detection and/or diagnosis of SARS-CoV-2 by FDA under an Emergency Use Authorization (EUA). This EUA will remain  in effect (meaning this test can be used) for the duration of the COVID-19 declaration under Section 564(b)(1) of the Act, 21 U.S.C.section 360bbb-3(b)(1), unless the authorization is terminated  or revoked sooner.       Influenza A by PCR NEGATIVE NEGATIVE Final   Influenza B by PCR NEGATIVE NEGATIVE Final    Comment: (NOTE) The Xpert Xpress SARS-CoV-2/FLU/RSV plus assay is intended as an aid in the diagnosis of influenza from Nasopharyngeal swab specimens and should not be used as a sole basis for treatment. Nasal washings and aspirates are unacceptable for Xpert Xpress SARS-CoV-2/FLU/RSV testing.  Fact Sheet for Patients: EntrepreneurPulse.com.au  Fact Sheet for Healthcare Providers: IncredibleEmployment.be  This test is not yet approved or cleared by the [place] FDA and has been authorized for detection and/or diagnosis of SARS-CoV-2 by FDA under an Emergency Use Authorization (EUA). This EUA will remain in effect (meaning this test can be used) for the duration of the COVID-19 declaration under Section 564(b)(1) of the Act, 21 U.S.C. section 360bbb-3(b)(1), unless the authorization is terminated or revoked.  Performed at [facility], [address] [place], [place] [address]       Scheduled Meds: . calcitonin (salmon)  1 spray Alternating Nares Daily  . DULoxetine  30 mg Oral BID  . Ipratropium-Albuterol  2 puff Inhalation TID  . latanoprost  1 drop Both Eyes  QHS  . levothyroxine  50 mcg Oral QAC breakfast  . metaxalone  400 mg Oral TID  . multivitamin-lutein  1 capsule Oral BID  . polyethylene glycol  17 g Oral Daily  . timolol  1 drop Both Eyes Daily   Brief history.  87 year old female with history of hypertension, hypothyroidism, hyperlipidemia, depression, anxiety, presenting with worsening back pain.  She had a fall as outpatient.  Patient admitted 04/19/2020.  She was found to have compression fractures of T1 and T8.  Seen by neurosurgery and orthopedic surgery.  Physical therapy recommending rehab.  Assessment/Plan:  1. Thoracic compression fracture at T1 and T8.  The patient still having severe extreme pain with taking a deep breath and turning in the bed.  Unfortunately unable to give IV narcotics because of worsening mental status.  We will give a trial of IV Toradol prior to working with physical therapy.  Continue low-dose oxycodone as needed for pain.  Dr. Lacinda Axon neurosurgery did not recommend surgical management for T1.  Patient undecided  about kyphoplasty with Dr. Rudene Christians for T8.  A different back brace was ordered and should arrive tomorrow.  Recommend Fosamax as outpatient.  Miacalcin nasal spray. 2. Nocturnal hypoxia.  Qualifies for oxygen at night with overnight oximetry by respiratory.  Continue 2 L of oxygen at night. 3. Weakness.  Physical therapy recommends rehab.  The patient lives alone and with the amount of pain that she is having will end up needing rehab. 4. Neuropathy.  On as needed gabapentin 5. Anxiety on Cymbalta and Ativan 6. Hypothyroidism unspecified.  Continue levothyroxine 7. Glaucoma.  Continue timolol and latanoprost 8. Essential hypertension.  We will give low-dose Norvasc 9. Send a Covid test in anticipation of discharge to rehab 10. Constipation.  MiraLAX daily     Code Status:     Code Status Orders  (From admission, onward)         Start     Ordered   04/19/20 1713  Full code  Continuous         04/19/20 1714        Code Status History    Date Active Date Inactive Code Status Order ID Comments User Context   05/19/2018 2220 05/23/2018 1518 Full Code [id number]  Harrie Foreman, MD ED   03/19/2016 0544 03/20/2016 1942 Full Code [id number]  Saundra Shelling, MD ED   Advance Care Planning Activity     Family Communication: Spoke with patient's son on the phone Disposition Plan: Status is: Inpatient  Dispo: The patient is from: Independent living              Anticipated d/c is to: Rehab once insurance authorizes.              Patient currently can go to rehab once insurance authorizes.   Difficult to place patient.  Hopefully not.  Consultants:  Neurosurgery  Orthopedic surgery  Time spent: 28 minutes  [place]

## 2020-04-22 NOTE — TOC Progression Note (Signed)
Transition of Care [facility]) - Progression Note    Patient Details  Name: Alexandria Roman MRN: [id number] Date of Birth: Jul 02, 1929  [facility]) CM/SW Contact  Beverly Sessions, RN Phone Number: 04/22/2020, 2:59 PM  Clinical Narrative:    Bed offers presented to son via phone He has accepted bed at [facility] Accepted in [place] and notified Tammy at [facility].   TOC to start insurance auth today.  Will need repeat covid test prior to discharge    Expected Discharge Plan: [place] Barriers to Discharge: Continued Medical Work up  Expected Discharge Plan and Services Expected Discharge Plan: [place] arrangements for the past 2 months: Apartment                                       Social Determinants of Health (SDOH) Interventions    Readmission Risk Interventions No flowsheet data found.

## 2020-04-22 NOTE — Progress Notes (Signed)
Ms. Rocha is seen for multiple thoracic compression fractures.  At this time, no surgical intervention is recommended.  She is neurologically intact but is complaining of continued pain.  She has a TLSO brace and we would recommend a CTO extension.  She will need to remain in the brace until follow-up in clinic.  She should have this on at all times when out of bed.  If there is a concern for any neurologic change, please contact the neurosurgery team.

## 2020-04-22 NOTE — Progress Notes (Signed)
Physical Therapy Treatment Patient Details Name: Alexandria Roman MRN: [id number] DOB: 1929-04-27 Today's Date: 04/22/2020    History of Present Illness Pt is an 87 y.o. female presenting to hospital 3/5 s/p fall on Saturday and has had severe back pain since (worset pain near bra line).  Imaging showing new T1 and T8 compression fx's.  Per neurosurgery no indication for surgery; recommending brace (TLSO).  PMH includes CA, htn, R hip hemiarthroplasty.    PT Comments    Pt was supine in bed with HOB elevated ~ 30 degrees upon arriving. She is awake with supportive friend at bedside. She does have some cognition deficits come to light throughout session. Poor safety awareness and poor insight of deficits. She required assistance to exit R side of bed. Sat EOB while author total assist adjusted and applied TLSO. Pt is awaiting CTLSO to be delivered. MD did clear pt to get OOB today prior to brace arriving. She was able to stand and ambulate 25 ft with RW. Poor safety awareness and balance during gait training. Highly recommend DC to SNF to address deficits while improving safety with ADLs.    Follow Up Recommendations  SNF     Equipment Recommendations  Rolling walker with 5" wheels;3in1 (PT)    Recommendations for Other Services       Precautions / Restrictions Precautions Precautions: Fall;Back Precaution Booklet Issued: No Precaution Comments: TLSO brace only when OOB; may remove brace to shower Required Braces or Orthoses: Other Brace (CTLSO ordered. will be delivered 04/22/20 at earliest) Spinal Brace: Thoracolumbosacral orthotic;Other (comment) (awaiting CTLSO) Restrictions Weight Bearing Restrictions: No    Mobility  Bed Mobility Overal bed mobility: Needs Assistance Bed Mobility: Rolling;Sidelying to Sit;Sit to Supine Rolling: Mod assist;Max assist Sidelying to sit: Mod assist;Max assist   Sit to supine: Max assist   General bed mobility comments: pt was able to roll R to  short sit with increased time and max assist. pt has poor initiation of movements. needs constant tactle and verbal cues throughout for correct sequencing and for safety    Transfers Overall transfer level: Needs assistance Equipment used: Rolling walker (2 wheeled) Transfers: Sit to/from Stand Sit to Stand: Min guard         General transfer comment: CGA for safety with vcs for improved technique and sequencing  Ambulation/Gait Ambulation/Gait assistance: Min assist Gait Distance (Feet): 25 Feet Assistive device: Rolling walker (2 wheeled) Gait Pattern/deviations: Scissoring;Narrow base of support;Staggering left;Staggering right Gait velocity: decreased   General Gait Details: Pt was very unsteady on feet. Has narrow BOS and 1 episode of scissoring with intervention to prevent LOB/fall.       Balance Overall balance assessment: Needs assistance Sitting-balance support: No upper extremity supported;Feet supported Sitting balance-Leahy Scale: Good     Standing balance support: No upper extremity supported Standing balance-Leahy Scale: Poor Standing balance comment: high fall risk         Cognition Arousal/Alertness: Awake/alert Behavior During Therapy: WFL for tasks assessed/performed Overall Cognitive Status: Impaired/Different from baseline Area of Impairment: Orientation;Attention;Following commands;Awareness;Safety/judgement;Problem solving;Memory      General Comments: Pt is A however has some cognition deficits come to light throughout session. oriented to self and location however poor insight of deficits or spinal precautions             Pertinent Vitals/Pain Pain Assessment: 0-10 Pain Score: 7  Faces Pain Scale: Hurts even more Pain Location: mid/upper back Pain Descriptors / Indicators: Guarding;Tender Pain Intervention(s): Limited activity within patient's tolerance;Monitored  during session;Premedicated before session;Repositioned           PT  Goals (current goals can now be found in the care plan section) Acute Rehab PT Goals Patient Stated Goal: to improve pain Progress towards PT goals: Progressing toward goals    Frequency    7X/week      PT Plan Current plan remains appropriate       AM-PAC PT "6 Clicks" Mobility   Outcome Measure  Help needed turning from your back to your side while in a flat bed without using bedrails?: A Lot Help needed moving from lying on your back to sitting on the side of a flat bed without using bedrails?: A Lot Help needed moving to and from a bed to a chair (including a wheelchair)?: A Lot Help needed standing up from a chair using your arms (e.g., wheelchair or bedside chair)?: A Little Help needed to walk in hospital room?: A Little Help needed climbing 3-5 steps with a railing? : A Little 6 Click Score: 15    End of Session Equipment Utilized During Treatment: Back brace;Other (comment) (TSLO awaiting CTLSO) Activity Tolerance: Patient tolerated treatment well;Patient limited by pain Patient left: in bed;with call bell/phone within reach;with bed alarm set Nurse Communication: Mobility status PT Visit Diagnosis: Other abnormalities of gait and mobility (R26.89);Muscle weakness (generalized) (M62.81);History of falling (Z91.81);Difficulty in walking, not elsewhere classified (R26.2);Pain     Time: 1550-1620 PT Time Calculation (min) (ACUTE ONLY): 30 min  Charges:  $Gait Training: 8-22 mins $Therapeutic Activity: 8-22 mins                     Julaine Fusi PTA 04/22/20, 4:40 PM

## 2020-04-22 NOTE — Progress Notes (Signed)
I discussed potential treatment with kyphoplasty with patient today.  She still uncertain regarding whether she would like to proceed with kyphoplasty.  We will discuss again later.

## 2020-04-22 NOTE — Care Management Important Message (Signed)
Important Message  Patient Details  Name: Alexandria Roman MRN: [id number] Date of Birth: [birth year]   Medicare Important Message Given:  Yes  Reviewed with son, Danaya Geddis, at [phone].  Copy of Medicare IM sent securely to son's attention at [email or web address].   Copy of Medicare IM also left in patient's room for reference.     Dannette Barbara 04/22/2020, 11:24 AM

## 2020-04-23 DIAGNOSIS — G4734 Idiopathic sleep related nonobstructive alveolar hypoventilation: Secondary | ICD-10-CM | POA: Diagnosis not present

## 2020-04-23 DIAGNOSIS — S22060D Wedge compression fracture of T7-T8 vertebra, subsequent encounter for fracture with routine healing: Secondary | ICD-10-CM | POA: Diagnosis not present

## 2020-04-23 DIAGNOSIS — R531 Weakness: Secondary | ICD-10-CM

## 2020-04-23 LAB — SARS CORONAVIRUS 2 (TAT 6-24 HRS): SARS Coronavirus 2: NEGATIVE

## 2020-04-23 MED ORDER — AMLODIPINE BESYLATE 2.5 MG PO TABS: 2.5000 mg | ORAL_TABLET | Freq: Every day | ORAL | Status: DC

## 2020-04-23 MED ORDER — CALCITONIN (SALMON) 200 UNIT/ACT NA SOLN: 1.0000 | Freq: Every day | NASAL | 0 refills | Status: AC

## 2020-04-23 MED ORDER — OXYCODONE HCL 5 MG PO TABS: 2.5000 mg | ORAL_TABLET | ORAL | 0 refills | Status: DC | PRN

## 2020-04-23 MED ORDER — POLYETHYLENE GLYCOL 3350 17 G PO PACK: 17.0000 g | PACK | Freq: Every day | ORAL | 0 refills | Status: DC

## 2020-04-23 NOTE — TOC Transition Note (Addendum)
Transition of Care [facility]) - CM/SW Discharge Note   Patient Details  Name: Alexandria Roman MRN: [id number] Date of Birth: 1929/10/16  [facility] CM/SW Contact:  Beverly Sessions, RN Phone Number: 04/23/2020, 11:47 AM   Clinical Narrative:     Discharging to [facility] Today  Son updated Bedside RN to call report Repeat covid test negative   EMS packet on the chart  EMS transport to be arranged after [facility] assigns a room     130pm EMS transport called.  Patient is 4th-5th on the list   Final next level of care: Skilled Nursing Facility Barriers to Discharge: No Barriers Identified   Patient Goals and CMS Choice        Discharge Placement              Patient chooses bed at: [facility] [place] Patient to be transferred to facility by: EMS Name of family member notified: Gwyndolyn Saxon Patient and family notified of of transfer: 04/23/20  Discharge Plan and Services                                     Social Determinants of Health (SDOH) Interventions     Readmission Risk Interventions No flowsheet data found.

## 2020-04-23 NOTE — Progress Notes (Signed)
Pt discharged to [facility] per mobile transport.  All belongings sent with pt.  VSS at the time of discharge

## 2020-04-23 NOTE — Discharge Summary (Signed)
Physician Discharge Summary  Patient ID: Alexandria Roman MRN: [id number] DOB/AGE: 85-Jun-1931 85 y.o.  Admit date: 04/19/2020 Discharge date: 04/23/2020  Admission Diagnoses:  Discharge Diagnoses:  Active Problems:   Essential hypertension   CKD (chronic kidney disease) stage 3, GFR 30-59 ml/min (HCC)   Hypothyroidism, unspecified   Mixed hyperlipidemia   Depression with anxiety   Hip fracture (HCC)   Thoracic compression fracture (HCC)   Weakness   Neuropathy   Glaucoma of both eyes   Nocturnal hypoxia   Discharged Condition: fair  Hospital Course:  87 year old female with history of hypertension, hypothyroidism, hyperlipidemia, depression, anxiety, presenting with worsening back pain.  She had a fall as outpatient.  Patient admitted 04/19/2020.  She was found to have compression fractures of T1 and T8.  Seen by neurosurgery and orthopedic surgery.  Physical therapy recommending rehab.  #1.  Thoracic compression fracture at T1 and T8. Patient has been seen by neurosurgeon, does not recommend surgery for T1 fracture.  Patient was also evaluated by Dr. Rudene Christians T8 compression fracture, patient has not made a decision about surgery.  However, due to patient confusion, documents no longer recommend surgery at this time. Patient has been receiving physical therapy and Occupational Therapy.  She is also given lower dose of pain medicine with Percocet, she is also getting Miacalcin nasal spray.  Patient has been accepted for nursing home placement today, she will be discharged today.  2.  Nocturnal hypoxia. She will needed 2 L oxygen at nighttime.  #3.  General weakness and neuropathy. Continue home medicines.  4.  Essential hypertension. Continue lower dose of Norvasc.   Consults: orthopedic surgery  Significant Diagnostic Studies:  CT THORACIC SPINE WITHOUT CONTRAST  TECHNIQUE: Multidetector CT images of the thoracic were obtained using the standard protocol without intravenous  contrast.  COMPARISON:  MRI 04/17/2020  FINDINGS: Alignment: Accentuation of kyphosis. Mild osseous retropulsion at T1.  Vertebrae: T1 compression fracture as discussed on recent MRI. Fracture line extends through superior and inferior endplates with approximately 40% loss of height. There is likely involvement of the posterior margin of the vertebral body where there is mild retropulsion. Additional fracture of the left T1 lamina extending into the inferior facet without displacement. T8 compression fracture is also again identified with involvement of superior and inferior endplates. Posterior vertebral body margin appears spared. Height loss is approximately 40%. Thoracic vertebral body heights are otherwise maintained. Partially imaged chronic L1 compression fracture with kyphoplasty.  Paraspinal and other soft tissues: Large hiatal hernia.  Disc levels: Degenerative changes and spinal canal are better evaluated on the prior MRI.  IMPRESSION: T1 and T8 compression fractures with less than 50% loss of height. Additional involvement of the left T1 lamina extending into the inferior facet without displacement.   Electronically Signed   By: Macy Mis M.D.   On: 04/19/2020 11:41  MRI THORACIC SPINE WITHOUT CONTRAST  TECHNIQUE: Multiplanar, multisequence MR imaging of the thoracic spine was performed. No intravenous contrast was administered.  COMPARISON:  None.  FINDINGS: Alignment: Kyphosis from C7-T2 related to the T1 fracture as described below.  Vertebrae: Recent compression fracture at T1 loss of height of 40%. Fracture line may extend all the way through the vertebral body. Consider CT for further characterization. There is facet and ligamentous hypertrophy at this level. Kyphotic curvature is present because of the fracture deformity. There is spinal stenosis with effacement of the subarachnoid space but no visible frank cord compression.  There could be foraminal encroachment, particularly  on the left.  Inferior endplate edema anteriorly at T7 which could relate to a Schmorl's node or a mild recent partial inferior compression fracture.  Compression deformity of T8, affecting the inferior endplate more than the superior endplate, with maximal loss of height centrally and posterior of 25%. No retropulsed bone.  Old healed augmented fracture at L1.  Cord: As noted above, there is spinal stenosis at the T1 level secondary to the fracture, but the cord is not clearly compressed. Otherwise, the cord appears unremarkable.  Paraspinal and other soft tissues: Negative  Disc levels:  T1-2: Spinal canal narrowing as above. Contributing factors include acute compression fracture of T1 with mild posterior bowing, bulging of the disc and prominence of the facets and ligaments.  T2-3 through T11-12: Mild chronic disc degeneration with bulging but no compressive narrowing of the canal or foramina. Ordinary facet osteoarthritis.  T12-L1 and L1-2: Bulging of the discs related to the old L1 compression fracture. Posterior bowing of the posterosuperior margin of the L1 vertebral body. Some narrowing of the spinal canal this level but no likely neural compression  IMPRESSION: 1. Recent compression fracture at T1 with loss of height of 40%. Fracture line may extend all the way through the vertebral body. Consider CT for further characterization. Canal narrowing without clear cord compression. Foraminal narrowing left more than right. 2. Inferior endplate edema anteriorly at T7 which could relate to a Schmorl's node or a recent mild partial inferior compression fracture. 3. Acute or subacute fracture at T8 with maximal loss of height in the posterior central portion of 25%. No retropulsed bone. 4. Old healed augmented fracture at L1. 5. These results will be called to the ordering clinician or representative by the  Radiologist Assistant, and communication documented in the PACS or [employer].   Electronically Signed   By: Nelson Chimes M.D.   On: 04/18/2020 11:52    Treatments: Symptomatic treatment.  Discharge Exam: Blood pressure (!) 162/90, pulse 92, temperature (!) 97.5 F (36.4 C), temperature source Oral, resp. rate 18, height 5\' 2"  (1.575 m), weight 74.8 kg, SpO2 93 %. General appearance: alert, cooperative and Has some confusion, oriented to place and person. Resp: clear to auscultation bilaterally Cardio: regular rate and rhythm, S1, S2 normal, no murmur, click, rub or gallop GI: soft, non-tender; bowel sounds normal; no masses,  no organomegaly Extremities: extremities normal, atraumatic, no cyanosis or edema  Disposition: Discharge disposition: 03-Skilled Nursing Facility       Discharge Instructions    Diet - low sodium heart healthy   Complete by: As directed    Increase activity slowly   Complete by: As directed      Allergies as of 04/23/2020      Reactions   Amoxicillin-pot Clavulanate Diarrhea   Cephalexin Other (See Comments), Nausea And Vomiting   Hydrochlorothiazide Other (See Comments), Nausea And Vomiting   Hydrocodone-chlorpheniramine Other (See Comments)   Hydrocodone-homatropine Other (See Comments)   Metronidazole Other (See Comments)   Other reaction(s): Other (See Comments) Other Reaction: Not Assessed Other Reaction: Not Assessed   Moxifloxacin Other (See Comments)   Prednisone Nausea Only   Simvastatin Other (See Comments)   Sulfa Antibiotics Other (See Comments)   Other reaction(s): Unknown   Sulfasalazine Other (See Comments)   Other reaction(s): Unknown   Tramadol Other (See Comments)   Celecoxib Nausea And Vomiting   Codeine Nausea Only   Other reaction(s): Other (See Comments) Other reaction(s): Unknown   Cyclobenzaprine Other (See Comments), Nausea  Only   Other reaction(s): Other (See Comments)   Oxycodone-acetaminophen Nausea  Only, Nausea And Vomiting   Penicillin V Potassium Rash   Other reaction(s): Other (See Comments) Other reaction(s): Unknown      Medication List    STOP taking these medications   traMADol 50 MG tablet Commonly known as: ULTRAM     TAKE these medications   amLODipine 2.5 MG tablet Commonly known as: NORVASC Take 1 tablet (2.5 mg total) by mouth daily. Start taking on: April 24, 2020   calcitonin (salmon) 200 UNIT/ACT nasal spray Commonly known as: MIACALCIN/FORTICAL Place 1 spray into alternate nostrils daily.   Combivent Respimat 20-100 MCG/ACT Aers respimat Generic drug: Ipratropium-Albuterol Inhale 2 puffs into the lungs in the morning, at noon, and at bedtime.   docusate sodium 100 MG capsule Commonly known as: COLACE Take 100 mg by mouth as needed for constipation.   DULoxetine 30 MG capsule Commonly known as: CYMBALTA Take 1 capsule by mouth 2 (two) times daily.   gabapentin 400 MG capsule Commonly known as: NEURONTIN Take 800 mg by mouth 2 (two) times daily as needed for pain.   latanoprost 0.005 % ophthalmic solution Commonly known as: XALATAN   levothyroxine 50 MCG tablet Commonly known as: SYNTHROID Take 50 mcg by mouth daily before breakfast.   metaxalone 800 MG tablet Commonly known as: SKELAXIN Take 400 mg by mouth 3 (three) times daily.   oxyCODONE 5 MG immediate release tablet Commonly known as: Oxy IR/ROXICODONE Take 0.5 tablets (2.5 mg total) by mouth every 4 (four) hours as needed for moderate pain or severe pain.   polyethylene glycol 17 g packet Commonly known as: MIRALAX / GLYCOLAX Take 17 g by mouth daily. Start taking on: April 24, 2020   PreserVision/Lutein Caps Take 1 capsule by mouth 2 (two) times daily.   timolol 0.5 % ophthalmic solution Commonly known as: TIMOPTIC SMARTSIG:In Eye(s)       Contact information for follow-up providers    Tama High III, MD Follow up in 1 week(s).   Specialty: Internal  Medicine Contact information: [place] [address] [phone]            Contact information for after-discharge care    Destination    [facility] Preferred SNF .   Service: Skilled Nursing Contact information: [address] [place] [phone]                 34 minutes Signed: Sharen Hones 04/23/2020, 11:16 AM

## 2020-04-23 NOTE — Progress Notes (Signed)
Patient resting comfortably in bed.  At this time she is continuing to have pain.  She is still uncertain whether she should have kyphoplasty.  I think there is a level of confusion at this time we will hold off on any procedure.

## 2020-04-23 NOTE — NC FL2 (Signed)
[place] LEVEL OF CARE SCREENING TOOL     IDENTIFICATION  Patient Name: Alexandria Roman Birthdate: January 29, 1930 Sex: female Admission Date (Current Location): 04/19/2020  [facility] and [place] Number:  Engineering geologist and Address:         Provider Number: [id number]  Attending Physician Name and Address:  Sharen Hones, MD  Relative Name and Phone Number:       Current Level of Care: Hospital Recommended Level of Care: [place] Prior Approval Number:    Date Approved/Denied:   PASRR Number: [id number]  Discharge Plan: SNF    Current Diagnoses: Patient Active Problem List   Diagnosis Date Noted  . Nocturnal hypoxia   . Weakness   . Neuropathy   . Glaucoma of both eyes   . Thoracic compression fracture ([place]) 04/19/2020  . Hip fracture ([place]) 05/19/2018  . Depression with anxiety 12/09/2016  . Numbness of foot 12/09/2016  . Macular degeneration 12/09/2016  . Gait instability 12/08/2016  . Upper respiratory infection, viral 12/06/2016  . Primary osteoarthritis of both knees 07/20/2016  . Postmenopausal osteoporosis 04/15/2016  . Chest pain 03/19/2016  . Spinal stenosis 08/04/2015  . Myalgia 06/11/2015  . Polyarthralgia 06/11/2015  . Chronic midline low back pain with bilateral sciatica 01/10/2015  . Essential hypertension 06/04/2014  . CKD (chronic kidney disease) stage 3, GFR 30-59 ml/min (HCC) 06/04/2014  . Mixed hyperlipidemia 06/04/2014  . Other allergic rhinitis 06/04/2014  . DDD (degenerative disc disease), lumbar 09/07/2013  . Lumbar radiculitis 09/07/2013  . Hypothyroidism, unspecified 08/02/2013    Orientation RESPIRATION BLADDER Height & Weight     Self,Time,Situation,Place  O2 (2L O2 [place] at night) Continent Weight: 74.8 kg Height:  5\' 2"  (157.5 cm)  BEHAVIORAL SYMPTOMS/MOOD NEUROLOGICAL BOWEL NUTRITION STATUS      Continent Diet (Heart Healthy)  AMBULATORY STATUS COMMUNICATION OF NEEDS Skin   Limited Assist  Verbally Normal                       Personal Care Assistance Level of Assistance              Functional Limitations Info             SPECIAL CARE FACTORS FREQUENCY  PT (By licensed PT),OT (By licensed OT)                    Contractures Contractures Info: Not present    Additional Factors Info  Code Status,Allergies Code Status Info: Full Allergies Info: Amoxicillin-pot Clavulanate, Cephalexin, Hydrochlorothiazide, Hydrocodone-chlorpheniramine, Hydrocodone-homatropine, Metronidazole, Moxifloxacin, Prednisone, Simvastatin, Sulfa Antibiotics, Sulfasalazine, Tramadol, Celecoxib, Codeine, Cyclobenzaprine, Oxycodone-acetaminophen, Penicillin V Potassium           Current Medications (04/23/2020):  This is the current hospital active medication list Current Facility-Administered Medications  Medication Dose Route Frequency Provider Last Rate Last Admin  . acetaminophen (TYLENOL) tablet 650 mg  650 mg Oral Q6H PRN Clarnce Flock, MD   650 mg at 04/22/20 0330   Or  . acetaminophen (TYLENOL) suppository 650 mg  650 mg Rectal Q6H PRN Clarnce Flock, MD      . amLODipine (NORVASC) tablet 2.5 mg  2.5 mg Oral Daily Loletha Grayer, MD   2.5 mg at 04/23/20 0936  . calcitonin (salmon) (MIACALCIN/FORTICAL) nasal spray 1 spray  1 spray Alternating Nares Daily Loletha Grayer, MD   1 spray at 04/21/20 1423  . DULoxetine (CYMBALTA) DR capsule 30 mg  30 mg Oral BID  Clarnce Flock, MD   30 mg at 04/23/20 [phone]  . gabapentin (NEURONTIN) capsule 400 mg  400 mg Oral BID PRN Loletha Grayer, MD      . hydrOXYzine (ATARAX/VISTARIL) tablet 25 mg  25 mg Oral TID PRN Clarnce Flock, MD   25 mg at 04/22/20 2236  . Ipratropium-Albuterol (COMBIVENT) respimat 2 puff  2 puff Inhalation TID Clarnce Flock, MD   2 puff at 04/23/20 0940  . ketorolac (TORADOL) 15 MG/ML injection 15 mg  15 mg Intravenous Daily PRN Loletha Grayer, MD   15 mg at 04/22/20 1833  . latanoprost  (XALATAN) 0.005 % ophthalmic solution 1 drop  1 drop Both Eyes QHS Clarnce Flock, MD   1 drop at 04/22/20 2237  . levothyroxine (SYNTHROID) tablet 50 mcg  50 mcg Oral QAC breakfast Clarnce Flock, MD   50 mcg at 04/23/20 0935  . LORazepam (ATIVAN) tablet 0.25 mg  0.25 mg Oral BID PRN Loletha Grayer, MD   0.25 mg at 04/21/20 1421  . metaxalone (SKELAXIN) tablet 400 mg  400 mg Oral TID Clarnce Flock, MD   400 mg at 04/23/20 4270  . multivitamin-lutein (OCUVITE-LUTEIN) capsule 1 capsule  1 capsule Oral BID Clarnce Flock, MD   1 capsule at 04/23/20 6237  . ondansetron (ZOFRAN) tablet 4 mg  4 mg Oral Q6H PRN Clarnce Flock, MD   4 mg at 04/20/20 1311   Or  . ondansetron [facility]) injection 4 mg  4 mg Intravenous Q6H PRN Clarnce Flock, MD   4 mg at 04/19/20 1739  . oxyCODONE (Oxy IR/ROXICODONE) immediate release tablet 2.5 mg  2.5 mg Oral Q4H PRN Loletha Grayer, MD   2.5 mg at 04/22/20 1638  . polyethylene glycol (MIRALAX / GLYCOLAX) packet 17 g  17 g Oral Daily Loletha Grayer, MD   17 g at 04/23/20 0935  . timolol (TIMOPTIC) 0.5 % ophthalmic solution 1 drop  1 drop Both Eyes Daily Clarnce Flock, MD   1 drop at 04/23/20 6283  . traZODone (DESYREL) tablet 50 mg  50 mg Oral QHS PRN Clarnce Flock, MD         Discharge Medications: Please see discharge summary for a list of discharge medications.  Relevant Imaging Results:  Relevant Lab Results:   Additional Information ss [id number]  Beverly Sessions, RN

## 2020-04-23 NOTE — Progress Notes (Signed)
Occupational Therapy Treatment Patient Details Name: Alexandria Roman MRN: [id number] DOB: 10/02/1929 Today's Date: 04/23/2020    History of present illness Pt is an 87 y.o. female presenting to hospital 3/5 s/p fall on Saturday and has had severe back pain since (worset pain near bra line).  Imaging showing new T1 and T8 compression fx's.  Per neurosurgery no indication for surgery; recommending brace (TLSO).  PMH includes CA, htn, R hip hemiarthroplasty.   OT comments  Pt seen for OT treatment on this date. Upon arrival to room, pt awake in bed. Pt oriented to self, place, month & year, and is aware that she had a fall, however appearing more confused this session, stating "I just feel different", and requiring increased time for processing and problem solving; RN informed. Pt agreeable to tx. This date, pt required MOD A for bed mobility, MIN GUARD for sit<>stand transfers, and SUPERVISION for functional mobility with RW. Pt required MIN A for seated UB/LB dressing and MIN A for sit>stand toilet hygiene/clothing management. Pt is making good progress toward goals and continues to benefit from skilled OT services to maximize return to PLOF and minimize risk of future falls, injury, caregiver burden, and readmission. Will continue to follow POC. Discharge recommendation remains appropriate.    Follow Up Recommendations  SNF    Equipment Recommendations  Other (comment) (defer)       Precautions / Restrictions Precautions Precautions: Fall;Back Precaution Booklet Issued: No Precaution Comments: CTO/TLSO brace when OOB; may remove brace to shower Required Braces or Orthoses: Other Brace (awaiting CTLSO) Spinal Brace: Thoracolumbosacral orthotic;Other (comment) Other Brace: awaiting CTLSO       Mobility Bed Mobility Overal bed mobility: Needs Assistance Bed Mobility: Rolling;Sidelying to Sit Rolling: Min assist Sidelying to sit: Mod assist            Transfers Overall transfer  level: Needs assistance Equipment used: Rolling walker (2 wheeled) Transfers: Sit to/from Stand Sit to Stand: Min guard         General transfer comment: Verbal cues for safe hand placement    Balance Overall balance assessment: Needs assistance Sitting-balance support: No upper extremity supported;Feet supported Sitting balance-Leahy Scale: Good Sitting balance - Comments: Able to participate in LB dressing while seated EOB; no LOB observed   Standing balance support: Single extremity supported;During functional activity Standing balance-Leahy Scale: Poor Standing balance comment: MIN A for steadying during peri-care with unilateral UE support on RW                           ADL either performed or assessed with clinical judgement   ADL                   Upper Body Dressing : Minimal assistance;Sitting Upper Body Dressing Details (indicate cue type and reason): SET-UP to don hospital gown and MOD A to don TLSO Lower Body Dressing: Minimal assistance;Sitting/lateral leans Lower Body Dressing Details (indicate cue type and reason): To don socks via figure-4 position. MIN A to don socks over toes Toilet Transfer: Min guard;Ambulation;RW;Regular Toilet   Toileting- Clothing Manipulation and Hygiene: Minimal assistance;Sit to/from stand Toileting - Clothing Manipulation Details (indicate cue type and reason): MIN A for clothing manipulation and steadying in standing     Functional mobility during ADLs: Supervision/safety;Rolling walker                 Cognition Arousal/Alertness: Awake/alert Behavior During Therapy: WFL for tasks assessed/performed Overall  Cognitive Status: Impaired/Different from baseline                                 General Comments: Pt oriented to self, place, month & year, and is aware that she had a fall. Pt appearing more confused this session, stating "I just feel different", and requiring increased time for  processing and problem solving                   Pertinent Vitals/ Pain       Pain Assessment: Faces Faces Pain Scale: Hurts little more Pain Location: Back Pain Descriptors / Indicators: Guarding;Tender Pain Intervention(s): Limited activity within patient's tolerance;Monitored during session;Repositioned   Frequency  Min 2X/week        Progress Toward Goals  OT Goals(current goals can now be found in the care plan section)  Progress towards OT goals: Progressing toward goals  Acute Rehab OT Goals Patient Stated Goal: to improve pain OT Goal Formulation: With patient Time For Goal Achievement: 05/04/20 Potential to Achieve Goals: Good  Plan Discharge plan remains appropriate;Frequency remains appropriate       AM-PAC OT "6 Clicks" Daily Activity     Outcome Measure   Help from another person eating meals?: None Help from another person taking care of personal grooming?: A Little Help from another person toileting, which includes using toliet, bedpan, or urinal?: A Little Help from another person bathing (including washing, rinsing, drying)?: A Little Help from another person to put on and taking off regular upper body clothing?: A Little Help from another person to put on and taking off regular lower body clothing?: A Little 6 Click Score: 19    End of Session Equipment Utilized During Treatment: Back brace;Rolling walker  OT Visit Diagnosis: Unsteadiness on feet (R26.81);Muscle weakness (generalized) (M62.81);Pain Pain - Right/Left:  (back) Pain - part of body:  (back)   Activity Tolerance Patient tolerated treatment well   Patient Left in chair;with call bell/phone within reach;with chair alarm set   Nurse Communication Mobility status        Time: 4193-7902 OT Time Calculation (min): 48 min  Charges: OT General Charges $OT Visit: 1 Visit OT Treatments $Self Care/Home Management : 38-52 mins  Fredirick Maudlin, OTR/L [place]

## 2020-04-23 NOTE — Progress Notes (Signed)
Physical Therapy Treatment Patient Details Name: Alexandria Roman MRN: [id number] DOB: 04-29-1929 Today's Date: 04/23/2020    History of Present Illness Pt is an 87 y.o. female presenting to hospital 3/5 s/p fall on Saturday and has had severe back pain since (worset pain near bra line).  Imaging showing new T1 and T8 compression fx's.  Per neurosurgery no indication for surgery; recommending brace (TLSO).  PMH includes CA, htn, R hip hemiarthroplasty.    PT Comments    Pt was sitting in recliner with pre-fab TLSO donned. She agrees to PT session with amx encouragement. Did just have OT not long prior. She was able to ambulate ~ 20 ft with RW however continues to be extremely unsteady on her feet. Pt is high fall risk 2/2 to poor cognition and poor safety awareness. Orthotist arrived during session and Pryor Curia assisted with applying and re-fitting braces. Orthotist took new custom brace to modify and will return later this afternoon. Throughout session, pt greatly limited by pain. Plan is to DC top SNF to continue to progress strength, balance and safety with ADLs prior to returning home. Will continue to follow and progress per POC.    Follow Up Recommendations  SNF     Equipment Recommendations  Rolling walker with 5" wheels;3in1 (PT)    Recommendations for Other Services       Precautions / Restrictions Precautions Precautions: Fall;Back Precaution Booklet Issued: No Precaution Comments: Orthotist in room fitting clamshell CTLSO Required Braces or Orthoses: Other Brace Spinal Brace: Other (comment) (custome CTLSO) Spinal Brace Comments: pt has pre-fab TLSO on when orthotist arrived with custom CTLSO Other Brace: awaiting CTLSO Restrictions Weight Bearing Restrictions: No Other Position/Activity Restrictions: Thoracic spinal fx's    Mobility  Bed Mobility Overal bed mobility: Needs Assistance Bed Mobility: Rolling;Sidelying to Sit Rolling: Min assist Sidelying to sit: Mod  assist       General bed mobility comments: pt was in recliner pre/post session    Transfers Overall transfer level: Needs assistance Equipment used: Rolling walker (2 wheeled) Transfers: Sit to/from Stand Sit to Stand: Min guard;Min assist         General transfer comment: CGA-min assist to stand from recliner 2 x  Ambulation/Gait Ambulation/Gait assistance: Min assist Gait Distance (Feet): 20 Feet Assistive device: Rolling walker (2 wheeled) Gait Pattern/deviations: Scissoring;Narrow base of support;Staggering left;Staggering right Gait velocity: decreased   General Gait Details: less unsteadiness today however c/o increased pain during ambulation       Balance Overall balance assessment: Needs assistance Sitting-balance support: No upper extremity supported;Feet supported Sitting balance-Leahy Scale: Good Sitting balance - Comments: Able to participate in LB dressing while seated EOB; no LOB observed   Standing balance support: Bilateral upper extremity supported;During functional activity Standing balance-Leahy Scale: Poor Standing balance comment: high fall risk         Cognition Arousal/Alertness: Awake/alert Behavior During Therapy: WFL for tasks assessed/performed Overall Cognitive Status: Within Functional Limits for tasks assessed Area of Impairment: Orientation;Attention;Following commands;Awareness;Safety/judgement;Problem solving;Memory      Orientation Level: Time;Situation Current Attention Level: Alternating Memory: Decreased recall of precautions;Decreased short-term memory Following Commands: Follows one step commands inconsistently Safety/Judgement: Decreased awareness of safety;Decreased awareness of deficits   Problem Solving: Slow processing;Decreased initiation;Requires verbal cues;Requires tactile cues General Comments: Pt is alert and conversational throughout session. does have cognition deficits come to light throughout          General Comments General comments (skin integrity, edema, etc.): assisted orthotist with applying custom clamshell CTLSO  Pertinent Vitals/Pain Pain Assessment: 0-10 Pain Score: 8  Faces Pain Scale: Hurts even more Pain Location: Back Pain Descriptors / Indicators: Guarding;Tender Pain Intervention(s): Limited activity within patient's tolerance;Monitored during session;Premedicated before session;Repositioned           PT Goals (current goals can now be found in the care plan section) Acute Rehab PT Goals Patient Stated Goal: to improve pain Progress towards PT goals: Progressing toward goals    Frequency    7X/week      PT Plan Current plan remains appropriate       AM-PAC PT "6 Clicks" Mobility   Outcome Measure  Help needed turning from your back to your side while in a flat bed without using bedrails?: A Lot Help needed moving from lying on your back to sitting on the side of a flat bed without using bedrails?: A Lot Help needed moving to and from a bed to a chair (including a wheelchair)?: A Lot Help needed standing up from a chair using your arms (e.g., wheelchair or bedside chair)?: A Lot Help needed to walk in hospital room?: A Lot Help needed climbing 3-5 steps with a railing? : A Lot 6 Click Score: 12    End of Session Equipment Utilized During Treatment: Back brace;Gait belt;Other (comment) Activity Tolerance: Patient tolerated treatment well;Patient limited by pain Patient left: in chair;with call bell/phone within reach;with chair alarm set Nurse Communication: Mobility status PT Visit Diagnosis: Other abnormalities of gait and mobility (R26.89);Muscle weakness (generalized) (M62.81);History of falling (Z91.81);Difficulty in walking, not elsewhere classified (R26.2);Pain Pain - part of body:  (mid/upper back)     Time: 1224-4975 PT Time Calculation (min) (ACUTE ONLY): 26 min  Charges:  $Gait Training: 8-22 mins $Therapeutic Activity: 8-22  mins                     Julaine Fusi PTA 04/23/20, 2:24 PM

## 2020-04-23 NOTE — Progress Notes (Signed)
Report called to [facility] at [facility].  EMS has been called for transport.

## 2020-05-05 ENCOUNTER — Ambulatory Visit: Payer: Medicare PPO

## 2020-05-19 ENCOUNTER — Ambulatory Visit: Admission: RE | Admit: 2020-05-19 | Payer: Medicare PPO | Source: Ambulatory Visit

## 2020-06-22 ENCOUNTER — Other Ambulatory Visit: Payer: Self-pay

## 2020-06-22 ENCOUNTER — Inpatient Hospital Stay
Admission: EM | Admit: 2020-06-22 | Discharge: 2020-06-26 | DRG: 872 | Disposition: A | Discharge status: Home Health | Payer: Medicare PPO | Attending: Internal Medicine | Admitting: Internal Medicine

## 2020-06-22 DIAGNOSIS — R6520 Severe sepsis without septic shock: Secondary | ICD-10-CM | POA: Diagnosis present

## 2020-06-22 DIAGNOSIS — G629 Polyneuropathy, unspecified: Secondary | ICD-10-CM | POA: Diagnosis present

## 2020-06-22 DIAGNOSIS — B9620 Unspecified Escherichia coli [E. coli] as the cause of diseases classified elsewhere: Secondary | ICD-10-CM | POA: Diagnosis not present

## 2020-06-22 DIAGNOSIS — A419 Sepsis, unspecified organism: Secondary | ICD-10-CM

## 2020-06-22 DIAGNOSIS — Z888 Allergy status to other drugs, medicaments and biological substances status: Secondary | ICD-10-CM

## 2020-06-22 DIAGNOSIS — R300 Dysuria: Secondary | ICD-10-CM

## 2020-06-22 DIAGNOSIS — Z882 Allergy status to sulfonamides status: Secondary | ICD-10-CM

## 2020-06-22 DIAGNOSIS — A4150 Gram-negative sepsis, unspecified: Secondary | ICD-10-CM | POA: Diagnosis not present

## 2020-06-22 DIAGNOSIS — Z880 Allergy status to penicillin: Secondary | ICD-10-CM | POA: Diagnosis not present

## 2020-06-22 DIAGNOSIS — S22060D Wedge compression fracture of T7-T8 vertebra, subsequent encounter for fracture with routine healing: Secondary | ICD-10-CM

## 2020-06-22 DIAGNOSIS — A4151 Sepsis due to Escherichia coli [E. coli]: Secondary | ICD-10-CM | POA: Diagnosis present

## 2020-06-22 DIAGNOSIS — Z20822 Contact with and (suspected) exposure to covid-19: Secondary | ICD-10-CM | POA: Diagnosis present

## 2020-06-22 DIAGNOSIS — Z79899 Other long term (current) drug therapy: Secondary | ICD-10-CM | POA: Diagnosis not present

## 2020-06-22 DIAGNOSIS — I129 Hypertensive chronic kidney disease with stage 1 through stage 4 chronic kidney disease, or unspecified chronic kidney disease: Secondary | ICD-10-CM | POA: Diagnosis present

## 2020-06-22 DIAGNOSIS — I10 Essential (primary) hypertension: Secondary | ICD-10-CM | POA: Diagnosis not present

## 2020-06-22 DIAGNOSIS — Z90710 Acquired absence of both cervix and uterus: Secondary | ICD-10-CM | POA: Diagnosis not present

## 2020-06-22 DIAGNOSIS — H3530 Unspecified macular degeneration: Secondary | ICD-10-CM | POA: Diagnosis present

## 2020-06-22 DIAGNOSIS — B9689 Other specified bacterial agents as the cause of diseases classified elsewhere: Secondary | ICD-10-CM | POA: Diagnosis present

## 2020-06-22 DIAGNOSIS — G8929 Other chronic pain: Secondary | ICD-10-CM | POA: Diagnosis present

## 2020-06-22 DIAGNOSIS — Z881 Allergy status to other antibiotic agents status: Secondary | ICD-10-CM | POA: Diagnosis not present

## 2020-06-22 DIAGNOSIS — N3090 Cystitis, unspecified without hematuria: Secondary | ICD-10-CM | POA: Diagnosis present

## 2020-06-22 DIAGNOSIS — N183 Chronic kidney disease, stage 3 unspecified: Secondary | ICD-10-CM | POA: Diagnosis present

## 2020-06-22 DIAGNOSIS — M5416 Radiculopathy, lumbar region: Secondary | ICD-10-CM | POA: Diagnosis present

## 2020-06-22 DIAGNOSIS — M546 Pain in thoracic spine: Secondary | ICD-10-CM | POA: Diagnosis not present

## 2020-06-22 DIAGNOSIS — F419 Anxiety disorder, unspecified: Secondary | ICD-10-CM | POA: Diagnosis present

## 2020-06-22 DIAGNOSIS — Z66 Do not resuscitate: Secondary | ICD-10-CM | POA: Diagnosis present

## 2020-06-22 DIAGNOSIS — N390 Urinary tract infection, site not specified: Secondary | ICD-10-CM

## 2020-06-22 DIAGNOSIS — Z96641 Presence of right artificial hip joint: Secondary | ICD-10-CM | POA: Diagnosis present

## 2020-06-22 DIAGNOSIS — E039 Hypothyroidism, unspecified: Secondary | ICD-10-CM | POA: Diagnosis present

## 2020-06-22 DIAGNOSIS — M4854XA Collapsed vertebra, not elsewhere classified, thoracic region, initial encounter for fracture: Secondary | ICD-10-CM | POA: Diagnosis present

## 2020-06-22 DIAGNOSIS — Z79890 Hormone replacement therapy (postmenopausal): Secondary | ICD-10-CM

## 2020-06-22 DIAGNOSIS — F32A Depression, unspecified: Secondary | ICD-10-CM | POA: Diagnosis present

## 2020-06-22 LAB — COMPREHENSIVE METABOLIC PANEL
ALT: 14 U/L (ref 0–44)
AST: 23 U/L (ref 15–41)
Albumin: 3.8 g/dL (ref 3.5–5.0)
Alkaline Phosphatase: 72 U/L (ref 38–126)
Anion gap: 12 (ref 5–15)
BUN: 15 mg/dL (ref 8–23)
CO2: 23 mmol/L (ref 22–32)
Calcium: 9.2 mg/dL (ref 8.9–10.3)
Chloride: 98 mmol/L (ref 98–111)
Creatinine, Ser: 0.95 mg/dL (ref 0.44–1.00)
GFR, Estimated: 58 mL/min — ABNORMAL LOW (ref >60)
Glucose, Bld: 123 mg/dL — ABNORMAL HIGH (ref 70–99)
Potassium: 3.6 mmol/L (ref 3.5–5.1)
Sodium: 133 mmol/L — ABNORMAL LOW (ref 135–145)
Total Bilirubin: 1 mg/dL (ref 0.3–1.2)
Total Protein: 7.2 g/dL (ref 6.5–8.1)

## 2020-06-22 LAB — CBC WITH DIFFERENTIAL/PLATELET
Abs Immature Granulocytes: 0.09 10*3/uL — ABNORMAL HIGH (ref 0.00–0.07)
Basophils Absolute: 0.1 10*3/uL (ref 0.0–0.1)
Basophils Relative: 0 %
Eosinophils Absolute: 0 10*3/uL (ref 0.0–0.5)
Eosinophils Relative: 0 %
HCT: 45.1 % (ref 36.0–46.0)
Hemoglobin: 15.2 g/dL — ABNORMAL HIGH (ref 12.0–15.0)
Immature Granulocytes: 1 %
Lymphocytes Relative: 8 %
Lymphs Abs: 1.5 10*3/uL (ref 0.7–4.0)
MCH: 29.3 pg (ref 26.0–34.0)
MCHC: 33.7 g/dL (ref 30.0–36.0)
MCV: 87.1 fL (ref 80.0–100.0)
Monocytes Absolute: 1.3 10*3/uL — ABNORMAL HIGH (ref 0.1–1.0)
Monocytes Relative: 7 %
Neutro Abs: 15.1 10*3/uL — ABNORMAL HIGH (ref 1.7–7.7)
Neutrophils Relative %: 84 %
Platelets: 218 10*3/uL (ref 150–400)
RBC: 5.18 MIL/uL — ABNORMAL HIGH (ref 3.87–5.11)
RDW: 15 % (ref 11.5–15.5)
WBC: 18 10*3/uL — ABNORMAL HIGH (ref 4.0–10.5)
nRBC: 0 % (ref 0.0–0.2)

## 2020-06-22 LAB — URINALYSIS, COMPLETE (UACMP) WITH MICROSCOPIC
Bilirubin Urine: NEGATIVE
Glucose, UA: NEGATIVE mg/dL
Ketones, ur: NEGATIVE mg/dL
Nitrite: NEGATIVE
Protein, ur: 100 mg/dL — AB
Specific Gravity, Urine: 1.014 (ref 1.005–1.030)
Squamous Epithelial / HPF: NONE SEEN (ref 0–5)
WBC, UA: >50 WBC/hpf — ABNORMAL HIGH (ref 0–5)
pH: 6 (ref 5.0–8.0)

## 2020-06-22 LAB — RESP PANEL BY RT-PCR (FLU A&B, COVID) ARPGX2
Influenza A by PCR: NEGATIVE
Influenza B by PCR: NEGATIVE
SARS Coronavirus 2 by RT PCR: NEGATIVE

## 2020-06-22 LAB — LACTIC ACID, PLASMA
Lactic Acid, Venous: 3.9 mmol/L (ref 0.5–1.9)
Lactic Acid, Venous: 2.7 mmol/L (ref 0.5–1.9)

## 2020-06-22 LAB — LIPASE, BLOOD: Lipase: 33 U/L (ref 11–51)

## 2020-06-22 MED ORDER — SODIUM CHLORIDE 0.9 % IV SOLN
1.0000 g | Freq: Once | INTRAVENOUS | Status: AC
  Administered 2020-06-22: 1 g via INTRAVENOUS
  Filled 2020-06-22: qty 10

## 2020-06-22 MED ORDER — AMLODIPINE BESYLATE 5 MG PO TABS
2.5000 mg | ORAL_TABLET | Freq: Every day | ORAL | Status: DC
  Administered 2020-06-23 – 2020-06-26 (×4): 2.5 mg via ORAL
  Filled 2020-06-22 (×4): qty 1

## 2020-06-22 MED ORDER — SODIUM CHLORIDE 0.9 % IV SOLN: INTRAVENOUS | Status: DC

## 2020-06-22 MED ORDER — IPRATROPIUM-ALBUTEROL 20-100 MCG/ACT IN AERS
2.0000 | INHALATION_SPRAY | Freq: Four times a day (QID) | RESPIRATORY_TRACT | Status: DC | PRN
  Filled 2020-06-22: qty 4

## 2020-06-22 MED ORDER — SODIUM CHLORIDE 0.9 % IV BOLUS
1000.0000 mL | Freq: Once | INTRAVENOUS | Status: AC
  Administered 2020-06-22: 1000 mL via INTRAVENOUS

## 2020-06-22 MED ORDER — TIMOLOL MALEATE 0.5 % OP SOLN
1.0000 [drp] | Freq: Every day | OPHTHALMIC | Status: DC
  Administered 2020-06-23 – 2020-06-26 (×4): 1 [drp] via OPHTHALMIC
  Filled 2020-06-22: qty 5

## 2020-06-22 MED ORDER — METAXALONE 800 MG PO TABS
400.0000 mg | ORAL_TABLET | Freq: Three times a day (TID) | ORAL | Status: DC
  Administered 2020-06-23 – 2020-06-26 (×7): 400 mg via ORAL
  Filled 2020-06-22 (×13): qty 0.5

## 2020-06-22 MED ORDER — SODIUM CHLORIDE 0.9 % IV SOLN: 1.0000 g | INTRAVENOUS | Status: DC

## 2020-06-22 MED ORDER — ONDANSETRON HCL 4 MG/2ML IJ SOLN
4.0000 mg | Freq: Four times a day (QID) | INTRAMUSCULAR | Status: DC | PRN
  Administered 2020-06-23 – 2020-06-26 (×4): 4 mg via INTRAVENOUS
  Filled 2020-06-22 (×4): qty 2

## 2020-06-22 MED ORDER — OXYCODONE HCL 5 MG PO TABS
2.5000 mg | ORAL_TABLET | Freq: Once | ORAL | Status: AC
  Administered 2020-06-22: 2.5 mg via ORAL
  Filled 2020-06-22: qty 1

## 2020-06-22 MED ORDER — GABAPENTIN 400 MG PO CAPS
800.0000 mg | ORAL_CAPSULE | Freq: Two times a day (BID) | ORAL | Status: DC | PRN
  Filled 2020-06-22: qty 2

## 2020-06-22 MED ORDER — CALCITONIN (SALMON) 200 UNIT/ACT NA SOLN
1.0000 | Freq: Every day | NASAL | Status: DC
  Administered 2020-06-23 – 2020-06-26 (×4): 1 via NASAL
  Filled 2020-06-22: qty 3.7

## 2020-06-22 MED ORDER — TRAZODONE HCL 50 MG PO TABS
25.0000 mg | ORAL_TABLET | Freq: Every evening | ORAL | Status: DC | PRN
  Administered 2020-06-22 – 2020-06-23 (×2): 25 mg via ORAL
  Filled 2020-06-22 (×2): qty 1

## 2020-06-22 MED ORDER — OCUVITE-LUTEIN PO CAPS
1.0000 | ORAL_CAPSULE | Freq: Two times a day (BID) | ORAL | Status: DC
  Administered 2020-06-23 – 2020-06-26 (×7): 1 via ORAL
  Filled 2020-06-22 (×9): qty 1

## 2020-06-22 MED ORDER — DULOXETINE HCL 30 MG PO CPEP
30.0000 mg | ORAL_CAPSULE | Freq: Two times a day (BID) | ORAL | Status: DC
  Administered 2020-06-23 – 2020-06-26 (×7): 30 mg via ORAL
  Filled 2020-06-22 (×9): qty 1

## 2020-06-22 MED ORDER — LEVOTHYROXINE SODIUM 50 MCG PO TABS
50.0000 ug | ORAL_TABLET | Freq: Every day | ORAL | Status: DC
  Administered 2020-06-24 – 2020-06-25 (×2): 50 ug via ORAL
  Filled 2020-06-22 (×4): qty 1

## 2020-06-22 MED ORDER — OXYCODONE HCL 5 MG PO TABS
2.5000 mg | ORAL_TABLET | ORAL | Status: DC | PRN
Start: 2020-06-22 — End: 2020-06-26
  Administered 2020-06-23: 2.5 mg via ORAL
  Filled 2020-06-22: qty 1

## 2020-06-22 MED ORDER — ACETAMINOPHEN 325 MG PO TABS
650.0000 mg | ORAL_TABLET | Freq: Four times a day (QID) | ORAL | Status: DC | PRN
  Administered 2020-06-24: 650 mg via ORAL
  Filled 2020-06-22: qty 2

## 2020-06-22 MED ORDER — ACETAMINOPHEN 650 MG RE SUPP
650.0000 mg | Freq: Four times a day (QID) | RECTAL | Status: DC | PRN
Start: 2020-06-22 — End: 2020-06-26

## 2020-06-22 MED ORDER — POLYETHYLENE GLYCOL 3350 17 G PO PACK
17.0000 g | PACK | Freq: Every day | ORAL | Status: DC
  Administered 2020-06-25 – 2020-06-26 (×2): 17 g via ORAL
  Filled 2020-06-22 (×2): qty 1

## 2020-06-22 MED ORDER — ENOXAPARIN SODIUM 40 MG/0.4ML IJ SOSY
40.0000 mg | PREFILLED_SYRINGE | INTRAMUSCULAR | Status: DC
  Administered 2020-06-22 – 2020-06-25 (×4): 40 mg via SUBCUTANEOUS
  Filled 2020-06-22 (×4): qty 0.4

## 2020-06-22 MED ORDER — LATANOPROST 0.005 % OP SOLN
1.0000 [drp] | Freq: Every day | OPHTHALMIC | Status: DC
  Administered 2020-06-23 – 2020-06-25 (×3): 1 [drp] via OPHTHALMIC
  Filled 2020-06-22: qty 2.5

## 2020-06-22 MED ORDER — ONDANSETRON HCL 4 MG PO TABS
4.0000 mg | ORAL_TABLET | Freq: Four times a day (QID) | ORAL | Status: DC | PRN
  Administered 2020-06-25: 4 mg via ORAL
  Filled 2020-06-22: qty 1

## 2020-06-22 MED ORDER — MAGNESIUM HYDROXIDE 400 MG/5ML PO SUSP: 30.0000 mL | Freq: Every day | ORAL | Status: DC | PRN

## 2020-06-22 MED ORDER — DOCUSATE SODIUM 100 MG PO CAPS: 100.0000 mg | ORAL_CAPSULE | Freq: Two times a day (BID) | ORAL | Status: DC | PRN

## 2020-06-22 NOTE — ED Triage Notes (Signed)
Pt presents from [facility], c/o urinary incontinence and burning with urination that started last night.   Pt contact at facility is Deneise Lever [phone].  Tillie Rung, [phone].

## 2020-06-22 NOTE — H&P (Addendum)
[place]   PATIENT NAME: Alexandria Roman    MR#:  [id number]  DATE OF BIRTH:  11-29-1929  DATE OF ADMISSION:  06/22/2020  PRIMARY CARE PHYSICIAN: Adin Hector, MD   Patient is coming from: [facility] independent living.  REQUESTING/REFERRING PHYSICIAN: Brenton Grills, MD CHIEF COMPLAINT:   Chief Complaint  Patient presents with  . Urinary Frequency    HISTORY OF PRESENT ILLNESS:  Alexandria Roman is a 87 y.o. Caucasian female with medical history significant for essential hypertension and cancer, who presented to the emergency room with acute onset urinary frequency and urgency with dysuria that started last night with associated generalized weakness.  Her urinary incontinence has been worse.  She denied nausea or vomiting or abdominal pain.  No fever but she felt shaking chills.  No chest pain or dyspnea or cough or wheezing.  She was having low back pain.  She admits to mild headache without dizziness or blurred vision.  ED Course: Upon presentation to the emergency room heart rate was 110 with otherwise normal vital signs and later blood pressure was 82/48.  BP has responded to IV fluids and is currently up to 127/60 however with persistent tachycardia of 123.  Respiratory rate was later up to 21.  Labs revealed mild hyponatremia with otherwise unremarkable CMP.  Lactic acid was 3.9 and later 2.7 and CBC showed leukocytosis of 18 with neutrophilia.  UA was  positive for UTI.  Blood and urine cultures were drawn.  EKG as reviewed by me : EKG showed sinus tachycardia with a rate of 105.  The patient was given 1 g IV Rocephin, 1 L bolus of IV normal saline and 2.5 mg of p.o. Roxicodone.  She will be admitted to a medical monitored bed for further evaluation and management. PAST MEDICAL HISTORY:   Past Medical History:  Diagnosis Date  . Cancer ([place])   . Hypertension   Chronic low back pain.  PAST SURGICAL HISTORY:   Past Surgical History:  Procedure Laterality  Date  . ABDOMINAL HYSTERECTOMY    . HIP ARTHROPLASTY Right 05/20/2018   Procedure: ARTHROPLASTY BIPOLAR HIP (HEMIARTHROPLASTY);  Surgeon: Thornton Park, MD;  Location: [facility] ORS;  Service: Orthopedics;  Laterality: Right;    SOCIAL HISTORY:   Social History   Tobacco Use  . Smoking status: Never Smoker  . Smokeless tobacco: Never Used  Substance Use Topics  . Alcohol use: No    FAMILY HISTORY:   Family History  Problem Relation Age of Onset  . Testicular cancer Father     DRUG ALLERGIES:   Allergies  Allergen Reactions  . Amoxicillin-Pot Clavulanate Diarrhea  . Cephalexin Other (See Comments) and Nausea And Vomiting  . Hydrochlorothiazide Other (See Comments) and Nausea And Vomiting  . Hydrocodone Bit-Homatrop Mbr Other (See Comments)  . Hydrocodone-Chlorpheniramine Other (See Comments)  . Metronidazole Other (See Comments)    Other reaction(s): Other (See Comments) Other Reaction: Not Assessed Other Reaction: Not Assessed  . Moxifloxacin Other (See Comments)  . Prednisone Nausea Only  . Simvastatin Other (See Comments)  . Sulfa Antibiotics Other (See Comments)    Other reaction(s): Unknown  . Sulfasalazine Other (See Comments)    Other reaction(s): Unknown  . Tramadol Other (See Comments)  . Celecoxib Nausea And Vomiting  . Codeine Nausea Only    Other reaction(s): Other (See Comments) Other reaction(s): Unknown  . Cyclobenzaprine Other (See Comments) and Nausea Only    Other reaction(s): Other (See Comments)  .  Oxycodone-Acetaminophen Nausea Only and Nausea And Vomiting  . Penicillin V Potassium Rash    Other reaction(s): Other (See Comments) Other reaction(s): Unknown    REVIEW OF SYSTEMS:   ROS As per history of present illness. All pertinent systems were reviewed above. Constitutional, HEENT, cardiovascular, respiratory, GI, GU, musculoskeletal, neuro, psychiatric, endocrine, integumentary and hematologic systems were reviewed and are otherwise  negative/unremarkable except for positive findings mentioned above in the HPI.   MEDICATIONS AT HOME:   Prior to Admission medications   Medication Sig Start Date End Date Taking? Authorizing Provider  amLODipine (NORVASC) 2.5 MG tablet Take 1 tablet (2.5 mg total) by mouth daily. 04/24/20   Sharen Hones, MD  calcitonin, salmon, (MIACALCIN/FORTICAL) 200 UNIT/ACT nasal spray Place 1 spray into alternate nostrils daily. 04/23/20   Sharen Hones, MD  COMBIVENT RESPIMAT 20-100 MCG/ACT AERS respimat Inhale 2 puffs into the lungs in the morning, at noon, and at bedtime. 05/03/19   [provider]  docusate sodium (COLACE) 100 MG capsule Take 100 mg by mouth as needed for constipation.    [provider]  DULoxetine (CYMBALTA) 30 MG capsule Take 1 capsule by mouth 2 (two) times daily. 11/03/16   [provider]  gabapentin (NEURONTIN) 400 MG capsule Take 800 mg by mouth 2 (two) times daily as needed for pain. 03/27/20   [provider]  latanoprost (XALATAN) 0.005 % ophthalmic solution  02/12/20   [provider]  levothyroxine (SYNTHROID) 50 MCG tablet Take 50 mcg by mouth daily before breakfast. 01/17/20   [provider]  metaxalone (SKELAXIN) 800 MG tablet Take 400 mg by mouth 3 (three) times daily. 04/16/20   [provider]  Multiple Vitamins-Minerals (PRESERVISION/LUTEIN) CAPS Take 1 capsule by mouth 2 (two) times daily.    [provider]  oxyCODONE (OXY IR/ROXICODONE) 5 MG immediate release tablet Take 0.5 tablets (2.5 mg total) by mouth every 4 (four) hours as needed for moderate pain or severe pain. 04/23/20   Sharen Hones, MD  polyethylene glycol (MIRALAX / GLYCOLAX) 17 g packet Take 17 g by mouth daily. 04/24/20   Sharen Hones, MD  timolol (TIMOPTIC) 0.5 % ophthalmic solution SMARTSIG:In Eye(s) 12/07/19   [provider]  enoxaparin (LOVENOX) 40 MG/0.4ML injection Inject 0.4 mLs (40 mg total) into the skin daily for 28  days. 05/24/18 05/13/19  Demetrios Loll, MD  losartan (COZAAR) 25 MG tablet Take 1 tablet (25 mg total) by mouth daily. 05/23/18 05/13/19  Demetrios Loll, MD  metoprolol succinate (TOPROL-XL) 25 MG 24 hr tablet Take 1 tablet by mouth daily. 03/21/19 01/28/20  [provider]      VITAL SIGNS:  Blood pressure 127/60, pulse (!) 124, temperature 98.6 F (37 C), temperature source Oral, resp. rate 19, height 5\' 2"  (1.575 m), weight 63.5 kg, SpO2 95 %.  PHYSICAL EXAMINATION:  Physical Exam  GENERAL:  87 y.o.-year-old Caucasian female patient lying in the bed with no acute distress.  EYES: Pupils equal, round, reactive to light and accommodation. No scleral icterus. Extraocular muscles intact.  HEENT: Head atraumatic, normocephalic. Oropharynx and nasopharynx clear.  NECK:  Supple, no jugular venous distention. No thyroid enlargement, no tenderness.  LUNGS: Normal breath sounds bilaterally, no wheezing, rales,rhonchi or crepitation. No use of accessory muscles of respiration.  CARDIOVASCULAR: Regular rate and rhythm, S1, S2 normal. No murmurs, rubs, or gallops.  ABDOMEN: Soft, nondistended, with minimal suprapubic tenderness without rebound tenderness guarding or rigidity. Bowel sounds present. No organomegaly or mass.  EXTREMITIES: No  pedal edema, cyanosis, or clubbing.  NEUROLOGIC: Cranial nerves II through XII are intact. Muscle strength 5/5 in all extremities. Sensation intact. Gait not checked.  PSYCHIATRIC: The patient is alert and oriented x 3.  Normal affect and good eye contact. SKIN: No obvious rash, lesion, or ulcer.   LABORATORY PANEL:   CBC Recent Labs  Lab 06/22/20 1546  WBC 18.0*  HGB 15.2*  HCT 45.1  PLT 218   ------------------------------------------------------------------------------------------------------------------  Chemistries  Recent Labs  Lab 06/22/20 1546  NA 133*  K 3.6  CL 98  CO2 23  GLUCOSE 123*  BUN 15  CREATININE 0.95  CALCIUM 9.2  AST 23  ALT  14  ALKPHOS 72  BILITOT 1.0   ------------------------------------------------------------------------------------------------------------------  Cardiac Enzymes No results for input(s): TROPONINI in the last 168 hours. ------------------------------------------------------------------------------------------------------------------  RADIOLOGY:  No results found.    IMPRESSION AND PLAN:  Active Problems:   Sepsis due to gram-negative UTI ([place])  1.  Sepsis due to UTI.  This is manifested by tachycardia and tachypnea with leukocytosis.  She meets severe sepsis criteria given lactic acid level of 3.9 initially. - The patient be admitted to a medical monitored bed. - We will continue IV antibiotic therapy with IV Rocephin. - We will follow urine and blood cultures. - The patient will be aggressive hydrated with IV normal saline.  2.  Low back pain with history of lumbar radiculitis. - Pain management will be provided as well as muscle relaxants. - Physical therapy consult will be obtained.  3.  Essential hypertension. - We will continue Norvasc.  3.  Hypothyroidism. - We will continue Synthroid.  4.  Peripheral neuropathy. - We will continue Neurontin.  5.  Anxiety and depression. - We will continue Cymbalta and Xanax..   DVT prophylaxis: Lovenox. Code Status: The patient is DNR/DNI.  I discussed the CODE STATUS with her and she clearly indicated her wishes. Family Communication:  The plan of care was discussed in details with the patient (and family). I answered all questions. The patient agreed to proceed with the above mentioned plan. Further management will depend upon hospital course. Disposition Plan: Back to previous home environment Consults called: none. All the records are reviewed and case discussed with ED provider.  Status is: Inpatient  Remains inpatient appropriate because:Ongoing diagnostic testing needed not appropriate for outpatient work up, Unsafe  d/c plan, IV treatments appropriate due to intensity of illness or inability to take PO and Inpatient level of care appropriate due to severity of illness   Dispo: The patient is from:  [facility] independent living              Anticipated d/c is to:  [facility]              Patient currently is not medically stable to d/c.   Difficult to place patient No   TOTAL TIME TAKING CARE OF THIS PATIENT: 55 minutes.    Christel Mormon M.D on 06/22/2020 at 7:40 PM  [facility]   From 7 PM-7 AM, contact night-coverage [email or web address]  CC: Primary care physician; Adin Hector, MD

## 2020-06-22 NOTE — ED Provider Notes (Signed)
[facility] Emergency Department Provider Note   ____________________________________________   Event Date/Time   First MD Initiated Contact with Patient 06/22/20 1258     (approximate)  I have reviewed the triage vital signs and the nursing notes.   HISTORY  Chief Complaint Urinary Frequency    HPI Alexandria Roman is a 87 y.o. female with below stated past medical history presents from [facility] independent living complaining of urinary incontinence with a burning sensation that began last night and is associated with generalized weakness.  Patient denies any exacerbating or relieving factors for the symptoms.  Patient is a tried any medications to alleviate the symptoms.  Patient currently denies any vision changes, tinnitus, difficulty speaking, facial droop, sore throat, chest pain, shortness of breath, abdominal pain, nausea/vomiting/diarrhea, or weakness/numbness/paresthesias in any extremity         Past Medical History:  Diagnosis Date  . Cancer ([place])   . Hypertension     Patient Active Problem List   Diagnosis Date Noted  . Nocturnal hypoxia   . Weakness   . Neuropathy   . Glaucoma of both eyes   . Thoracic compression fracture ([place]) 04/19/2020  . Hip fracture ([place]) 05/19/2018  . Depression with anxiety 12/09/2016  . Numbness of foot 12/09/2016  . Macular degeneration 12/09/2016  . Gait instability 12/08/2016  . Upper respiratory infection, viral 12/06/2016  . Primary osteoarthritis of both knees 07/20/2016  . Postmenopausal osteoporosis 04/15/2016  . Chest pain 03/19/2016  . Spinal stenosis 08/04/2015  . Myalgia 06/11/2015  . Polyarthralgia 06/11/2015  . Chronic midline low back pain with bilateral sciatica 01/10/2015  . Essential hypertension 06/04/2014  . CKD (chronic kidney disease) stage 3, GFR 30-59 ml/min (HCC) 06/04/2014  . Mixed hyperlipidemia 06/04/2014  . Other allergic rhinitis 06/04/2014  . DDD (degenerative disc  disease), lumbar 09/07/2013  . Lumbar radiculitis 09/07/2013  . Hypothyroidism, unspecified 08/02/2013    Past Surgical History:  Procedure Laterality Date  . ABDOMINAL HYSTERECTOMY    . HIP ARTHROPLASTY Right 05/20/2018   Procedure: ARTHROPLASTY BIPOLAR HIP (HEMIARTHROPLASTY);  Surgeon: Thornton Park, MD;  Location: [facility] ORS;  Service: Orthopedics;  Laterality: Right;    Prior to Admission medications   Medication Sig Start Date End Date Taking? Authorizing Provider  amLODipine (NORVASC) 2.5 MG tablet Take 1 tablet (2.5 mg total) by mouth daily. 04/24/20   Sharen Hones, MD  calcitonin, salmon, (MIACALCIN/FORTICAL) 200 UNIT/ACT nasal spray Place 1 spray into alternate nostrils daily. 04/23/20   Sharen Hones, MD  COMBIVENT RESPIMAT 20-100 MCG/ACT AERS respimat Inhale 2 puffs into the lungs in the morning, at noon, and at bedtime. 05/03/19   [provider]  docusate sodium (COLACE) 100 MG capsule Take 100 mg by mouth as needed for constipation.    [provider]  DULoxetine (CYMBALTA) 30 MG capsule Take 1 capsule by mouth 2 (two) times daily. 11/03/16   [provider]  gabapentin (NEURONTIN) 400 MG capsule Take 800 mg by mouth 2 (two) times daily as needed for pain. 03/27/20   [provider]  latanoprost (XALATAN) 0.005 % ophthalmic solution  02/12/20   [provider]  levothyroxine (SYNTHROID) 50 MCG tablet Take 50 mcg by mouth daily before breakfast. 01/17/20   [provider]  metaxalone (SKELAXIN) 800 MG tablet Take 400 mg by mouth 3 (three) times daily. 04/16/20   [provider]  Multiple Vitamins-Minerals (PRESERVISION/LUTEIN) CAPS Take 1 capsule by mouth 2 (two) times daily.  [provider]  oxyCODONE (OXY IR/ROXICODONE) 5 MG immediate release tablet Take 0.5 tablets (2.5 mg total) by mouth every 4 (four) hours as needed for moderate pain or severe pain. 04/23/20   Sharen Hones, MD  polyethylene glycol (MIRALAX /  GLYCOLAX) 17 g packet Take 17 g by mouth daily. 04/24/20   Sharen Hones, MD  timolol (TIMOPTIC) 0.5 % ophthalmic solution SMARTSIG:In Eye(s) 12/07/19   [provider]  enoxaparin (LOVENOX) 40 MG/0.4ML injection Inject 0.4 mLs (40 mg total) into the skin daily for 28 days. 05/24/18 05/13/19  Demetrios Loll, MD  losartan (COZAAR) 25 MG tablet Take 1 tablet (25 mg total) by mouth daily. 05/23/18 05/13/19  Demetrios Loll, MD  metoprolol succinate (TOPROL-XL) 25 MG 24 hr tablet Take 1 tablet by mouth daily. 03/21/19 01/28/20  [provider]    Allergies Amoxicillin-pot clavulanate, Cephalexin, Hydrochlorothiazide, Hydrocodone bit-homatrop mbr, Hydrocodone-chlorpheniramine, Metronidazole, Moxifloxacin, Prednisone, Simvastatin, Sulfa antibiotics, Sulfasalazine, Tramadol, Celecoxib, Codeine, Cyclobenzaprine, Oxycodone-acetaminophen, and Penicillin v potassium  Family History  Problem Relation Age of Onset  . Testicular cancer Father     Social History Social History   Tobacco Use  . Smoking status: Never Smoker  . Smokeless tobacco: Never Used  Vaping Use  . Vaping Use: Never used  Substance Use Topics  . Alcohol use: No  . Drug use: No    Review of Systems Constitutional: No fever/chills Eyes: No visual changes. ENT: No sore throat. Cardiovascular: Denies chest pain. Respiratory: Denies shortness of breath. Gastrointestinal: No abdominal pain.  No nausea, no vomiting.  No diarrhea. Genitourinary: Positive for dysuria and urinary incontinence Musculoskeletal: Negative for acute arthralgias Skin: Negative for rash. Neurological: Negative for headaches, weakness/numbness/paresthesias in any extremity Psychiatric: Negative for suicidal ideation/homicidal ideation   ____________________________________________   PHYSICAL EXAM:  VITAL SIGNS: ED Triage Vitals  Enc Vitals Group     BP 06/22/20 1300 127/73     Pulse Rate 06/22/20 1300 (!) 110     Resp 06/22/20 1300 17     Temp  06/22/20 1300 98.6 F (37 C)     Temp Source 06/22/20 1300 Oral     SpO2 06/22/20 1300 96 %     Weight 06/22/20 1303 140 lb (63.5 kg)     Height 06/22/20 1303 5\' 2"  (1.575 m)     Head Circumference --      Peak Flow --      Pain Score 06/22/20 1303 0     Pain Loc --      Pain Edu? --      Excl. in [place]? --    Constitutional: Alert and oriented. Well appearing and in no acute distress. Eyes: Conjunctivae are normal. PERRL. Head: Atraumatic. Nose: No congestion/rhinnorhea. Mouth/Throat: Mucous membranes are moist. Neck: No stridor Cardiovascular: Grossly normal heart sounds.  Good peripheral circulation. Respiratory: Normal respiratory effort.  No retractions. Gastrointestinal: Soft and nontender. No distention. Musculoskeletal: No obvious deformities Neurologic:  Normal speech and language. No gross focal neurologic deficits are appreciated. Skin:  Skin is warm and dry. No rash noted. Psychiatric: Mood and affect are normal. Speech and behavior are normal.  ____________________________________________   LABS (all labs ordered are listed, but only abnormal results are displayed)  Labs Reviewed  COMPREHENSIVE METABOLIC PANEL  LIPASE, BLOOD  LACTIC ACID, PLASMA  LACTIC ACID, PLASMA  CBC WITH DIFFERENTIAL/PLATELET  URINALYSIS, COMPLETE (UACMP) WITH MICROSCOPIC   ____________________________________________  EKG  ED ECG REPORT I, Naaman Plummer, the attending physician, personally viewed and interpreted this  ECG.  Date: 06/22/2020 EKG Time: 1341 Rate: 105 Rhythm: Tachycardic sinus rhythm QRS Axis: normal Intervals: normal ST/T Wave abnormalities: normal Narrative Interpretation: no evidence of acute ischemia   PROCEDURES  Procedure(s) performed (including Critical Care):  Procedures   ____________________________________________   INITIAL IMPRESSION / ASSESSMENT AND PLAN / ED COURSE  As part of my medical decision making, I reviewed the following data  within the [place] notes reviewed and incorporated, Labs reviewed, EKG interpreted, Old chart reviewed, Radiograph reviewed and Notes from prior ED visits reviewed and incorporated        Not Pregnant. Unlikely TOA, Ovarian Torsion, PID, gonorrhea/chlamydia. Low suspicion for Infected Urolithiasis, AAA, Cholecystitis, Pancreatitis, SBO, Appendicitis, or other acute abdomen.  Disposition: Care of this patient will be signed out to the oncoming physician at the end of my shift.  All pertinent patient information conveyed and all questions answered.  All further care and disposition decisions will be made by the oncoming physician.  Clinical Course as of 06/24/20 2122  Sun Jun 22, 2020  1705 Urinalysis resulted at 4:32 PM which shows a UTI.  Labs also show leukocytosis and elevated lactic acid level, establishing a diagnosis of sepsis at 4:32 PM.  Will obtain blood cultures, give Rocephin, trend lactate. [PS]  4825 Unable to obtain blood cultures due to challenging IV access and pt unwilling to be stuck again. Will give antibiotics without cultures to avoid delay in care. [PS]    Clinical Course User Index [PS] Carrie Mew, MD     ____________________________________________   FINAL CLINICAL IMPRESSION(S) / ED DIAGNOSES  Final diagnoses:  Dysuria     ED Discharge Orders    None       Note:  This document was prepared using Dragon voice recognition software and may include unintentional dictation errors.   Naaman Plummer, MD 06/24/20 [phone]

## 2020-06-22 NOTE — ED Notes (Signed)
Pt ambulatory to bedside toilet with standby assist, steady gait. Reports the urge to urinate but is unable to at this time. Assisted back to bed, purewick placed. Stretcher locked in low position, side rails up x2, call light in reach.

## 2020-06-22 NOTE — ED Notes (Signed)
Lactate obtained, lab tech called to obtain blood cultures.

## 2020-06-22 NOTE — ED Provider Notes (Signed)
.  Critical Care Performed by: Carrie Mew, MD Authorized by: Carrie Mew, MD   Critical care provider statement:    Critical care time (minutes):  33   Critical care time was exclusive of:  Separately billable procedures and treating other patients   Critical care was necessary to treat or prevent imminent or life-threatening deterioration of the following conditions:  Sepsis   Critical care was time spent personally by me on the following activities:  Development of treatment plan with patient or surrogate, discussions with consultants, evaluation of patient's response to treatment, examination of patient, obtaining history from patient or surrogate, ordering and performing treatments and interventions, ordering and review of laboratory studies, ordering and review of radiographic studies, pulse oximetry, re-evaluation of patient's condition and review of old charts Comments:        .1-3 Lead EKG Interpretation Performed by: Carrie Mew, MD Authorized by: Carrie Mew, MD     Interpretation: abnormal     ECG rate:  130   ECG rate assessment: tachycardic     Rhythm: sinus rhythm     Ectopy: none     Conduction: normal      Clinical Course as of 06/22/20 1943  Sun Jun 22, 2020  1705 Urinalysis resulted at 4:32 PM which shows a UTI.  Labs also show leukocytosis and elevated lactic acid level, establishing a diagnosis of sepsis at 4:32 PM.  Will obtain blood cultures, give Rocephin, trend lactate. [PS]  3329 Unable to obtain blood cultures due to challenging IV access and pt unwilling to be stuck again. Will give antibiotics without cultures to avoid delay in care. [PS]    Clinical Course User Index [PS] Carrie Mew, MD    ----------------------------------------- 7:43 PM on 06/22/2020 -----------------------------------------  Patient persistently tachycardic.  Will give additional 1 L IV fluid bolus.  On the monitor she is in sinus tachycardia.  With  initial lactate of 3.9, leukocytosis, age, will plan to admit for initial management due to sepsis   Carrie Mew, MD 06/22/20 1944

## 2020-06-22 NOTE — ED Notes (Signed)
Spoke to patient's stepdaughter Cherly Anderson who patient states it is okay to give pertinent information to, phone [phone].

## 2020-06-22 NOTE — ED Notes (Signed)
Lab tech at bedside drawing blood cultures, antibiotics hung prior to blood cultures being drawn due to pt being a hard stick and per Dr Jerene Canny order to not delay antibiotic therapy.

## 2020-06-22 NOTE — ED Notes (Signed)
Called lab to obtain ordered blood work. Peripheral IV in right hand flushes without difficulty or pain, no edema observed, however will not draw back blood.

## 2020-06-22 NOTE — ED Notes (Signed)
2nd RN attempted to draw labs from left antecubital without success. Still waiting on lab tech. Dr Cheri Fowler notified.

## 2020-06-23 DIAGNOSIS — A419 Sepsis, unspecified organism: Secondary | ICD-10-CM

## 2020-06-23 DIAGNOSIS — M546 Pain in thoracic spine: Secondary | ICD-10-CM

## 2020-06-23 DIAGNOSIS — B9620 Unspecified Escherichia coli [E. coli] as the cause of diseases classified elsewhere: Secondary | ICD-10-CM

## 2020-06-23 DIAGNOSIS — G8929 Other chronic pain: Secondary | ICD-10-CM

## 2020-06-23 DIAGNOSIS — N390 Urinary tract infection, site not specified: Secondary | ICD-10-CM

## 2020-06-23 DIAGNOSIS — N3090 Cystitis, unspecified without hematuria: Secondary | ICD-10-CM

## 2020-06-23 LAB — BLOOD CULTURE ID PANEL (REFLEXED) - BCID2

## 2020-06-23 LAB — CBC
HCT: 40 % (ref 36.0–46.0)
Hemoglobin: 13 g/dL (ref 12.0–15.0)
MCH: 29.3 pg (ref 26.0–34.0)
MCHC: 32.5 g/dL (ref 30.0–36.0)
MCV: 90.3 fL (ref 80.0–100.0)
Platelets: 216 10*3/uL (ref 150–400)
RBC: 4.43 MIL/uL (ref 3.87–5.11)
RDW: 15.1 % (ref 11.5–15.5)
WBC: 18.2 10*3/uL — ABNORMAL HIGH (ref 4.0–10.5)
nRBC: 0 % (ref 0.0–0.2)

## 2020-06-23 LAB — PROCALCITONIN: Procalcitonin: 23.05 ng/mL

## 2020-06-23 LAB — BASIC METABOLIC PANEL
Anion gap: 8 (ref 5–15)
BUN: 16 mg/dL (ref 8–23)
CO2: 23 mmol/L (ref 22–32)
Calcium: 8 mg/dL — ABNORMAL LOW (ref 8.9–10.3)
Chloride: 105 mmol/L (ref 98–111)
Creatinine, Ser: 0.91 mg/dL (ref 0.44–1.00)
GFR, Estimated: >60 mL/min (ref >60)
Glucose, Bld: 124 mg/dL — ABNORMAL HIGH (ref 70–99)
Potassium: 3.8 mmol/L (ref 3.5–5.1)
Sodium: 136 mmol/L (ref 135–145)

## 2020-06-23 LAB — PROTIME-INR
INR: 1.2 (ref 0.8–1.2)
Prothrombin Time: 14.7 seconds (ref 11.4–15.2)

## 2020-06-23 LAB — CORTISOL-AM, BLOOD: Cortisol - AM: 16.7 ug/dL (ref 6.7–22.6)

## 2020-06-23 MED ORDER — SODIUM CHLORIDE 0.9 % IV SOLN
2.0000 g | INTRAVENOUS | Status: DC
  Administered 2020-06-23 – 2020-06-25 (×3): 2 g via INTRAVENOUS
  Filled 2020-06-23: qty 20
  Filled 2020-06-23 (×2): qty 2

## 2020-06-23 NOTE — Progress Notes (Signed)
PHARMACY - PHYSICIAN COMMUNICATION CRITICAL VALUE ALERT - BLOOD CULTURE IDENTIFICATION (BCID)  2 of 2 aerobic bottles (2 sets) positive with E. Coli, no resistance detected. Pt currently on Ceftriaxone 1 gm. Abx Guidelines recommend increasing to Ceftriaxone 2 gm.  Name of providers contacted: S. Posey Pronto, MD, Marcy Siren, PharmD  Changes to prescribed antibiotics required: Ceftriaxone increased to 2 mg q24h.  Renda Rolls, PharmD, [facility] 06/23/2020 6:38 AM

## 2020-06-23 NOTE — Progress Notes (Signed)
[place] [facility] [place] NAME: Alexandria Roman    MR#:  [id number]  DATE OF BIRTH:  09-15-1929  SUBJECTIVE:  came in with symptoms of frequency and dysuria. Had some chills. No family in the room. Seems to be doing little bit better. She feels uncomfortable in her bed. Tells me her son may not be able to come see her  REVIEW OF SYSTEMS:   Review of Systems  Constitutional: Negative for chills, fever and weight loss.  HENT: Negative for ear discharge, ear pain and nosebleeds.   Eyes: Negative for blurred vision, pain and discharge.  Respiratory: Negative for sputum production, shortness of breath, wheezing and stridor.   Cardiovascular: Negative for chest pain, palpitations, orthopnea and PND.  Gastrointestinal: Negative for abdominal pain, diarrhea, nausea and vomiting.  Genitourinary: Positive for dysuria and frequency. Negative for urgency.  Musculoskeletal: Positive for back pain. Negative for joint pain.  Neurological: Negative for sensory change, speech change, focal weakness and weakness.  Psychiatric/Behavioral: Negative for depression and hallucinations. The patient is not nervous/anxious.    Tolerating Diet:yes Tolerating PT:   DRUG ALLERGIES:   Allergies  Allergen Reactions  . Amoxicillin-Pot Clavulanate Diarrhea  . Cephalexin Other (See Comments) and Nausea And Vomiting  . Hydrochlorothiazide Other (See Comments) and Nausea And Vomiting  . Hydrocodone Bit-Homatrop Mbr Other (See Comments)  . Hydrocodone-Chlorpheniramine Other (See Comments)  . Metronidazole Other (See Comments)    Other reaction(s): Other (See Comments) Other Reaction: Not Assessed Other Reaction: Not Assessed  . Moxifloxacin Other (See Comments)  . Prednisone Nausea Only  . Simvastatin Other (See Comments)  . Sulfa Antibiotics Other (See Comments)    Other reaction(s): Unknown  . Sulfasalazine Other (See Comments)    Other reaction(s): Unknown  . Tramadol  Other (See Comments)  . Celecoxib Nausea And Vomiting  . Codeine Nausea Only    Other reaction(s): Other (See Comments) Other reaction(s): Unknown  . Cyclobenzaprine Other (See Comments) and Nausea Only    Other reaction(s): Other (See Comments)  . Oxycodone-Acetaminophen Nausea Only and Nausea And Vomiting  . Penicillin V Potassium Rash    Other reaction(s): Other (See Comments) Other reaction(s): Unknown    VITALS:  Blood pressure 118/81, pulse 91, temperature 98.6 F (37 C), temperature source Oral, resp. rate 16, height 5\' 4"  (1.626 m), weight 68.4 kg, SpO2 94 %.  PHYSICAL EXAMINATION:   Physical Exam  GENERAL:  87 y.o.-year-old patient lying in the bed with no acute distress.  LUNGS: Normal breath sounds bilaterally, no wheezing, rales, rhonchi. No use of accessory muscles of respiration.  CARDIOVASCULAR: S1, S2 normal. No murmurs, rubs, or gallops.  ABDOMEN: Soft, nontender, nondistended. Bowel sounds present. No organomegaly or mass.  EXTREMITIES: No cyanosis, clubbing or edema b/l.    NEUROLOGIC: Cranial nerves II through XII are intact. No focal Motor or sensory deficits b/l.   PSYCHIATRIC:  patient is alert and oriented x 3.  SKIN: No obvious rash, lesion, or ulcer.   LABORATORY PANEL:  CBC Recent Labs  Lab 06/23/20 0359  WBC 18.2*  HGB 13.0  HCT 40.0  PLT 216    Chemistries  Recent Labs  Lab 06/22/20 1546 06/23/20 0359  NA 133* 136  K 3.6 3.8  CL 98 105  CO2 23 23  GLUCOSE 123* 124*  BUN 15 16  CREATININE 0.95 0.91  CALCIUM 9.2 8.0*  AST 23  --   ALT 14  --   ALKPHOS 72  --  BILITOT 1.0  --    Cardiac Enzymes No results for input(s): TROPONINI in the last 168 hours. RADIOLOGY:  No results found. ASSESSMENT AND PLAN:  CECILEY BUIST is a 87 y.o. Caucasian female with medical history significant for essential hypertension and cancer, who presented to the emergency room with acute onset urinary frequency and urgency with dysuria that started  last night with associated generalized weakness.  Her urinary incontinence has been worse.  She denied nausea or vomiting or abdominal pain.  No fever but she felt shaking chills  Sepsis due to UTI--POA Found to have tachycardia and tachypnea with leukocytosis.  She meets severe sepsis criteria given lactic acid level of 3.9 initially. -- BC ID positive for E. Coli-- continue IV Rocephin  -- follow-up labs and lactic acid  -- trending lactic acid down. Came in with 3.9 --- down to 2.7  -- white count 18.2. Pro calcitonin 23.05   Low back pain with history of lumbar radiculitis. T-1 T8 compression fracture - Pain management will be provided as well as muscle relaxants. -  PT is unable to work with her. Patient requires three LSO brace where all the time when out of bed. It seems patient has not compliant to wearing the brace at her apartment. She apparently does not have one and does not want [place] to order a new one since she does not want to get charged for it. -- Discussed with neurology Dr. Lacinda Axon recommends patient would require to wear the brace while out of bed. -- Patient denies any recent falls. She usually walks with walker at home   Essential hypertension. - continue Norvasc.  Hypothyroidism. - continue Synthroid.   Peripheral neuropathy. -  continue Neurontin.   Anxiety and depression. - continue Cymbalta and Xanax..   DVT prophylaxis: Lovenox. Code Status: The patient is DNR/DNI.  I discussed the CODE STATUS with her and she clearly indicated her wishes. Family communication :none today   Level of care: Med-Surg Status is: Inpatient  Remains inpatient appropriate because:Inpatient level of care appropriate due to severity of illness   Dispo: The patient is from: Home              Anticipated d/c is to: Homeindependent apt              Patient currently is not medically stable to d/c.   Difficult to place patient No    Patient is being treated for E. coli  sepsis. If continues to show improvement discharge in 1 to 2 days back to independent apartment .  TOTAL TIME TAKING CARE OF THIS PATIENT: 25 minutes.  >50% time spent on counselling and coordination of care  Note: This dictation was prepared with Dragon dictation along with smaller phrase technology. Any transcriptional errors that result from this process are unintentional.  Fritzi Mandes M.D    [facility]   CC: Primary care physician; Adin Hector, MDPatient ID: Mare Loan, female   DOB: July 18, 1929, 87 y.o.   MRN: [id number]

## 2020-06-23 NOTE — Progress Notes (Signed)
PT Cancellation Note  Patient Details Name: Alexandria Roman MRN: [id number] DOB: 1929-10-25   Cancelled Treatment:    Reason Eval/Treat Not Completed: Medical issues which prohibited therapy (Consult received and chart reviewed.  Per chart review, patient with T1 and T8 compression fractures (04/2020); orders on previous admission for TSLO when OOB.  Discussed with attending and neuro; prefer for brace to remain with OOB activities.  Will contact facility/family to request brace be brought to hospital for safe PT eval.)   [name] H. Owens Shark, PT, DPT, NCS 06/23/20, 11:05 AM [phone]

## 2020-06-23 NOTE — Progress Notes (Signed)
Assumed care of pt, reviewed am assessment and agree as documented. Pt denies any immediate needs at this time, nad noted and will continue to monitor.

## 2020-06-24 LAB — LACTIC ACID, PLASMA: Lactic Acid, Venous: 1.4 mmol/L (ref 0.5–1.9)

## 2020-06-24 MED ORDER — DM-GUAIFENESIN ER 30-600 MG PO TB12
1.0000 | ORAL_TABLET | Freq: Two times a day (BID) | ORAL | Status: DC
  Administered 2020-06-24 – 2020-06-26 (×5): 1 via ORAL
  Filled 2020-06-24 (×5): qty 1

## 2020-06-24 NOTE — Evaluation (Signed)
Physical Therapy Evaluation Patient Details Name: Alexandria Roman MRN: [id number] DOB: Nov 29, 1929 Today's Date: 06/24/2020   History of Present Illness  Pt is an 87 y.o. female presenting to hospital 5/8 with acute onset urinary frequency and urgency with dysuria associated with generalized weakness.  Pt admitted with sepsis d/t UTI and LBP with h/o lumbar radiculitis.  H/o fall in March 2022 with imaging showing at that time T1 and T8 compression fx's (per prior hospitalization (per neurosurgery) no indication for surgery; recommending brace (TLSO)).  PMH includes CA, htn, R hip hemiarthroplasty, peripheral neuropathy.  Clinical Impression  Prior to hospital admission, pt was ambulatory without AD (pt reports she does not wear her back brace unless she goes out and then a nurse will put it on for her); lives alone at [facility].  Therapist assisted pt with donning CTLSO brace in sitting (no front piece of CTO extension noted--pt reports she does not remember it ever being there and pt reporting she did not remove it).  After therapy session, therapist looked further into pt's previous hospitalization and per prior notes (from last hospitalization), Neurosurgery initially recommending just a TLSO but d/t pt's c/o continued pain a CTO extension was recommended.  Currently pt is SBA with bed mobility; CGA with transfers; and CGA with ambulating 40 feet with RW (limited distance walking d/t pt reporting she did not want to wear her brace anymore--pt educated on importance of wearing brace d/t Neurosurgery still recommending pt wear brace when OOB).  7/10 mid back pain beginning and end of session at rest.  CTLSO brace removed end of session and pt positioned in bed to improve comfort.  Pt would benefit from skilled PT to address noted impairments and functional limitations (see below for any additional details).  Upon hospital discharge, pt would benefit from [place].  Discussed brace concerns  with pt who was interested in having a better brace fit to make it more comfortable to wear.  With pt's permission, therapist called Ronalee Belts from [employer] (company that issued brace to pt) to discuss fit of brace and Ronalee Belts reports someone had to physically unscrew front piece of CTO extension in order to remove and suggested checking to see if anyone could find front piece of CTO extension at pt's home (nurse reports home health nurse brought brace in yesterday) and then they could re-attach piece and check brace fit; therapist discussed this with TOC who was going to call facility anyway and would ask about front piece of brace (pt agreeable to having someone check home for missing brace piece).    Follow Up Recommendations Home health PT    Equipment Recommendations  Rolling walker with 5" wheels    Recommendations for Other Services OT consult     Precautions / Restrictions Precautions Precautions: Fall;Back Required Braces or Orthoses: Spinal Brace Spinal Brace: Thoracolumbosacral orthotic;Other (comment);Applied in supine position;Applied in sitting position Spinal Brace Comments: TLSO (with CTO extension)--brace on when OOB d/t recent h/o T1 and T8 fx's Restrictions Weight Bearing Restrictions: No      Mobility  Bed Mobility Overal bed mobility: Needs Assistance Bed Mobility: Rolling;Sidelying to Sit;Sit to Sidelying Rolling: Supervision Sidelying to sit: Supervision;HOB elevated     Sit to sidelying: Supervision;HOB elevated General bed mobility comments: mild increased effort to perform on own    Transfers Overall transfer level: Needs assistance Equipment used: Rolling walker (2 wheeled) Transfers: Sit to/from Stand Sit to Stand: Min guard  General transfer comment: x2 trials standing from bed and x1 trial standing from recliner; fairly strong stand and controlled descent sitting noted; steady  Ambulation/Gait Ambulation/Gait assistance: Min  guard Gait Distance (Feet): 40 Feet Assistive device: Rolling walker (2 wheeled) Gait Pattern/deviations: Step-through pattern Gait velocity: decreased   General Gait Details: steady ambulation with RW; limited distance per pt request (d/t pt not wanting to wear brace anymore)  Stairs            Wheelchair Mobility    Modified Rankin (Stroke Patients Only)       Balance Overall balance assessment: Needs assistance Sitting-balance support: No upper extremity supported;Feet supported Sitting balance-Leahy Scale: Good Sitting balance - Comments: steady sitting reaching within BOS   Standing balance support: No upper extremity supported Standing balance-Leahy Scale: Fair Standing balance comment: steady static standing                             Pertinent Vitals/Pain Pain Assessment: 0-10 Pain Score: 7  Pain Location: mid back pain Pain Descriptors / Indicators: Aching;Sore Pain Intervention(s): Limited activity within patient's tolerance;Monitored during session;Repositioned  Vitals (HR and O2 on 2 L via nasal cannula) stable and WFL throughout treatment session.    Home Living Family/patient expects to be discharged to:: Assisted living ([place])               Home Equipment: Gilford Rile - 4 wheels;Cane - single point;Grab bars - tub/shower;Grab bars - toilet Additional Comments: Walk in shower and tub shower (grab bar in both); regular toilet with grab bar    Prior Function Level of Independence: Needs assistance   Gait / Transfers Assistance Needed: Pt reports she does not wear back brace at home but a nurse will put brace on when she goes out.  Pt reports not using any AD for walking typically.  ADL's / Homemaking Assistance Needed: Pt reports having assist for changing her bed and light cleaning.        Hand Dominance        Extremity/Trunk Assessment   Upper Extremity Assessment Upper Extremity Assessment:  Generalized weakness    Lower Extremity Assessment Lower Extremity Assessment: Generalized weakness    Cervical / Trunk Assessment Cervical / Trunk Assessment: Other exceptions Cervical / Trunk Exceptions: forward head/shoulders  Communication   Communication: No difficulties  Cognition Arousal/Alertness: Awake/alert Behavior During Therapy: WFL for tasks assessed/performed Overall Cognitive Status: Within Functional Limits for tasks assessed                                        General Comments   MD cleared pt for participation in physical therapy.  Pt agreeable to PT session.    Exercises     Assessment/Plan    PT Assessment Patient needs continued PT services  PT Problem List Decreased strength;Decreased activity tolerance;Decreased balance;Decreased mobility;Decreased knowledge of use of DME;Decreased knowledge of precautions;Pain       PT Treatment Interventions DME instruction;Gait training;Functional mobility training;Therapeutic activities;Therapeutic exercise;Balance training;Patient/family education    PT Goals (Current goals can be found in the Care Plan section)  Acute Rehab PT Goals Patient Stated Goal: to go home PT Goal Formulation: With patient Time For Goal Achievement: 07/08/20 Potential to Achieve Goals: Good    Frequency Min 2X/week   Barriers to discharge  Co-evaluation               AM-PAC PT "6 Clicks" Mobility  Outcome Measure Help needed turning from your back to your side while in a flat bed without using bedrails?: None Help needed moving from lying on your back to sitting on the side of a flat bed without using bedrails?: A Little Help needed moving to and from a bed to a chair (including a wheelchair)?: A Little Help needed standing up from a chair using your arms (e.g., wheelchair or bedside chair)?: A Little Help needed to walk in hospital room?: A Little Help needed climbing 3-5 steps with a railing?  : A Little 6 Click Score: 19    End of Session Equipment Utilized During Treatment: Gait belt;Back brace Activity Tolerance: Other (comment) (Limited d/t pt not wanting to wear back brace anymore) Patient left: in bed;with call bell/phone within reach;with bed alarm set Nurse Communication: Mobility status;Precautions (pt fell asleep in bed prior to therapist leaving room so deferred asking for pain medication) PT Visit Diagnosis: Other abnormalities of gait and mobility (R26.89);Muscle weakness (generalized) (M62.81);History of falling (Z91.81);Pain    Time: 6237-6283 PT Time Calculation (min) (ACUTE ONLY): 48 min   Charges:   PT Evaluation $PT Eval Low Complexity: 1 Low PT Treatments $Therapeutic Activity: 23-37 mins       Leitha Bleak, PT 06/24/20, 12:32 PM

## 2020-06-24 NOTE — TOC Initial Note (Signed)
Transition of Care [facility]) - Initial/Assessment Note    Patient Details  Name: Alexandria Roman MRN: [id number] Date of Birth: Dec 04, 1929  Transition of [facility]) CM/SW Contact:    Alberteen Sam, LCSW Phone Number: 06/24/2020, 2:47 PM  Clinical Narrative:                  CSW spoke with patient who reports she plans to go home to her apt at [facility] upon discharge. Reports having home health services however does not remember through whom. Patient gave CSW permission to reach out to [facility] to inquire.   CSW spoke with Neoma Laming at [facility] who reports patient was receiving home health services at her apartment through [facility].   CSW spoke with [place] at [facility] who confirms patient was being seen by them for [facility] PT and OT. CSW informed [place] that patient is missing a piece of her brace per PT and that they need this piece for patient to participate fully with PT. [place] will reach out to her [facility] team to see if anyone can gain access to patient's apt for this piece of the brace.   Expected Discharge Plan: [place] Barriers to Discharge: Continued Medical Work up   Patient Goals and CMS Choice Patient states their goals for this hospitalization and ongoing recovery are:: to go back home CMS Medicare.gov Compare Post Acute Care list provided to:: Patient Choice offered to / list presented to : Patient  Expected Discharge Plan and Services Expected Discharge Plan: [place] Choice: [place] arrangements for the past 2 months: [place] ([place])                           [place] Arranged: PT,OT [facility] Agency: [facility] Date [place]: 06/24/20 Time [place]: 1414 Representative spoke with at [place]: [place] Arrangements/Services Living arrangements for the past 2 months: [place] ([place]) Lives with:: Self   Do  you feel safe going back to the place where you live?: Yes          Current home services: Home PT,Home OT    Activities of Daily Living   ADL Screening (condition at time of admission) Patient's cognitive ability adequate to safely complete daily activities?: No Is the patient deaf or have difficulty hearing?: Yes Does the patient have difficulty seeing, even when wearing glasses/contacts?: No Does the patient have difficulty concentrating, remembering, or making decisions?: Yes Patient able to express need for assistance with ADLs?: Yes  Permission Sought/Granted                  Emotional Assessment Appearance:: Appears stated age Attitude/Demeanor/Rapport: Gracious Affect (typically observed): Calm Orientation: : Oriented to Self,Oriented to Place,Oriented to  Time,Oriented to Situation Alcohol / Substance Use: Not Applicable Psych Involvement: No (comment)  Admission diagnosis:  Dysuria [R30.0] Cystitis [N30.90] Severe sepsis ([place]) [A41.9, R65.20] Sepsis due to gram-negative UTI ([place]) [A41.50, N39.0] Patient Active Problem List   Diagnosis Date Noted  . Severe sepsis ([place])   . Cystitis   . E. coli UTI   . Chronic thoracic back pain   . Sepsis due to gram-negative UTI ([place]) 06/22/2020  . Nocturnal hypoxia   . Weakness   . Neuropathy   . Glaucoma of both eyes   . Thoracic compression  fracture ([place]) 04/19/2020  . Hip fracture ([place]) 05/19/2018  . Depression with anxiety 12/09/2016  . Numbness of foot 12/09/2016  . Macular degeneration 12/09/2016  . Gait instability 12/08/2016  . Upper respiratory infection, viral 12/06/2016  . Primary osteoarthritis of both knees 07/20/2016  . Postmenopausal osteoporosis 04/15/2016  . Chest pain 03/19/2016  . Spinal stenosis 08/04/2015  . Myalgia 06/11/2015  . Polyarthralgia 06/11/2015  . Chronic midline low back pain with bilateral sciatica 01/10/2015  . Essential hypertension 06/04/2014  . CKD (chronic kidney disease)  stage 3, GFR 30-59 ml/min (HCC) 06/04/2014  . Mixed hyperlipidemia 06/04/2014  . Other allergic rhinitis 06/04/2014  . DDD (degenerative disc disease), lumbar 09/07/2013  . Lumbar radiculitis 09/07/2013  . Hypothyroidism, unspecified 08/02/2013   PCP:  Adin Hector, MD Pharmacy:   [facility] [address] ([address]), [place] [address] [place]) [place] [address] Phone: [phone] Fax: [place], [place] [address] [place] [address] Phone: [phone] Fax: [phone]  [facility] DR[facility] STORE [place], [place] - [place] [facility] [address] AT [address] [facility] [place] [address] Phone: [phone] Fax: [phone]     Social Determinants of Health (SDOH) Interventions    Readmission Risk Interventions No flowsheet data found.

## 2020-06-24 NOTE — Progress Notes (Addendum)
[place] [facility] [place] NAME: Alexandria Roman    MR#:  [id number]  DATE OF BIRTH:  [birth year]  SUBJECTIVE:  came in with symptoms of frequency and dysuria. Had some chills. No family in the room. Seems to be doing little bit better.   Brace was delivered to the room. She will be working with PT No fever  REVIEW OF SYSTEMS:   Review of Systems  Constitutional: Negative for chills, fever and weight loss.  HENT: Negative for ear discharge, ear pain and nosebleeds.   Eyes: Negative for blurred vision, pain and discharge.  Respiratory: Negative for sputum production, shortness of breath, wheezing and stridor.   Cardiovascular: Negative for chest pain, palpitations, orthopnea and PND.  Gastrointestinal: Negative for abdominal pain, diarrhea, nausea and vomiting.  Genitourinary: Negative for urgency.  Musculoskeletal: Positive for back pain. Negative for joint pain.  Neurological: Positive for weakness. Negative for sensory change, speech change and focal weakness.  Psychiatric/Behavioral: Negative for depression and hallucinations. The patient is not nervous/anxious.    Tolerating Diet:yes Tolerating PT:   DRUG ALLERGIES:   Allergies  Allergen Reactions  . Amoxicillin-Pot Clavulanate Diarrhea  . Cephalexin Other (See Comments) and Nausea And Vomiting  . Hydrochlorothiazide Other (See Comments) and Nausea And Vomiting  . Hydrocodone Bit-Homatrop Mbr Other (See Comments)  . Hydrocodone-Chlorpheniramine Other (See Comments)  . Metronidazole Other (See Comments)    Other reaction(s): Other (See Comments) Other Reaction: Not Assessed Other Reaction: Not Assessed  . Moxifloxacin Other (See Comments)  . Prednisone Nausea Only  . Simvastatin Other (See Comments)  . Sulfa Antibiotics Other (See Comments)    Other reaction(s): Unknown  . Sulfasalazine Other (See Comments)    Other reaction(s): Unknown  . Tramadol Other (See Comments)  .  Celecoxib Nausea And Vomiting  . Codeine Nausea Only    Other reaction(s): Other (See Comments) Other reaction(s): Unknown  . Cyclobenzaprine Other (See Comments) and Nausea Only    Other reaction(s): Other (See Comments)  . Oxycodone-Acetaminophen Nausea Only and Nausea And Vomiting  . Penicillin V Potassium Rash    Other reaction(s): Other (See Comments) Other reaction(s): Unknown    VITALS:  Blood pressure (!) 137/94, pulse 82, temperature 98 F (36.7 C), temperature source Oral, resp. rate 18, height 5\' 4"  (1.626 m), weight 68.4 kg, SpO2 92 %.  PHYSICAL EXAMINATION:   Physical Exam  GENERAL:  87 y.o.-year-old patient lying in the bed with no acute distress.  LUNGS: Normal breath sounds bilaterally, no wheezing, rales, rhonchi. No use of accessory muscles of respiration.  CARDIOVASCULAR: S1, S2 normal. No murmurs, rubs, or gallops.  ABDOMEN: Soft, nontender, nondistended. Bowel sounds present. No organomegaly or mass.  EXTREMITIES: No cyanosis, clubbing or edema b/l.    NEUROLOGIC: Cranial nerves II through XII are intact. No focal Motor or sensory deficits b/l.   PSYCHIATRIC:  patient is alert and oriented x 3.  SKIN: No obvious rash, lesion, or ulcer.   LABORATORY PANEL:  CBC Recent Labs  Lab 06/23/20 0359  WBC 18.2*  HGB 13.0  HCT 40.0  PLT 216    Chemistries  Recent Labs  Lab 06/22/20 1546 06/23/20 0359  NA 133* 136  K 3.6 3.8  CL 98 105  CO2 23 23  GLUCOSE 123* 124*  BUN 15 16  CREATININE 0.95 0.91  CALCIUM 9.2 8.0*  AST 23  --   ALT 14  --   ALKPHOS 72  --   BILITOT  1.0  --    Cardiac Enzymes No results for input(s): TROPONINI in the last 168 hours. RADIOLOGY:  No results found. ASSESSMENT AND PLAN:  Alexandria Roman is a 87 y.o. Caucasian female with medical history significant for essential hypertension and cancer, who presented to the emergency room with acute onset urinary frequency and urgency with dysuria that started last night with  associated generalized weakness.  Her urinary incontinence has been worse.  She denied nausea or vomiting or abdominal pain.  No fever but she felt shaking chills  Sepsis due to UTI--POA Found to have tachycardia and tachypnea with leukocytosis.  She meets severe sepsis criteria given lactic acid level of 3.9 initially. -- BC ID positive for E. Coli-- continue IV Rocephin  -- trending lactic acid down. Came in with 3.9 --- down to 2.7 --LA pending -- white count 18.2. Pro calcitonin 23.05--cbc in am --sepsis improving   Low back pain with history of lumbar radiculitis. T-1 T8 compression fracture - Pain management will be provided as well as muscle relaxants. -  PT is unable to work with her. Patient requires three LSO brace where all the time when out of bed. It seems patient has not compliant to wearing the brace at her apartment. She apparently does not have one and does not want [place] to order a new one since she does not want to get charged for it. -- Discussed with neurology Dr. Lacinda Axon recommends patient would require to wear the brace while out of bed. -- Patient denies any recent falls. She usually walks with walker at home   Essential hypertension. - continue Norvasc.  Hypothyroidism. - continue Synthroid.   Peripheral neuropathy. -  continue Neurontin.   Anxiety and depression. - continue Cymbalta and Xanax..   DVT prophylaxis: Lovenox. Code Status:  patient is DNR/DNI per pt Family communication :none today   Level of care: Med-Surg Status is: Inpatient  Remains inpatient appropriate because:Inpatient level of care appropriate due to severity of illness   Dispo: The patient is from: Home              Anticipated d/c is to: Homeindependent apt              Patient currently is not medically stable to d/c.   Difficult to place patient No  PT to see pt today using her Brace. TOC for d/c planning. If remains stable d/c tomorrow  TOTAL TIME TAKING CARE OF THIS  PATIENT: 25 minutes.  >50% time spent on counselling and coordination of care  Note: This dictation was prepared with Dragon dictation along with smaller phrase technology. Any transcriptional errors that result from this process are unintentional.  Fritzi Mandes M.D    [facility]   CC: Primary care physician; Adin Hector, MDPatient ID: Mare Loan, female   DOB: [birth year], 87 y.o.   MRN: [id number]

## 2020-06-25 ENCOUNTER — Encounter: Payer: Self-pay | Admitting: Family Medicine

## 2020-06-25 LAB — CULTURE, BLOOD (ROUTINE X 2)

## 2020-06-25 LAB — URINE CULTURE: Culture: >=100000 — AB

## 2020-06-25 LAB — CBC
HCT: 38 % (ref 36.0–46.0)
Hemoglobin: 12.8 g/dL (ref 12.0–15.0)
MCH: 29.2 pg (ref 26.0–34.0)
MCHC: 33.7 g/dL (ref 30.0–36.0)
MCV: 86.8 fL (ref 80.0–100.0)
Platelets: 264 10*3/uL (ref 150–400)
RBC: 4.38 MIL/uL (ref 3.87–5.11)
RDW: 14.6 % (ref 11.5–15.5)
WBC: 11.4 10*3/uL — ABNORMAL HIGH (ref 4.0–10.5)
nRBC: 0 % (ref 0.0–0.2)

## 2020-06-25 MED ORDER — CEFAZOLIN SODIUM-DEXTROSE 2-4 GM/100ML-% IV SOLN
2.0000 g | Freq: Three times a day (TID) | INTRAVENOUS | Status: DC
  Administered 2020-06-26: 2 g via INTRAVENOUS
  Filled 2020-06-25 (×6): qty 100

## 2020-06-25 NOTE — Plan of Care (Signed)
  Problem: Clinical Measurements: Goal: Ability to maintain clinical measurements within normal limits will improve Outcome: Progressing Goal: Will remain free from infection Outcome: Progressing Goal: Diagnostic test results will improve Outcome: Progressing Goal: Respiratory complications will improve Outcome: Progressing Goal: Cardiovascular complication will be avoided Outcome: Progressing   Problem: Pain Managment: Goal: General experience of comfort will improve Outcome: Progressing   Pt is involved in and agrees with the plan for care. V/S stable. Denies any pain. Has nonproductive cough. Scheduled Mucinex given for cough. Zofran given for complaints of nausea.

## 2020-06-25 NOTE — Care Management Important Message (Signed)
Important Message  Patient Details  Name: Alexandria Roman MRN: [id number] Date of Birth: 1929/11/25   Medicare Important Message Given:  Yes     Dannette Barbara 06/25/2020, 10:35 AM

## 2020-06-25 NOTE — Progress Notes (Signed)
Progress Note    Alexandria Roman  [id number] DOB: [birth year]  DOA: 06/22/2020 PCP: Adin Hector, MD      Brief Narrative:    Medical records reviewed and are as summarized below:  Alexandria Roman is a 87 y.o. female with medical history significant for hypertension, hypothyroidism, peripheral neuropathy, anxiety, depression, cancer, recent fall, T1 and a T8 compression fracture.  She presented to the hospital because of increased frequency of micturition, urinary urgency, dysuria and generalized weakness.  She was found to have sepsis secondary to UTI.  She was treated with IV fluids and empiric IV antibiotics.  Urine and blood cultures showed E. coli.      Assessment/Plan:   Active Problems:   Sepsis due to gram-negative UTI (HCC)   Severe sepsis (HCC)   Cystitis   E. coli UTI   Chronic thoracic back pain   Body mass index is 25.88 kg/m.    E. coli sepsis, bacteremia and UTI: Change IV ceftriaxone to IV cefazolin.  Repeat blood culture is pending.  Low back pain, history of lumbar radiculitis, T1, T8 compression fracture: Analgesics as needed for pain.  LSO brace to be worn when out of bed.  Patient does not want to wear her LSO brace.  She has been educated on the importance of wearing the brace.  Other comorbidities include hypertension, hypothyroidism, peripheral neuropathy, anxiety and depression.  I spoke to [place], home health care nurse who had requested a call from me.  She was concerned that patient has some confusion and lethargy yesterday and she attributed this to trazodone that had been prescribed for sleep.  She was informed that patient was doing better today and was very communicative.  She told me that patient has an appointment with neurosurgeon on 06/26/2020.  Diet Order            Diet Heart Room service appropriate? Yes; Fluid consistency: Thin  Diet effective now                     Consultants:  None  Procedures:  None    Medications:   . amLODipine  2.5 mg Oral Daily  . calcitonin (salmon)  1 spray Alternating Nares Daily  . dextromethorphan-guaiFENesin  1 tablet Oral BID  . DULoxetine  30 mg Oral BID  . enoxaparin (LOVENOX) injection  40 mg Subcutaneous Q24H  . latanoprost  1 drop Both Eyes QHS  . levothyroxine  50 mcg Oral QAC breakfast  . metaxalone  400 mg Oral TID  . multivitamin-lutein  1 capsule Oral BID  . polyethylene glycol  17 g Oral Daily  . timolol  1 drop Both Eyes Daily   Continuous Infusions: . [START ON 06/26/2020]  ceFAZolin (ANCEF) IV       Anti-infectives (From admission, onward)   Start     Dose/Rate Route Frequency Ordered Stop   06/26/20 0600  ceFAZolin (ANCEF) IVPB 2g/100 mL premix        2 g 200 mL/hr over 30 Minutes Intravenous Every 8 hours 06/25/20 1109     06/23/20 1800  cefTRIAXone (ROCEPHIN) 1 g in sodium chloride 0.9 % 100 mL IVPB  Status:  Discontinued        1 g 200 mL/hr over 30 Minutes Intravenous Every 24 hours 06/22/20 1940 06/23/20 0652   06/23/20 0800  cefTRIAXone (ROCEPHIN) 2 g in sodium chloride 0.9 % 100 mL IVPB  Status:  Discontinued  2 g 200 mL/hr over 30 Minutes Intravenous Every 24 hours 06/23/20 0654 06/25/20 1109   06/22/20 1715  cefTRIAXone (ROCEPHIN) 1 g in sodium chloride 0.9 % 100 mL IVPB        1 g 200 mL/hr over 30 Minutes Intravenous  Once 06/22/20 1707 06/22/20 1830             Family Communication/Anticipated D/C date and plan/Code Status   DVT prophylaxis: enoxaparin (LOVENOX) injection 40 mg Start: 06/22/20 2200     Code Status: DNR  Family Communication: None Disposition Plan:    Status is: Inpatient  Remains inpatient appropriate because:IV treatments appropriate due to intensity of illness or inability to take PO and Inpatient level of care appropriate due to severity of illness   Dispo: The patient is from: Home              Anticipated d/c is  to: Home              Patient currently is not medically stable to d/c.   Difficult to place patient No           Subjective:   C/o nausea and general weakness.  She also complained LSO brace is too tight and she does not want to wear it.  She says she has had this for about 2 weeks and this is the second brace she's had.   Objective:    Vitals:   06/24/20 2256 06/25/20 0503 06/25/20 0852 06/25/20 1115  BP: 123/88 (!) 139/103 (!) 138/96 (!) 128/93  Pulse: 95 75 (!) 110 93  Resp: 16 20 18 16   Temp: 97.6 F (36.4 C) (!) 97.5 F (36.4 C) (!) 97.5 F (36.4 C) 98.8 F (37.1 C)  TempSrc:  Oral Oral   SpO2: 93% 93% 93% 92%  Weight:      Height:       No data found.   Intake/Output Summary (Last 24 hours) at 06/25/2020 1214 Last data filed at 06/24/2020 1700 Gross per 24 hour  Intake 120 ml  Output --  Net 120 ml   Filed Weights   06/22/20 1303 06/22/20 2228  Weight: 63.5 kg 68.4 kg    Exam:  GEN: NAD SKIN: No rash EYES: EOMI ENT: MMM CV: RRR PULM: CTA B ABD: soft, ND, NT, +BS CNS: AAO x 3, non focal EXT: No edema or tenderness        Data Reviewed:   I have personally reviewed following labs and imaging studies:  Labs: Labs show the following:   Basic Metabolic Panel: Recent Labs  Lab 06/22/20 1546 06/23/20 0359  NA 133* 136  K 3.6 3.8  CL 98 105  CO2 23 23  GLUCOSE 123* 124*  BUN 15 16  CREATININE 0.95 0.91  CALCIUM 9.2 8.0*   GFR Estimated Creatinine Clearance: 40.6 mL/min (by C-G formula based on SCr of 0.91 mg/dL). Liver Function Tests: Recent Labs  Lab 06/22/20 1546  AST 23  ALT 14  ALKPHOS 72  BILITOT 1.0  PROT 7.2  ALBUMIN 3.8   Recent Labs  Lab 06/22/20 1546  LIPASE 33   No results for input(s): AMMONIA in the last 168 hours. Coagulation profile Recent Labs  Lab 06/23/20 0359  INR 1.2    CBC: Recent Labs  Lab 06/22/20 1546 06/23/20 0359  WBC 18.0* 18.2*  NEUTROABS 15.1*  --   HGB 15.2* 13.0  HCT  45.1 40.0  MCV 87.1 90.3  PLT 218 216  Cardiac Enzymes: No results for input(s): CKTOTAL, CKMB, CKMBINDEX, TROPONINI in the last 168 hours. BNP (last 3 results) No results for input(s): PROBNP in the last 8760 hours. CBG: No results for input(s): GLUCAP in the last 168 hours. D-Dimer: No results for input(s): DDIMER in the last 72 hours. Hgb A1c: No results for input(s): HGBA1C in the last 72 hours. Lipid Profile: No results for input(s): CHOL, HDL, LDLCALC, TRIG, CHOLHDL, LDLDIRECT in the last 72 hours. Thyroid function studies: No results for input(s): TSH, T4TOTAL, T3FREE, THYROIDAB in the last 72 hours.  Invalid input(s): FREET3 Anemia work up: No results for input(s): VITAMINB12, FOLATE, FERRITIN, TIBC, IRON, RETICCTPCT in the last 72 hours. Sepsis Labs: Recent Labs  Lab 06/22/20 1413 06/22/20 1546 06/23/20 0359 06/24/20 1005  PROCALCITON  --   --  23.05  --   WBC  --  18.0* 18.2*  --   LATICACIDVEN 3.9* 2.7*  --  1.4    Microbiology Recent Results (from the past 240 hour(s))  Urine Culture     Status: Abnormal   Collection Time: 06/22/20  2:13 PM   Specimen: Urine, Random  Result Value Ref Range Status   Specimen Description   Final    URINE, RANDOM Performed at [facility], [address] [place], [place] [address]    Special Requests   Final    NONE Performed at [facility], [address] [place], [place] [address]    Culture >=100,000 COLONIES/mL ESCHERICHIA COLI (A)  Final   Report Status 06/25/2020 FINAL  Final   Organism ID, Bacteria ESCHERICHIA COLI (A)  Final      Susceptibility   Escherichia coli - MIC*    AMPICILLIN >=32 RESISTANT Resistant     CEFAZOLIN <=4 SENSITIVE Sensitive     CEFEPIME <=0.12 SENSITIVE Sensitive     CEFTRIAXONE <=0.25 SENSITIVE Sensitive     CIPROFLOXACIN <=0.25 SENSITIVE Sensitive     GENTAMICIN <=1 SENSITIVE Sensitive     IMIPENEM <=0.25 SENSITIVE Sensitive     NITROFURANTOIN <=16 SENSITIVE  Sensitive     TRIMETH/SULFA <=20 SENSITIVE Sensitive     AMPICILLIN/SULBACTAM >=32 RESISTANT Resistant     PIP/TAZO <=4 SENSITIVE Sensitive     * >=100,000 COLONIES/mL ESCHERICHIA COLI  Culture, blood (routine x 2)     Status: Abnormal   Collection Time: 06/22/20  6:07 PM   Specimen: BLOOD  Result Value Ref Range Status   Specimen Description   Final    BLOOD BLOOD LEFT WRIST Performed at [facility], [address] [place], [place] [address]    Special Requests   Final    BOTTLES DRAWN AEROBIC ONLY Blood Culture results may not be optimal due to an inadequate volume of blood received in culture bottles Performed at [facility], [address] [place], [place] [address]    Culture  Setup Time   Final    Organism ID to follow [place] TO, READ BACK BY AND VERIFIED WITHLloyd Huger AT [id number] 06/23/20 SDR Performed at [facility], [address] [place], [place] [address]    Culture ESCHERICHIA COLI (A)  Final   Report Status 06/25/2020 FINAL  Final   Organism ID, Bacteria ESCHERICHIA COLI  Final      Susceptibility   Escherichia coli - MIC*    AMPICILLIN >=32 RESISTANT Resistant     CEFAZOLIN <=4 SENSITIVE Sensitive     CEFEPIME <=0.12 SENSITIVE Sensitive  CEFTAZIDIME <=1 SENSITIVE Sensitive     CEFTRIAXONE <=0.25 SENSITIVE Sensitive     CIPROFLOXACIN <=0.25 SENSITIVE Sensitive     GENTAMICIN <=1 SENSITIVE Sensitive     IMIPENEM <=0.25 SENSITIVE Sensitive     TRIMETH/SULFA <=20 SENSITIVE Sensitive     AMPICILLIN/SULBACTAM 16 INTERMEDIATE Intermediate     PIP/TAZO <=4 SENSITIVE Sensitive     * ESCHERICHIA COLI  Blood Culture ID Panel (Reflexed)     Status: Abnormal   Collection Time: 06/22/20  6:07 PM  Result Value Ref Range Status   Enterococcus faecalis NOT DETECTED NOT DETECTED Final   Enterococcus Faecium NOT DETECTED NOT DETECTED Final   Listeria monocytogenes NOT DETECTED NOT  DETECTED Final   Staphylococcus species NOT DETECTED NOT DETECTED Final   Staphylococcus aureus (BCID) NOT DETECTED NOT DETECTED Final   Staphylococcus epidermidis NOT DETECTED NOT DETECTED Final   Staphylococcus lugdunensis NOT DETECTED NOT DETECTED Final   Streptococcus species NOT DETECTED NOT DETECTED Final   Streptococcus agalactiae NOT DETECTED NOT DETECTED Final   Streptococcus pneumoniae NOT DETECTED NOT DETECTED Final   Streptococcus pyogenes NOT DETECTED NOT DETECTED Final   A.calcoaceticus-baumannii NOT DETECTED NOT DETECTED Final   Bacteroides fragilis NOT DETECTED NOT DETECTED Final   Enterobacterales DETECTED (A) NOT DETECTED Final    Comment: Enterobacterales represent a large order of gram negative bacteria, not a single organism. CRITICAL RESULT CALLED TO, READ BACK BY AND VERIFIED WITH:  NATHAN BELUE AT [id number] 06/23/20 SDR    Enterobacter cloacae complex NOT DETECTED NOT DETECTED Final   Escherichia coli DETECTED (A) NOT DETECTED Final    Comment: CRITICAL RESULT CALLED TO, READ BACK BY AND VERIFIED WITH:  NATHAN BELUE AT 0618 06/23/20 SDR    Klebsiella aerogenes NOT DETECTED NOT DETECTED Final   Klebsiella oxytoca NOT DETECTED NOT DETECTED Final   Klebsiella pneumoniae NOT DETECTED NOT DETECTED Final   Proteus species NOT DETECTED NOT DETECTED Final   Salmonella species NOT DETECTED NOT DETECTED Final   Serratia marcescens NOT DETECTED NOT DETECTED Final   Haemophilus influenzae NOT DETECTED NOT DETECTED Final   Neisseria meningitidis NOT DETECTED NOT DETECTED Final   Pseudomonas aeruginosa NOT DETECTED NOT DETECTED Final   Stenotrophomonas maltophilia NOT DETECTED NOT DETECTED Final   Candida albicans NOT DETECTED NOT DETECTED Final   Candida auris NOT DETECTED NOT DETECTED Final   Candida glabrata NOT DETECTED NOT DETECTED Final   Candida krusei NOT DETECTED NOT DETECTED Final   Candida parapsilosis NOT DETECTED NOT DETECTED Final   Candida tropicalis NOT DETECTED  NOT DETECTED Final   Cryptococcus neoformans/gattii NOT DETECTED NOT DETECTED Final   CTX-M ESBL NOT DETECTED NOT DETECTED Final   Carbapenem resistance IMP NOT DETECTED NOT DETECTED Final   Carbapenem resistance KPC NOT DETECTED NOT DETECTED Final   Carbapenem resistance NDM NOT DETECTED NOT DETECTED Final   Carbapenem resist OXA 48 LIKE NOT DETECTED NOT DETECTED Final   Carbapenem resistance VIM NOT DETECTED NOT DETECTED Final    Comment: Performed at [facility], [address] [place], [place] [address]  Culture, blood (routine x 2)     Status: Abnormal   Collection Time: 06/22/20  6:08 PM   Specimen: BLOOD  Result Value Ref Range Status   Specimen Description   Final    BLOOD BLOOD LEFT HAND Performed at [facility], [address], [place],  [address]    Special Requests   Final    BOTTLES DRAWN AEROBIC ONLY Blood Culture results  may not be optimal due to an inadequate volume of blood received in culture bottles Performed at [facility] [address] [place], [place] [address]    Culture  Setup Time   Final    GRAM NEGATIVE RODS AEROBIC BOTTLE ONLY CRITICAL VALUE NOTED.  VALUE IS CONSISTENT WITH PREVIOUSLY REPORTED AND CALLED VALUE. Performed at [facility], [address] [place], [place] [address]    Culture (A)  Final    ESCHERICHIA COLI SUSCEPTIBILITIES PERFORMED ON PREVIOUS CULTURE WITHIN THE LAST 5 DAYS. Performed at [facility], [place] [address], [place], [place] [address]    Report Status 06/25/2020 FINAL  Final  Resp Panel by RT-PCR (Flu A&B, Covid) Nasopharyngeal Swab     Status: None   Collection Time: 06/22/20  8:56 PM   Specimen: Nasopharyngeal Swab; Nasopharyngeal(NP) swabs in vial transport medium  Result Value Ref Range Status   SARS Coronavirus 2 by RT PCR NEGATIVE NEGATIVE Final    Comment: (NOTE) SARS-CoV-2 target nucleic acids are NOT DETECTED.  The SARS-CoV-2 RNA is generally detectable in  upper respiratory specimens during the acute phase of infection. The lowest concentration of SARS-CoV-2 viral copies this assay can detect is 138 copies/mL. A negative result does not preclude SARS-Cov-2 infection and should not be used as the sole basis for treatment or other patient management decisions. A negative result may occur with  improper specimen collection/handling, submission of specimen other than nasopharyngeal swab, presence of viral mutation(s) within the areas targeted by this assay, and inadequate number of viral copies(<138 copies/mL). A negative result must be combined with clinical observations, patient history, and epidemiological information. The expected result is Negative.  Fact Sheet for Patients:  EntrepreneurPulse.com.au  Fact Sheet for Healthcare Providers:  IncredibleEmployment.be  This test is no t yet approved or cleared by the [place] FDA and  has been authorized for detection and/or diagnosis of SARS-CoV-2 by FDA under an Emergency Use Authorization (EUA). This EUA will remain  in effect (meaning this test can be used) for the duration of the COVID-19 declaration under Section 564(b)(1) of the Act, 21 U.S.C.section 360bbb-3(b)(1), unless the authorization is terminated  or revoked sooner.       Influenza A by PCR NEGATIVE NEGATIVE Final   Influenza B by PCR NEGATIVE NEGATIVE Final    Comment: (NOTE) The Xpert Xpress SARS-CoV-2/FLU/RSV plus assay is intended as an aid in the diagnosis of influenza from Nasopharyngeal swab specimens and should not be used as a sole basis for treatment. Nasal washings and aspirates are unacceptable for Xpert Xpress SARS-CoV-2/FLU/RSV testing.  Fact Sheet for Patients: EntrepreneurPulse.com.au  Fact Sheet for Healthcare Providers: IncredibleEmployment.be  This test is not yet approved or cleared by the [place] FDA and has been  authorized for detection and/or diagnosis of SARS-CoV-2 by FDA under an Emergency Use Authorization (EUA). This EUA will remain in effect (meaning this test can be used) for the duration of the COVID-19 declaration under Section 564(b)(1) of the Act, 21 U.S.C. section 360bbb-3(b)(1), unless the authorization is terminated or revoked.  Performed at [facility], [address] [place], [place] [address]     Procedures and diagnostic studies:  No results found.             LOS: 3 days   Tywan Siever  [facility]   Pager on [email or web address]. If 7PM-7AM, please contact night-coverage at [email or web address]     06/25/2020, 12:14 PM

## 2020-06-26 MED ORDER — CEFDINIR 300 MG PO CAPS: 300.0000 mg | ORAL_CAPSULE | Freq: Two times a day (BID) | ORAL | 0 refills | Status: AC

## 2020-06-26 MED ORDER — CEFDINIR 300 MG PO CAPS: 300.0000 mg | ORAL_CAPSULE | Freq: Two times a day (BID) | ORAL | 0 refills | Status: DC

## 2020-06-26 NOTE — Progress Notes (Signed)
Patient for discharge complained that her shoes and clothing were missing.  A bag of clothes were found but no shoes

## 2020-06-26 NOTE — Plan of Care (Signed)
  Problem: Education: Goal: Knowledge of General Education information will improve Description: Including pain rating scale, medication(s)/side effects and non-pharmacologic comfort measures 06/26/2020 1339 by Vivien Rota, RN Outcome: Adequate for Discharge 06/26/2020 0859 by Vivien Rota, RN Outcome: Progressing   Problem: Health Behavior/Discharge Planning: Goal: Ability to manage health-related needs will improve 06/26/2020 1339 by Vivien Rota, RN Outcome: Adequate for Discharge 06/26/2020 0859 by Vivien Rota, RN Outcome: Progressing   Problem: Clinical Measurements: Goal: Ability to maintain clinical measurements within normal limits will improve 06/26/2020 1339 by Vivien Rota, RN Outcome: Adequate for Discharge 06/26/2020 0859 by Vivien Rota, RN Outcome: Progressing Goal: Will remain free from infection 06/26/2020 1339 by Vivien Rota, RN Outcome: Adequate for Discharge 06/26/2020 0859 by Vivien Rota, RN Outcome: Progressing Goal: Diagnostic test results will improve 06/26/2020 1339 by Vivien Rota, RN Outcome: Adequate for Discharge 06/26/2020 0859 by Vivien Rota, RN Outcome: Progressing Goal: Respiratory complications will improve 06/26/2020 1339 by Vivien Rota, RN Outcome: Adequate for Discharge 06/26/2020 0859 by Vivien Rota, RN Outcome: Progressing Goal: Cardiovascular complication will be avoided 06/26/2020 1339 by Vivien Rota, RN Outcome: Adequate for Discharge 06/26/2020 0859 by Vivien Rota, RN Outcome: Progressing   Problem: Activity: Goal: Risk for activity intolerance will decrease 06/26/2020 1339 by Vivien Rota, RN Outcome: Adequate for Discharge 06/26/2020 0859 by Vivien Rota, RN Outcome: Progressing   Problem: Nutrition: Goal: Adequate nutrition will be maintained 06/26/2020 1339 by Vivien Rota, RN Outcome:  Adequate for Discharge 06/26/2020 0859 by Vivien Rota, RN Outcome: Progressing   Problem: Coping: Goal: Level of anxiety will decrease 06/26/2020 1339 by Vivien Rota, RN Outcome: Adequate for Discharge 06/26/2020 0859 by Vivien Rota, RN Outcome: Progressing   Problem: Elimination: Goal: Will not experience complications related to bowel motility 06/26/2020 1339 by Vivien Rota, RN Outcome: Adequate for Discharge 06/26/2020 0859 by Vivien Rota, RN Outcome: Progressing Goal: Will not experience complications related to urinary retention 06/26/2020 1339 by Vivien Rota, RN Outcome: Adequate for Discharge 06/26/2020 0859 by Vivien Rota, RN Outcome: Progressing   Problem: Pain Managment: Goal: General experience of comfort will improve 06/26/2020 1339 by Vivien Rota, RN Outcome: Adequate for Discharge 06/26/2020 0859 by Vivien Rota, RN Outcome: Progressing   Problem: Safety: Goal: Ability to remain free from injury will improve 06/26/2020 1339 by Vivien Rota, RN Outcome: Adequate for Discharge 06/26/2020 0859 by Vivien Rota, RN Outcome: Progressing   Problem: Skin Integrity: Goal: Risk for impaired skin integrity will decrease 06/26/2020 1339 by Vivien Rota, RN Outcome: Adequate for Discharge 06/26/2020 0859 by Vivien Rota, RN Outcome: Progressing

## 2020-06-26 NOTE — Plan of Care (Signed)

## 2020-06-26 NOTE — TOC Transition Note (Addendum)
Transition of Care [facility]) - CM/SW Discharge Note   Patient Details  Name: Alexandria Roman MRN: [id number] Date of Birth: 03/03/1929  [facility] of [facility]) CM/SW Contact:  Candie Chroman, LCSW Phone Number: 06/26/2020, 10:18 AM   Clinical Narrative:  Patient has orders to discharge home today. [facility] is aware. Deneise Lever with her personal care service agency is aware. They may plan to stay with her 24 hours a day, at least for the first few days. Patient is agreeable. Lattie Haw from patient's personal care service agency will pick patient up and take her home sometime between 2:00 and 4:00. RN is aware. Notified Germaine Pomfret, Mudlogger for her ILF, of plan for discharge today. Patient declined DME recommendation for a rolling walker. She said she has a rollator at home she prefers to use. Patient gave CSW permission to notify her son of plan for discharge today. Left him a voicemail. No further concerns. CSW signing off.    11:24 am: Received call back from daughter-in-law, Lattie Haw. Provided updated.  Final next level of care: [place] Barriers to Discharge: Barriers Resolved   Patient Goals and CMS Choice Patient states their goals for this hospitalization and ongoing recovery are:: to go back home CMS Medicare.gov Compare Post Acute Care list provided to:: Patient Choice offered to / list presented to : NA  Discharge Placement                Patient to be transferred to facility by: [place] will take her home Name of family member notified: Left voicemail for son Gwyndolyn Saxon Patient and family notified of of transfer: 06/26/20  Discharge Plan and Services     Post Acute Care Choice: [place]: PT,OT [facility] Agency: [facility] [place] Date [facility] Agency Contacted: 06/26/20 Time [place]: 1414 Representative spoke with at [place]: [place] ([place]) Interventions      Readmission Risk Interventions No flowsheet data found.

## 2020-06-26 NOTE — Discharge Summary (Addendum)
Physician Discharge Summary  Alexandria Roman [id number] DOB: 11/17/1929 DOA: 06/22/2020  PCP: Adin Hector, MD  Admit date: 06/22/2020 Discharge date: 06/26/2020  Discharge disposition: Home with home health therapy   Recommendations for Outpatient Follow-Up:   Follow-up with neurosurgeon, Dr. Lacinda Axon, as scheduled. Follow-up with PCP in 1 week  Discharge Diagnosis:   Active Problems:   Sepsis due to gram-negative UTI (HCC)   Severe sepsis (HCC)   Cystitis   E. coli UTI   Chronic thoracic back pain    Discharge Condition: Stable.  Diet recommendation:  Diet Order            Diet - low sodium heart healthy           Diet Heart Room service appropriate? Yes; Fluid consistency: Thin  Diet effective now                   Code Status: DNR     Hospital Course:   Alexandria Roman is a 87 y.o. female with medical history significant for hypertension, hypothyroidism, peripheral neuropathy, anxiety, depression, cancer, recent fall, T1 and a T8 compression fracture.  She presented to the hospital because of increased frequency of micturition, urinary urgency, dysuria and generalized weakness.  She was found to have severe sepsis secondary to UTI.  She was treated with IV fluids and empiric IV antibiotics.  Urine and blood cultures showed E. coli.  Her condition has improved and she is deemed stable for discharge to home.   ADDENDUM on 06/27/2020 at 11:55 AM  I spoke to pharmacist at [facility] who had some concerns that patient was allergic to [facility].  It was explained that patient had tolerated ceftriaxone and cefazolin in the hospital without any adverse reactions.  Unfortunately, based on allergy list on record, there were not a lot of options.  Patient will continue with [facility] as prescribed.   Discharge Exam:    Vitals:   06/25/20 2348 06/26/20 0453 06/26/20 0500 06/26/20 0808  BP: (!) 135/100 (!) 135/105 (!) 136/99 (!) 143/94  Pulse:  (!) 101 (!) 108 96 (!) 105  Resp: 18 16  20   Temp: 98.4 F (36.9 C)   (!) 97.4 F (36.3 C)  TempSrc:    Oral  SpO2: 92% 91%  92%  Weight:      Height:         GEN: NAD SKIN: Warm and dry EYES: No pallor or icterus ENT: MMM CV: RRR PULM: CTA B ABD: soft, ND, NT, +BS CNS: AAO x 3, non focal EXT: No edema or tenderness   The results of significant diagnostics from this hospitalization (including imaging, microbiology, ancillary and laboratory) are listed below for reference.     Procedures and Diagnostic Studies:   No results found.   Labs:   Basic Metabolic Panel: Recent Labs  Lab 06/22/20 1546 06/23/20 0359  NA 133* 136  K 3.6 3.8  CL 98 105  CO2 23 23  GLUCOSE 123* 124*  BUN 15 16  CREATININE 0.95 0.91  CALCIUM 9.2 8.0*   GFR Estimated Creatinine Clearance: 40.6 mL/min (by C-G formula based on SCr of 0.91 mg/dL). Liver Function Tests: Recent Labs  Lab 06/22/20 1546  AST 23  ALT 14  ALKPHOS 72  BILITOT 1.0  PROT 7.2  ALBUMIN 3.8   Recent Labs  Lab 06/22/20 1546  LIPASE 33   No results for input(s): AMMONIA in the last 168 hours. Coagulation profile  Recent Labs  Lab 06/23/20 0359  INR 1.2    CBC: Recent Labs  Lab 06/22/20 1546 06/23/20 0359 06/25/20 1403  WBC 18.0* 18.2* 11.4*  NEUTROABS 15.1*  --   --   HGB 15.2* 13.0 12.8  HCT 45.1 40.0 38.0  MCV 87.1 90.3 86.8  PLT 218 216 264   Cardiac Enzymes: No results for input(s): CKTOTAL, CKMB, CKMBINDEX, TROPONINI in the last 168 hours. BNP: Invalid input(s): POCBNP CBG: No results for input(s): GLUCAP in the last 168 hours. D-Dimer No results for input(s): DDIMER in the last 72 hours. Hgb A1c No results for input(s): HGBA1C in the last 72 hours. Lipid Profile No results for input(s): CHOL, HDL, LDLCALC, TRIG, CHOLHDL, LDLDIRECT in the last 72 hours. Thyroid function studies No results for input(s): TSH, T4TOTAL, T3FREE, THYROIDAB in the last 72 hours.  Invalid input(s):  FREET3 Anemia work up No results for input(s): VITAMINB12, FOLATE, FERRITIN, TIBC, IRON, RETICCTPCT in the last 72 hours. Microbiology Recent Results (from the past 240 hour(s))  Urine Culture     Status: Abnormal   Collection Time: 06/22/20  2:13 PM   Specimen: Urine, Random  Result Value Ref Range Status   Specimen Description   Final    URINE, RANDOM Performed at [facility], [address], [place], [place] [address]    Special Requests   Final    NONE Performed at [facility], [address] [place], [place] [address]    Culture >=100,000 COLONIES/mL ESCHERICHIA COLI (A)  Final   Report Status 06/25/2020 FINAL  Final   Organism ID, Bacteria ESCHERICHIA COLI (A)  Final      Susceptibility   Escherichia coli - MIC*    AMPICILLIN >=32 RESISTANT Resistant     CEFAZOLIN <=4 SENSITIVE Sensitive     CEFEPIME <=0.12 SENSITIVE Sensitive     CEFTRIAXONE <=0.25 SENSITIVE Sensitive     CIPROFLOXACIN <=0.25 SENSITIVE Sensitive     GENTAMICIN <=1 SENSITIVE Sensitive     IMIPENEM <=0.25 SENSITIVE Sensitive     NITROFURANTOIN <=16 SENSITIVE Sensitive     TRIMETH/SULFA <=20 SENSITIVE Sensitive     AMPICILLIN/SULBACTAM >=32 RESISTANT Resistant     PIP/TAZO <=4 SENSITIVE Sensitive     * >=100,000 COLONIES/mL ESCHERICHIA COLI  Culture, blood (routine x 2)     Status: Abnormal   Collection Time: 06/22/20  6:07 PM   Specimen: BLOOD  Result Value Ref Range Status   Specimen Description   Final    BLOOD BLOOD LEFT WRIST Performed at [facility], [address] [place], [place] [address]    Special Requests   Final    BOTTLES DRAWN AEROBIC ONLY Blood Culture results may not be optimal due to an inadequate volume of blood received in culture bottles Performed at [facility], [address] [place], [place] [address]    Culture  Setup Time   Final    Organism ID to follow [place] TO, READ  BACK BY AND VERIFIED WITHLloyd Huger AT [id number] 06/23/20 SDR Performed at [facility], [address] [place], [place] [address]    Culture ESCHERICHIA COLI (A)  Final   Report Status 06/25/2020 FINAL  Final   Organism ID, Bacteria ESCHERICHIA COLI  Final      Susceptibility   Escherichia coli - MIC*    AMPICILLIN >=32 RESISTANT Resistant     CEFAZOLIN <=4 SENSITIVE Sensitive     CEFEPIME <=  0.12 SENSITIVE Sensitive     CEFTAZIDIME <=1 SENSITIVE Sensitive     CEFTRIAXONE <=0.25 SENSITIVE Sensitive     CIPROFLOXACIN <=0.25 SENSITIVE Sensitive     GENTAMICIN <=1 SENSITIVE Sensitive     IMIPENEM <=0.25 SENSITIVE Sensitive     TRIMETH/SULFA <=20 SENSITIVE Sensitive     AMPICILLIN/SULBACTAM 16 INTERMEDIATE Intermediate     PIP/TAZO <=4 SENSITIVE Sensitive     * ESCHERICHIA COLI  Blood Culture ID Panel (Reflexed)     Status: Abnormal   Collection Time: 06/22/20  6:07 PM  Result Value Ref Range Status   Enterococcus faecalis NOT DETECTED NOT DETECTED Final   Enterococcus Faecium NOT DETECTED NOT DETECTED Final   Listeria monocytogenes NOT DETECTED NOT DETECTED Final   Staphylococcus species NOT DETECTED NOT DETECTED Final   Staphylococcus aureus (BCID) NOT DETECTED NOT DETECTED Final   Staphylococcus epidermidis NOT DETECTED NOT DETECTED Final   Staphylococcus lugdunensis NOT DETECTED NOT DETECTED Final   Streptococcus species NOT DETECTED NOT DETECTED Final   Streptococcus agalactiae NOT DETECTED NOT DETECTED Final   Streptococcus pneumoniae NOT DETECTED NOT DETECTED Final   Streptococcus pyogenes NOT DETECTED NOT DETECTED Final   A.calcoaceticus-baumannii NOT DETECTED NOT DETECTED Final   Bacteroides fragilis NOT DETECTED NOT DETECTED Final   Enterobacterales DETECTED (A) NOT DETECTED Final    Comment: Enterobacterales represent a large order of gram negative bacteria, not a single organism. CRITICAL RESULT CALLED TO, READ BACK BY AND VERIFIED WITH:  NATHAN BELUE AT [id number]  06/23/20 SDR    Enterobacter cloacae complex NOT DETECTED NOT DETECTED Final   Escherichia coli DETECTED (A) NOT DETECTED Final    Comment: CRITICAL RESULT CALLED TO, READ BACK BY AND VERIFIED WITH:  NATHAN BELUE AT 0618 06/23/20 SDR    Klebsiella aerogenes NOT DETECTED NOT DETECTED Final   Klebsiella oxytoca NOT DETECTED NOT DETECTED Final   Klebsiella pneumoniae NOT DETECTED NOT DETECTED Final   Proteus species NOT DETECTED NOT DETECTED Final   Salmonella species NOT DETECTED NOT DETECTED Final   Serratia marcescens NOT DETECTED NOT DETECTED Final   Haemophilus influenzae NOT DETECTED NOT DETECTED Final   Neisseria meningitidis NOT DETECTED NOT DETECTED Final   Pseudomonas aeruginosa NOT DETECTED NOT DETECTED Final   Stenotrophomonas maltophilia NOT DETECTED NOT DETECTED Final   Candida albicans NOT DETECTED NOT DETECTED Final   Candida auris NOT DETECTED NOT DETECTED Final   Candida glabrata NOT DETECTED NOT DETECTED Final   Candida krusei NOT DETECTED NOT DETECTED Final   Candida parapsilosis NOT DETECTED NOT DETECTED Final   Candida tropicalis NOT DETECTED NOT DETECTED Final   Cryptococcus neoformans/gattii NOT DETECTED NOT DETECTED Final   CTX-M ESBL NOT DETECTED NOT DETECTED Final   Carbapenem resistance IMP NOT DETECTED NOT DETECTED Final   Carbapenem resistance KPC NOT DETECTED NOT DETECTED Final   Carbapenem resistance NDM NOT DETECTED NOT DETECTED Final   Carbapenem resist OXA 48 LIKE NOT DETECTED NOT DETECTED Final   Carbapenem resistance VIM NOT DETECTED NOT DETECTED Final    Comment: Performed at [facility], [address] [place], [place] [address]  Culture, blood (routine x 2)     Status: Abnormal   Collection Time: 06/22/20  6:08 PM   Specimen: BLOOD  Result Value Ref Range Status   Specimen Description   Final    BLOOD BLOOD LEFT HAND Performed at [facility], [address] [place], [place] [address]    Special Requests   Final  BOTTLES DRAWN AEROBIC ONLY Blood Culture results may not be optimal due to an inadequate volume of blood received in culture bottles Performed at [facility], [address] [place], [place] [address]    Culture  Setup Time   Final    GRAM NEGATIVE RODS AEROBIC BOTTLE ONLY CRITICAL VALUE NOTED.  VALUE IS CONSISTENT WITH PREVIOUSLY REPORTED AND CALLED VALUE. Performed at [facility], [address] [place], [place] [address]    Culture (A)  Final    ESCHERICHIA COLI SUSCEPTIBILITIES PERFORMED ON PREVIOUS CULTURE WITHIN THE LAST 5 DAYS. Performed at [facility], [place] [address] [place], [place] [address]    Report Status 06/25/2020 FINAL  Final  Resp Panel by RT-PCR (Flu A&B, Covid) Nasopharyngeal Swab     Status: None   Collection Time: 06/22/20  8:56 PM   Specimen: Nasopharyngeal Swab; Nasopharyngeal(NP) swabs in vial transport medium  Result Value Ref Range Status   SARS Coronavirus 2 by RT PCR NEGATIVE NEGATIVE Final    Comment: (NOTE) SARS-CoV-2 target nucleic acids are NOT DETECTED.  The SARS-CoV-2 RNA is generally detectable in upper respiratory specimens during the acute phase of infection. The lowest concentration of SARS-CoV-2 viral copies this assay can detect is 138 copies/mL. A negative result does not preclude SARS-Cov-2 infection and should not be used as the sole basis for treatment or other patient management decisions. A negative result may occur with  improper specimen collection/handling, submission of specimen other than nasopharyngeal swab, presence of viral mutation(s) within the areas targeted by this assay, and inadequate number of viral copies(<138 copies/mL). A negative result must be combined with clinical observations, patient history, and epidemiological information. The expected result is Negative.  Fact Sheet for Patients:  EntrepreneurPulse.com.au  Fact Sheet for Healthcare Providers:   IncredibleEmployment.be  This test is no t yet approved or cleared by the [place] FDA and  has been authorized for detection and/or diagnosis of SARS-CoV-2 by FDA under an Emergency Use Authorization (EUA). This EUA will remain  in effect (meaning this test can be used) for the duration of the COVID-19 declaration under Section 564(b)(1) of the Act, 21 U.S.C.section 360bbb-3(b)(1), unless the authorization is terminated  or revoked sooner.       Influenza A by PCR NEGATIVE NEGATIVE Final   Influenza B by PCR NEGATIVE NEGATIVE Final    Comment: (NOTE) The Xpert Xpress SARS-CoV-2/FLU/RSV plus assay is intended as an aid in the diagnosis of influenza from Nasopharyngeal swab specimens and should not be used as a sole basis for treatment. Nasal washings and aspirates are unacceptable for Xpert Xpress SARS-CoV-2/FLU/RSV testing.  Fact Sheet for Patients: EntrepreneurPulse.com.au  Fact Sheet for Healthcare Providers: IncredibleEmployment.be  This test is not yet approved or cleared by the [place] FDA and has been authorized for detection and/or diagnosis of SARS-CoV-2 by FDA under an Emergency Use Authorization (EUA). This EUA will remain in effect (meaning this test can be used) for the duration of the COVID-19 declaration under Section 564(b)(1) of the Act, 21 U.S.C. section 360bbb-3(b)(1), unless the authorization is terminated or revoked.  Performed at [facility], [address] [place], [place] [address]      Discharge Instructions:   Discharge Instructions    Diet - low sodium heart healthy   Complete by: As directed    Discharge instructions   Complete by: As directed    Wear TSO brace when out of bed   Face-to-face encounter (required for Medicare/Medicaid  patients)   Complete by: As directed    I Alexandria Roman certify that this patient is under my care and that I, or a nurse  practitioner or physician's assistant working with me, had a face-to-face encounter that meets the physician face-to-face encounter requirements with this patient on 06/26/2020. The encounter with the patient was in whole, or in part for the following medical condition(s) which is the primary reason for home health care (List medical condition): Debility, T1, T8 fracture   The encounter with the patient was in whole, or in part, for the following medical condition, which is the primary reason for home health care: Debility, T1, T8 fracture   I certify that, based on my findings, the following services are medically necessary home health services: Physical therapy   Reason for Medically Necessary Home Health Services: Therapy- Personnel officer, Public librarian   My clinical findings support the need for the above services: Unsafe ambulation due to balance issues   Further, I certify that my clinical findings support that this patient is homebound due to: Unsafe ambulation due to balance issues   Home Health   Complete by: As directed    To provide the following care/treatments:  OT PT     Increase activity slowly   Complete by: As directed      Allergies as of 06/26/2020      Reactions   Amoxicillin-pot Clavulanate Diarrhea   Cephalexin Other (See Comments), Nausea And Vomiting   Hydrochlorothiazide Other (See Comments), Nausea And Vomiting   Hydrocodone Bit-homatrop Mbr Other (See Comments)   Hydrocodone-chlorpheniramine Other (See Comments)   Metronidazole Other (See Comments)   Other reaction(s): Other (See Comments) Other Reaction: Not Assessed Other Reaction: Not Assessed   Moxifloxacin Other (See Comments)   Prednisone Nausea Only   Simvastatin Other (See Comments)   Sulfa Antibiotics Other (See Comments)   Other reaction(s): Unknown   Sulfasalazine Other (See Comments)   Other reaction(s): Unknown   Tramadol Other (See Comments)   Celecoxib Nausea And Vomiting    Codeine Nausea Only   Other reaction(s): Other (See Comments) Other reaction(s): Unknown   Cyclobenzaprine Other (See Comments), Nausea Only   Other reaction(s): Other (See Comments)   Oxycodone-acetaminophen Nausea Only, Nausea And Vomiting   Penicillin V Potassium Rash   Other reaction(s): Other (See Comments) Other reaction(s): Unknown      Medication List    TAKE these medications   Allergy Relief 10 MG tablet Generic drug: loratadine Take 10 mg by mouth daily.   amLODipine 2.5 MG tablet Commonly known as: NORVASC Take 1 tablet (2.5 mg total) by mouth daily.   calcitonin (salmon) 200 UNIT/ACT nasal spray Commonly known as: MIACALCIN/FORTICAL Place 1 spray into alternate nostrils daily.   cefdinir 300 MG capsule Commonly known as: OMNICEF Take 1 capsule (300 mg total) by mouth 2 (two) times daily for 3 days.   Combivent Respimat 20-100 MCG/ACT Aers respimat Generic drug: Ipratropium-Albuterol Inhale 2 puffs into the lungs in the morning, at noon, and at bedtime.   docusate sodium 100 MG capsule Commonly known as: COLACE Take 100 mg by mouth as needed for constipation.   DULoxetine 30 MG capsule Commonly known as: CYMBALTA Take 1 capsule by mouth 2 (two) times daily.   gabapentin 400 MG capsule Commonly known as: NEURONTIN Take 800 mg by mouth 2 (two) times daily as needed for pain.   latanoprost 0.005 % ophthalmic solution Commonly known as: XALATAN Place 1 drop into  both eyes at bedtime.   levothyroxine 75 MCG tablet Commonly known as: SYNTHROID Take 75 mcg by mouth daily.   metaxalone 800 MG tablet Commonly known as: SKELAXIN Take 400 mg by mouth 3 (three) times daily.   oxyCODONE 5 MG immediate release tablet Commonly known as: Oxy IR/ROXICODONE Take 0.5 tablets (2.5 mg total) by mouth every 4 (four) hours as needed for moderate pain or severe pain.   polyethylene glycol 17 g packet Commonly known as: MIRALAX / GLYCOLAX Take 17 g by mouth  daily.   PreserVision/Lutein Caps Take 1 capsule by mouth 2 (two) times daily.   timolol 0.5 % ophthalmic solution Commonly known as: TIMOPTIC Place 1 drop into both eyes daily.   Vitamin D3 1.25 MG (50000 UT) Caps Take 1 capsule by mouth once a week.         Time coordinating discharge: 33 minutes  Signed:  Marquelle Balow  [facility] 06/26/2020, 10:13 AM   Pager on [email or web address]. If 7PM-7AM, please contact night-coverage at [email or web address]

## 2020-06-26 NOTE — Progress Notes (Signed)
Physical Therapy Note:  Per TOC, front piece of CTO extension was not found yet.  TOC reported she would let the home health agency know that [employer] can fix pt's brace if they can find the missing piece (and [employer] then can check to see if adjustments need to be made to improve brace fit).  Goal is to improve pt's ability to wear brace with improved fit.  Above discussed with pt today and therapist also notified pt that if front piece of brace can not be found, a new one could be ordered through [employer].  Pt verbalizing understanding.  Plan for pt to discharge home today.  Leitha Bleak, PT 06/26/20, 2:34 PM

## 2020-06-26 NOTE — Progress Notes (Signed)
Patient became more concerned about not going.  Spoke with facility representative and she talked about how she wanted her ativan  - there was no food in the house.  But  representative stated that there was food and she had ativan at home if needed.  Discharge paperwork given to representative.

## 2020-07-03 ENCOUNTER — Other Ambulatory Visit: Payer: Self-pay | Admitting: Internal Medicine

## 2020-07-03 DIAGNOSIS — R1319 Other dysphagia: Secondary | ICD-10-CM

## 2020-07-03 DIAGNOSIS — R7881 Bacteremia: Secondary | ICD-10-CM

## 2020-07-21 ENCOUNTER — Ambulatory Visit: Payer: Medicare PPO | Attending: Internal Medicine

## 2020-08-04 ENCOUNTER — Ambulatory Visit
Admission: EM | Admit: 2020-08-04 | Discharge: 2020-08-04 | Disposition: A | Discharge status: Home / Self Care | Payer: Medicare PPO | Attending: Emergency Medicine | Admitting: Emergency Medicine

## 2020-08-04 ENCOUNTER — Encounter: Payer: Self-pay | Admitting: Emergency Medicine

## 2020-08-04 ENCOUNTER — Other Ambulatory Visit: Payer: Self-pay

## 2020-08-04 ENCOUNTER — Ambulatory Visit (INDEPENDENT_AMBULATORY_CARE_PROVIDER_SITE_OTHER): Payer: Medicare PPO

## 2020-08-04 DIAGNOSIS — S2232XA Fracture of one rib, left side, initial encounter for closed fracture: Secondary | ICD-10-CM | POA: Diagnosis not present

## 2020-08-04 DIAGNOSIS — J029 Acute pharyngitis, unspecified: Secondary | ICD-10-CM | POA: Diagnosis not present

## 2020-08-04 DIAGNOSIS — H9203 Otalgia, bilateral: Secondary | ICD-10-CM

## 2020-08-04 DIAGNOSIS — R079 Chest pain, unspecified: Secondary | ICD-10-CM | POA: Diagnosis not present

## 2020-08-04 NOTE — ED Triage Notes (Signed)
Pt presents today along with her personal aide. Pt reports having a fall early Friday morning onto buttocks and then falling backwards hitting head. -LOC. Her aide helped her up and she went to sleep. Pain in left ribs began after the fall.

## 2020-08-04 NOTE — ED Provider Notes (Signed)
Roderic Palau    CSN: [id number] Arrival date & time: 08/04/20  1450      History   Chief Complaint Chief Complaint  Patient presents with   Fall     HPI Alexandria Roman is a 87 y.o. female.  Accompanied by her caregiver, patient presents with left lateral rib pain after falling 4 days ago.  Patient fell onto her buttocks.  She states she thinks she may have hit her head but did not lose consciousness.  Patient also reports bilateral ear pain and sore throat for several weeks.  Her care provider states she has an appointment with ENT already scheduled.  She denies shortness of breath, fever, chills, chest pain, or other symptoms.  No treatments attempted at home.  Her medical history includes hypertension, CKD, chronic low back pain, osteoporosis.  The history is provided by the patient, a caregiver and medical records.   Past Medical History:  Diagnosis Date   Cancer [facility])    Hypertension     Patient Active Problem List   Diagnosis Date Noted   Severe sepsis ([place])    Cystitis    E. coli UTI    Chronic thoracic back pain    Sepsis due to gram-negative UTI ([place]) 06/22/2020   Nocturnal hypoxia    Weakness    Neuropathy    Glaucoma of both eyes    Thoracic compression fracture ([place]) 04/19/2020   Hip fracture ([place]) 05/19/2018   Depression with anxiety 12/09/2016   Numbness of foot 12/09/2016   Macular degeneration 12/09/2016   Gait instability 12/08/2016   Upper respiratory infection, viral 12/06/2016   Primary osteoarthritis of both knees 07/20/2016   Postmenopausal osteoporosis 04/15/2016   Chest pain 03/19/2016   Spinal stenosis 08/04/2015   Myalgia 06/11/2015   Polyarthralgia 06/11/2015   Chronic midline low back pain with bilateral sciatica 01/10/2015   Essential hypertension 06/04/2014   CKD (chronic kidney disease) stage 3, GFR 30-59 ml/min (HCC) 06/04/2014   Mixed hyperlipidemia 06/04/2014   Other allergic rhinitis 06/04/2014   DDD (degenerative  disc disease), lumbar 09/07/2013   Lumbar radiculitis 09/07/2013   Hypothyroidism, unspecified 08/02/2013    Past Surgical History:  Procedure Laterality Date   ABDOMINAL HYSTERECTOMY     HIP ARTHROPLASTY Right 05/20/2018   Procedure: ARTHROPLASTY BIPOLAR HIP (HEMIARTHROPLASTY);  Surgeon: Thornton Park, MD;  Location: [facility] ORS;  Service: Orthopedics;  Laterality: Right;    OB History   No obstetric history on file.      Home Medications    Prior to Admission medications   Medication Sig Start Date End Date Taking? Authorizing Provider  ALLERGY RELIEF 10 MG tablet Take 10 mg by mouth daily. 05/21/20   [provider]  amLODipine (NORVASC) 2.5 MG tablet Take 1 tablet (2.5 mg total) by mouth daily. 04/24/20   Sharen Hones, MD  calcitonin, salmon, (MIACALCIN/FORTICAL) 200 UNIT/ACT nasal spray Place 1 spray into alternate nostrils daily. 04/23/20   Sharen Hones, MD  Cholecalciferol (VITAMIN D3) 1.25 MG (50000 UT) CAPS Take 1 capsule by mouth once a week. 05/23/20   [provider]  COMBIVENT RESPIMAT 20-100 MCG/ACT AERS respimat Inhale 2 puffs into the lungs in the morning, at noon, and at bedtime. 05/03/19   [provider]  docusate sodium (COLACE) 100 MG capsule Take 100 mg by mouth as needed for constipation.    [provider]  DULoxetine (CYMBALTA) 30 MG capsule Take 1 capsule by mouth 2 (two) times daily. 11/03/16  [provider]  gabapentin (NEURONTIN) 400 MG capsule Take 800 mg by mouth 2 (two) times daily as needed for pain. 03/27/20   [provider]  latanoprost (XALATAN) 0.005 % ophthalmic solution Place 1 drop into both eyes at bedtime. 02/12/20   [provider]  levothyroxine (SYNTHROID) 75 MCG tablet Take 75 mcg by mouth daily. 06/12/20   [provider]  metaxalone (SKELAXIN) 800 MG tablet Take 400 mg by mouth 3 (three) times daily. 04/16/20   [provider]  Multiple Vitamins-Minerals  (PRESERVISION/LUTEIN) CAPS Take 1 capsule by mouth 2 (two) times daily.    [provider]  oxyCODONE (OXY IR/ROXICODONE) 5 MG immediate release tablet Take 0.5 tablets (2.5 mg total) by mouth every 4 (four) hours as needed for moderate pain or severe pain. 04/23/20   Sharen Hones, MD  polyethylene glycol (MIRALAX / GLYCOLAX) 17 g packet Take 17 g by mouth daily. 04/24/20   Sharen Hones, MD  timolol (TIMOPTIC) 0.5 % ophthalmic solution Place 1 drop into both eyes daily. 12/07/19   [provider]  enoxaparin (LOVENOX) 40 MG/0.4ML injection Inject 0.4 mLs (40 mg total) into the skin daily for 28 days. 05/24/18 05/13/19  Demetrios Loll, MD  losartan (COZAAR) 25 MG tablet Take 1 tablet (25 mg total) by mouth daily. 05/23/18 05/13/19  Demetrios Loll, MD  metoprolol succinate (TOPROL-XL) 25 MG 24 hr tablet Take 1 tablet by mouth daily. 03/21/19 01/28/20  [provider]    Family History Family History  Problem Relation Age of Onset   Testicular cancer Father     Social History Social History   Tobacco Use   Smoking status: Never   Smokeless tobacco: Never  Vaping Use   Vaping Use: Never used  Substance Use Topics   Alcohol use: No   Drug use: No     Allergies   Amoxicillin-pot clavulanate, Cephalexin, Hydrochlorothiazide, Hydrocodone bit-homatrop mbr, Hydrocodone-chlorpheniramine, Metronidazole, Moxifloxacin, Prednisone, Simvastatin, Sulfa antibiotics, Sulfasalazine, Tramadol, Celecoxib, Codeine, Cyclobenzaprine, Oxycodone-acetaminophen, and Penicillin v potassium   Review of Systems Review of Systems  Constitutional:  Negative for chills and fever.  HENT:  Positive for ear pain and sore throat.   Respiratory:  Negative for cough and shortness of breath.        Left rib pain.  Cardiovascular:  Negative for chest pain and palpitations.  Gastrointestinal:  Negative for abdominal pain and vomiting.  Skin:  Negative for color change and rash.  All other systems reviewed and  are negative.   Physical Exam Triage Vital Signs ED Triage Vitals  Enc Vitals Group     BP      Pulse      Resp      Temp      Temp src      SpO2      Weight      Height      Head Circumference      Peak Flow      Pain Score      Pain Loc      Pain Edu?      Excl. in [place]?    No data found.  Updated Vital Signs BP 115/75 (BP Location: Left Arm)   Pulse 76   Temp 98.1 F (36.7 C) (Oral)   Resp 18   SpO2 93%   Visual Acuity Right Eye Distance:   Left Eye Distance:   Bilateral Distance:    Right Eye Near:   Left Eye Near:  Bilateral Near:     Physical Exam Vitals and nursing note reviewed.  Constitutional:      General: She is not in acute distress.    Appearance: She is well-developed. She is not ill-appearing.  HENT:     Head: Normocephalic and atraumatic.     Right Ear: Tympanic membrane normal.     Left Ear: Tympanic membrane normal.     Nose: Nose normal.     Mouth/Throat:     Mouth: Mucous membranes are moist.     Pharynx: Oropharynx is clear.  Eyes:     Conjunctiva/sclera: Conjunctivae normal.  Cardiovascular:     Rate and Rhythm: Normal rate and regular rhythm.     Heart sounds: Normal heart sounds.  Pulmonary:     Effort: Pulmonary effort is normal. No respiratory distress.     Breath sounds: Normal breath sounds. No wheezing, rhonchi or rales.  Chest:     Chest wall: Tenderness present.    Abdominal:     Palpations: Abdomen is soft.     Tenderness: There is no abdominal tenderness.  Musculoskeletal:     Cervical back: Neck supple.  Skin:    General: Skin is warm and dry.     Findings: No bruising, erythema, lesion or rash.  Neurological:     Mental Status: She is alert. Mental status is at baseline.  Psychiatric:        Mood and Affect: Mood normal.        Behavior: Behavior normal.     UC Treatments / Results  Labs (all labs ordered are listed, but only abnormal results are displayed) Labs Reviewed - No data to  display  EKG   Radiology DG Ribs Unilateral W/Chest Left  Result Date: 08/04/2020 CLINICAL DATA:  [place] 3 days ago EXAM: LEFT RIBS AND CHEST - 3+ VIEW COMPARISON:  04/19/2020 FINDINGS: Single-view chest demonstrates no focal opacity or pneumothorax. Small left pleural effusion. Large hiatal hernia. Left rib series slightly limited by linear artifact. Possible left fifth anterolateral rib fracture. IMPRESSION: Possible left fifth anterolateral rib fracture. Negative for pneumothorax. Possible small left effusion. Large hiatal hernia Electronically Signed   By: Donavan Foil M.D.   On: 08/04/2020 15:54    Procedures Procedures (including critical care time)  Medications Ordered in UC Medications - No data to display  Initial Impression / Assessment and Plan / UC Course  I have reviewed the triage vital signs and the nursing notes.  Pertinent labs & imaging results that were available during my care of the patient were reviewed by me and considered in my medical decision making (see chart for details).  Left fifth rib fracture, otalgia, sore throat.  X-ray shows "possible left fifth anterior lateral rib fracture.  Negative for pneumothorax.  Possible small left effusion."  Discussed Tylenol as needed for discomfort.  Instructed patient and caregiver to make sure she is taking deep breaths; discussed prevention of pneumonia.  Strict ED precautions discussed if patient develops shortness of breath or other concerning symptoms.  Instructed her to follow-up with her PCP tomorrow.  Patient and her caregiver agree to plan of care.   Final Clinical Impressions(s) / UC Diagnoses   Final diagnoses:  Closed fracture of one rib of left side, initial encounter  Otalgia of both ears  Sore throat     Discharge Instructions      Go to the emergency department if you have acute shortness of breath or other concerning symptoms.    Follow-up  with your primary care provider tomorrow.         ED  Prescriptions   None    PDMP not reviewed this encounter.   Sharion Balloon, NP 08/04/20 1616

## 2020-08-04 NOTE — Discharge Instructions (Addendum)
Go to the emergency department if you have acute shortness of breath or other concerning symptoms.    Follow-up with your primary care provider tomorrow.

## 2020-08-05 ENCOUNTER — Ambulatory Visit
Admission: RE | Admit: 2020-08-05 | Discharge: 2020-08-05 | Disposition: A | Discharge status: Home / Self Care | Payer: Medicare PPO | Source: Ambulatory Visit | Attending: Internal Medicine | Admitting: Internal Medicine

## 2020-08-05 ENCOUNTER — Emergency Department: Payer: Medicare PPO

## 2020-08-05 ENCOUNTER — Other Ambulatory Visit: Payer: Self-pay

## 2020-08-05 ENCOUNTER — Ambulatory Visit: Payer: Self-pay

## 2020-08-05 ENCOUNTER — Encounter: Payer: Self-pay | Admitting: Emergency Medicine

## 2020-08-05 ENCOUNTER — Inpatient Hospital Stay
Admission: EM | Admit: 2020-08-05 | Discharge: 2020-08-10 | DRG: 871 | Disposition: A | Discharge status: Home / Self Care | Payer: Medicare PPO | Attending: Internal Medicine | Admitting: Internal Medicine

## 2020-08-05 VITALS — BP 105/71 | HR 125 | Temp 98.0°F

## 2020-08-05 DIAGNOSIS — Z79899 Other long term (current) drug therapy: Secondary | ICD-10-CM

## 2020-08-05 DIAGNOSIS — G8929 Other chronic pain: Secondary | ICD-10-CM | POA: Diagnosis present

## 2020-08-05 DIAGNOSIS — S2242XD Multiple fractures of ribs, left side, subsequent encounter for fracture with routine healing: Secondary | ICD-10-CM | POA: Diagnosis not present

## 2020-08-05 DIAGNOSIS — I129 Hypertensive chronic kidney disease with stage 1 through stage 4 chronic kidney disease, or unspecified chronic kidney disease: Secondary | ICD-10-CM | POA: Diagnosis present

## 2020-08-05 DIAGNOSIS — I517 Cardiomegaly: Secondary | ICD-10-CM | POA: Diagnosis present

## 2020-08-05 DIAGNOSIS — S2242XA Multiple fractures of ribs, left side, initial encounter for closed fracture: Secondary | ICD-10-CM | POA: Diagnosis not present

## 2020-08-05 DIAGNOSIS — J9601 Acute respiratory failure with hypoxia: Secondary | ICD-10-CM | POA: Diagnosis present

## 2020-08-05 DIAGNOSIS — N179 Acute kidney failure, unspecified: Secondary | ICD-10-CM | POA: Diagnosis present

## 2020-08-05 DIAGNOSIS — Z882 Allergy status to sulfonamides status: Secondary | ICD-10-CM | POA: Diagnosis not present

## 2020-08-05 DIAGNOSIS — E039 Hypothyroidism, unspecified: Secondary | ICD-10-CM | POA: Diagnosis present

## 2020-08-05 DIAGNOSIS — E785 Hyperlipidemia, unspecified: Secondary | ICD-10-CM | POA: Diagnosis present

## 2020-08-05 DIAGNOSIS — A419 Sepsis, unspecified organism: Secondary | ICD-10-CM | POA: Diagnosis not present

## 2020-08-05 DIAGNOSIS — I10 Essential (primary) hypertension: Secondary | ICD-10-CM | POA: Diagnosis not present

## 2020-08-05 DIAGNOSIS — N390 Urinary tract infection, site not specified: Secondary | ICD-10-CM | POA: Diagnosis present

## 2020-08-05 DIAGNOSIS — B9620 Unspecified Escherichia coli [E. coli] as the cause of diseases classified elsewhere: Secondary | ICD-10-CM | POA: Diagnosis present

## 2020-08-05 DIAGNOSIS — K449 Diaphragmatic hernia without obstruction or gangrene: Secondary | ICD-10-CM | POA: Diagnosis present

## 2020-08-05 DIAGNOSIS — F418 Other specified anxiety disorders: Secondary | ICD-10-CM

## 2020-08-05 DIAGNOSIS — M546 Pain in thoracic spine: Secondary | ICD-10-CM

## 2020-08-05 DIAGNOSIS — N183 Chronic kidney disease, stage 3 unspecified: Secondary | ICD-10-CM | POA: Diagnosis present

## 2020-08-05 DIAGNOSIS — R9431 Abnormal electrocardiogram [ECG] [EKG]: Secondary | ICD-10-CM | POA: Diagnosis present

## 2020-08-05 DIAGNOSIS — J189 Pneumonia, unspecified organism: Secondary | ICD-10-CM | POA: Diagnosis present

## 2020-08-05 DIAGNOSIS — Z96641 Presence of right artificial hip joint: Secondary | ICD-10-CM | POA: Diagnosis present

## 2020-08-05 DIAGNOSIS — F32A Depression, unspecified: Secondary | ICD-10-CM | POA: Diagnosis present

## 2020-08-05 DIAGNOSIS — I160 Hypertensive urgency: Secondary | ICD-10-CM | POA: Diagnosis not present

## 2020-08-05 DIAGNOSIS — G9341 Metabolic encephalopathy: Secondary | ICD-10-CM | POA: Diagnosis present

## 2020-08-05 DIAGNOSIS — F419 Anxiety disorder, unspecified: Secondary | ICD-10-CM | POA: Diagnosis present

## 2020-08-05 DIAGNOSIS — W19XXXA Unspecified fall, initial encounter: Secondary | ICD-10-CM | POA: Diagnosis present

## 2020-08-05 DIAGNOSIS — Y95 Nosocomial condition: Secondary | ICD-10-CM | POA: Diagnosis present

## 2020-08-05 DIAGNOSIS — Z20822 Contact with and (suspected) exposure to covid-19: Secondary | ICD-10-CM | POA: Diagnosis present

## 2020-08-05 DIAGNOSIS — R4182 Altered mental status, unspecified: Secondary | ICD-10-CM

## 2020-08-05 DIAGNOSIS — E871 Hypo-osmolality and hyponatremia: Secondary | ICD-10-CM | POA: Diagnosis present

## 2020-08-05 DIAGNOSIS — E876 Hypokalemia: Secondary | ICD-10-CM | POA: Diagnosis not present

## 2020-08-05 DIAGNOSIS — N3000 Acute cystitis without hematuria: Secondary | ICD-10-CM | POA: Diagnosis not present

## 2020-08-05 DIAGNOSIS — Z880 Allergy status to penicillin: Secondary | ICD-10-CM

## 2020-08-05 DIAGNOSIS — S2243XA Multiple fractures of ribs, bilateral, initial encounter for closed fracture: Secondary | ICD-10-CM | POA: Diagnosis present

## 2020-08-05 DIAGNOSIS — W19XXXD Unspecified fall, subsequent encounter: Secondary | ICD-10-CM | POA: Diagnosis not present

## 2020-08-05 DIAGNOSIS — Z809 Family history of malignant neoplasm, unspecified: Secondary | ICD-10-CM

## 2020-08-05 DIAGNOSIS — Z885 Allergy status to narcotic agent status: Secondary | ICD-10-CM | POA: Diagnosis not present

## 2020-08-05 DIAGNOSIS — Z90710 Acquired absence of both cervix and uterus: Secondary | ICD-10-CM

## 2020-08-05 DIAGNOSIS — M4854XA Collapsed vertebra, not elsewhere classified, thoracic region, initial encounter for fracture: Secondary | ICD-10-CM | POA: Diagnosis present

## 2020-08-05 DIAGNOSIS — R000 Tachycardia, unspecified: Secondary | ICD-10-CM | POA: Diagnosis not present

## 2020-08-05 DIAGNOSIS — R0902 Hypoxemia: Secondary | ICD-10-CM | POA: Diagnosis not present

## 2020-08-05 DIAGNOSIS — Z888 Allergy status to other drugs, medicaments and biological substances status: Secondary | ICD-10-CM | POA: Diagnosis not present

## 2020-08-05 DIAGNOSIS — R6520 Severe sepsis without septic shock: Secondary | ICD-10-CM | POA: Diagnosis present

## 2020-08-05 DIAGNOSIS — S2232XA Fracture of one rib, left side, initial encounter for closed fracture: Secondary | ICD-10-CM | POA: Diagnosis present

## 2020-08-05 LAB — CBC
HCT: 36.9 % (ref 36.0–46.0)
Hemoglobin: 12.2 g/dL (ref 12.0–15.0)
MCH: 29 pg (ref 26.0–34.0)
MCHC: 33.1 g/dL (ref 30.0–36.0)
MCV: 87.9 fL (ref 80.0–100.0)
Platelets: 246 10*3/uL (ref 150–400)
RBC: 4.2 MIL/uL (ref 3.87–5.11)
RDW: 15.4 % (ref 11.5–15.5)
WBC: 17.1 10*3/uL — ABNORMAL HIGH (ref 4.0–10.5)
nRBC: 0 % (ref 0.0–0.2)

## 2020-08-05 LAB — URINALYSIS, COMPLETE (UACMP) WITH MICROSCOPIC
Bilirubin Urine: NEGATIVE
Glucose, UA: NEGATIVE mg/dL
Hgb urine dipstick: NEGATIVE
Ketones, ur: NEGATIVE mg/dL
Nitrite: NEGATIVE
Protein, ur: 100 mg/dL — AB
Specific Gravity, Urine: 1.018 (ref 1.005–1.030)
WBC, UA: >50 WBC/hpf — ABNORMAL HIGH (ref 0–5)
pH: 5 (ref 5.0–8.0)

## 2020-08-05 LAB — COMPREHENSIVE METABOLIC PANEL
ALT: 10 U/L (ref 0–44)
AST: 23 U/L (ref 15–41)
Albumin: 3.4 g/dL — ABNORMAL LOW (ref 3.5–5.0)
Alkaline Phosphatase: 69 U/L (ref 38–126)
Anion gap: 8 (ref 5–15)
BUN: 26 mg/dL — ABNORMAL HIGH (ref 8–23)
CO2: 22 mmol/L (ref 22–32)
Calcium: 8.6 mg/dL — ABNORMAL LOW (ref 8.9–10.3)
Chloride: 102 mmol/L (ref 98–111)
Creatinine, Ser: 1.26 mg/dL — ABNORMAL HIGH (ref 0.44–1.00)
GFR, Estimated: 41 mL/min — ABNORMAL LOW (ref >60)
Glucose, Bld: 167 mg/dL — ABNORMAL HIGH (ref 70–99)
Potassium: 3.6 mmol/L (ref 3.5–5.1)
Sodium: 132 mmol/L — ABNORMAL LOW (ref 135–145)
Total Bilirubin: 0.8 mg/dL (ref 0.3–1.2)
Total Protein: 6.3 g/dL — ABNORMAL LOW (ref 6.5–8.1)

## 2020-08-05 LAB — BLOOD GAS, VENOUS
Acid-base deficit: 0.4 mmol/L (ref 0.0–2.0)
Bicarbonate: 24.8 mmol/L (ref 20.0–28.0)
O2 Saturation: 76.3 %
Patient temperature: 37
pCO2, Ven: 42 mmHg — ABNORMAL LOW (ref 44.0–60.0)
pH, Ven: 7.38 (ref 7.250–7.430)
pO2, Ven: 42 mmHg (ref 32.0–45.0)

## 2020-08-05 LAB — URINE DRUG SCREEN, QUALITATIVE (ARMC ONLY)
Amphetamines, Ur Screen: NOT DETECTED
Barbiturates, Ur Screen: NOT DETECTED
Benzodiazepine, Ur Scrn: NOT DETECTED
Cannabinoid 50 Ng, Ur ~~LOC~~: NOT DETECTED
Cocaine Metabolite,Ur ~~LOC~~: NOT DETECTED
MDMA (Ecstasy)Ur Screen: NOT DETECTED
Methadone Scn, Ur: NOT DETECTED
Opiate, Ur Screen: NOT DETECTED
Phencyclidine (PCP) Ur S: NOT DETECTED
Tricyclic, Ur Screen: NOT DETECTED

## 2020-08-05 LAB — RESP PANEL BY RT-PCR (FLU A&B, COVID) ARPGX2
Influenza A by PCR: NEGATIVE
Influenza B by PCR: NEGATIVE
SARS Coronavirus 2 by RT PCR: NEGATIVE

## 2020-08-05 LAB — PROTIME-INR
INR: 1.1 (ref 0.8–1.2)
Prothrombin Time: 13.7 seconds (ref 11.4–15.2)

## 2020-08-05 LAB — LACTIC ACID, PLASMA
Lactic Acid, Venous: 2.7 mmol/L (ref 0.5–1.9)
Lactic Acid, Venous: 2.4 mmol/L (ref 0.5–1.9)
Lactic Acid, Venous: 2.5 mmol/L (ref 0.5–1.9)
Lactic Acid, Venous: 1.4 mmol/L (ref 0.5–1.9)

## 2020-08-05 LAB — BRAIN NATRIURETIC PEPTIDE: B Natriuretic Peptide: 202.4 pg/mL — ABNORMAL HIGH (ref 0.0–100.0)

## 2020-08-05 LAB — APTT: aPTT: 24 seconds (ref 24–36)

## 2020-08-05 LAB — PROCALCITONIN: Procalcitonin: 4.76 ng/mL

## 2020-08-05 LAB — TROPONIN I (HIGH SENSITIVITY)
Troponin I (High Sensitivity): 54 ng/L — ABNORMAL HIGH (ref <18)
Troponin I (High Sensitivity): 40 ng/L — ABNORMAL HIGH (ref <18)

## 2020-08-05 MED ORDER — VANCOMYCIN HCL 1250 MG/250ML IV SOLN: 1250.0000 mg | INTRAVENOUS | Status: DC

## 2020-08-05 MED ORDER — SODIUM CHLORIDE 0.9 % IV SOLN
2.0000 g | INTRAVENOUS | Status: DC
  Administered 2020-08-05: 2 g via INTRAVENOUS
  Filled 2020-08-05 (×2): qty 2

## 2020-08-05 MED ORDER — LACTATED RINGERS IV BOLUS
1000.0000 mL | Freq: Once | INTRAVENOUS | Status: AC
  Administered 2020-08-05: 1000 mL via INTRAVENOUS

## 2020-08-05 MED ORDER — HYDRALAZINE HCL 20 MG/ML IJ SOLN
5.0000 mg | INTRAMUSCULAR | Status: DC | PRN
  Administered 2020-08-07 – 2020-08-09 (×2): 5 mg via INTRAVENOUS
  Filled 2020-08-05 (×3): qty 1

## 2020-08-05 MED ORDER — LACTATED RINGERS IV SOLN: INTRAVENOUS | Status: DC

## 2020-08-05 MED ORDER — ONDANSETRON HCL 4 MG/2ML IJ SOLN: 4.0000 mg | Freq: Three times a day (TID) | INTRAMUSCULAR | Status: DC | PRN

## 2020-08-05 MED ORDER — SODIUM CHLORIDE 0.9 % IV SOLN
500.0000 mg | INTRAVENOUS | Status: DC
  Administered 2020-08-05: 500 mg via INTRAVENOUS
  Filled 2020-08-05: qty 500

## 2020-08-05 MED ORDER — DM-GUAIFENESIN ER 30-600 MG PO TB12: 1.0000 | ORAL_TABLET | Freq: Two times a day (BID) | ORAL | Status: DC | PRN

## 2020-08-05 MED ORDER — LACTATED RINGERS IV BOLUS: 500.0000 mL | Freq: Once | INTRAVENOUS | Status: DC

## 2020-08-05 MED ORDER — SODIUM CHLORIDE 0.9 % IV SOLN
2.0000 g | INTRAVENOUS | Status: DC
  Administered 2020-08-05: 2 g via INTRAVENOUS
  Filled 2020-08-05: qty 20

## 2020-08-05 MED ORDER — ACETAMINOPHEN 325 MG PO TABS
650.0000 mg | ORAL_TABLET | Freq: Four times a day (QID) | ORAL | Status: DC | PRN
  Administered 2020-08-06 – 2020-08-10 (×6): 650 mg via ORAL
  Filled 2020-08-05 (×7): qty 2

## 2020-08-05 MED ORDER — VANCOMYCIN HCL 1500 MG/300ML IV SOLN
1500.0000 mg | Freq: Once | INTRAVENOUS | Status: AC
  Administered 2020-08-05: 1500 mg via INTRAVENOUS
  Filled 2020-08-05: qty 300

## 2020-08-05 MED ORDER — ALBUTEROL SULFATE (2.5 MG/3ML) 0.083% IN NEBU: 2.5000 mg | INHALATION_SOLUTION | RESPIRATORY_TRACT | Status: DC | PRN

## 2020-08-05 MED ORDER — LACTATED RINGERS IV SOLN
INTRAVENOUS | Status: DC
  Administered 2020-08-05: 1000 mL via INTRAVENOUS

## 2020-08-05 NOTE — ED Notes (Signed)
Home health nurse in the pt   Pt is speaking more clearly and is aware of what is going on  But confused at times

## 2020-08-05 NOTE — ED Triage Notes (Signed)
Pt presents today with home health nurse with c/o of Altered Mental Status that began this morning. Pt placed in room. VS taken. K.Hall Busing NP at bedside.

## 2020-08-05 NOTE — ED Triage Notes (Signed)
Presents via EMS from [facility]  per EMS she had a fall last Thursday  was seen and dx'd with rib fx  care giver states she is confused today    On arrival to ED pt is very lethargic   is alert name and DOB  unsure why she is here

## 2020-08-05 NOTE — ED Notes (Signed)
This tech bladder scanned pt. It was registering 552mls. RN notified.

## 2020-08-05 NOTE — Sepsis Progress Note (Signed)
Code Sepsis protocol being monitored by eLink. 

## 2020-08-05 NOTE — Consult Note (Signed)
Pharmacy Antibiotic Note  Alexandria Roman is a 87 y.o. female admitted on 08/05/2020 with pneumonia.  Pharmacy has been consulted for Vancomycin and Cefepime dosing. Patient received single doses of rocephin and Azithromycin in ED.  Plan: Vancomycin 1250 mg IV Q 48 hrs. Goal AUC 400-550. Expected AUC: 478.3 Expected Css: 10.3 SCr used: 1.26   Height: 5\' 4"  (162.6 cm) Weight: 68.4 kg (150 lb 12.7 oz) IBW/kg (Calculated) : 54.7  Temp (24hrs), Avg:98.3 F (36.8 C), Min:98 F (36.7 C), Max:98.5 F (36.9 C)  Recent Labs  Lab 08/05/20 1350 08/05/20 1610  WBC 17.1*  --   CREATININE 1.26*  --   LATICACIDVEN 2.7* 2.4*    Estimated Creatinine Clearance: 29.3 mL/min (A) (by C-G formula based on SCr of 1.26 mg/dL (H)).    Allergies  Allergen Reactions   Amoxicillin-Pot Clavulanate Diarrhea   Cephalexin Other (See Comments) and Nausea And Vomiting   Hydrochlorothiazide Other (See Comments) and Nausea And Vomiting   Hydrocodone Bit-Homatrop Mbr Other (See Comments)   Hydrocodone-Chlorpheniramine Other (See Comments)   Metronidazole Other (See Comments)    Other reaction(s): Other (See Comments) Other Reaction: Not Assessed Other Reaction: Not Assessed   Moxifloxacin Other (See Comments)   Prednisone Nausea Only   Simvastatin Other (See Comments)   Sulfa Antibiotics Other (See Comments)    Other reaction(s): Unknown   Sulfasalazine Other (See Comments)    Other reaction(s): Unknown   Tramadol Other (See Comments)   Celecoxib Nausea And Vomiting   Codeine Nausea Only    Other reaction(s): Other (See Comments) Other reaction(s): Unknown   Cyclobenzaprine Other (See Comments) and Nausea Only    Other reaction(s): Other (See Comments)   Oxycodone-Acetaminophen Nausea Only and Nausea And Vomiting   Penicillin V Potassium Rash    Other reaction(s): Other (See Comments) Other reaction(s): Unknown    Antimicrobials this admission: Vancomycin 6/21 >>  Cefepime 6/21 >>   Azithro/Rocephin 6/21 x 1  Microbiology results: 6/21 BCx: pending 6/21 UCx: pending   Thank you for allowing pharmacy to be a part of this patient's care.  Lance Coon A Hannan Tetzlaff 08/05/2020 6:03 PM

## 2020-08-05 NOTE — ED Notes (Signed)
EMS at bedside for transport

## 2020-08-05 NOTE — Progress Notes (Addendum)
CODE SEPSIS - PHARMACY COMMUNICATION  **Broad Spectrum Antibiotics should be administered within 1 hour of Sepsis diagnosis**  Time Code Sepsis Called/Page Received: 1408  Antibiotics Ordered: CTX, Azithro  Time of 1st antibiotic administration: [place] Margalit Leece ,PharmD Clinical Pharmacist  08/05/2020  4:21 PM

## 2020-08-05 NOTE — Sepsis Progress Note (Signed)
Notified bedside nurse of need to administer antibiotics and fluid bolus.  

## 2020-08-05 NOTE — ED Provider Notes (Signed)
[facility] Emergency Department Provider Note    Event Date/Time   First MD Initiated Contact with Patient 08/05/20 1356     (approximate)  I have reviewed the triage vital signs and the nursing notes.   HISTORY  Chief Complaint Altered Mental Status    HPI Alexandria Roman is a 87 y.o. female with the below listed past medical history presents to the ER for evaluation of altered mental status shortness of breath status post fall last Thursday diagnosed with rib fracture at urgent care yesterday Alexandria Roman presents to the urgent care with increasing confusion found to be hypoxic.  Patient is drowsy but protecting her airway.  Not currently on antibiotics.  States that when she fell last Thursday did strike her head.  Refused to come to the ER at that point.  Past Medical History:  Diagnosis Date   Cancer ([place])    Hypertension    Family History  Problem Relation Age of Onset   Testicular cancer Father    Past Surgical History:  Procedure Laterality Date   ABDOMINAL HYSTERECTOMY     HIP ARTHROPLASTY Right 05/20/2018   Procedure: ARTHROPLASTY BIPOLAR HIP (HEMIARTHROPLASTY);  Surgeon: Thornton Park, MD;  Location: [facility] ORS;  Service: Orthopedics;  Laterality: Right;   Patient Active Problem List   Diagnosis Date Noted   Severe sepsis ([place])    Cystitis    E. coli UTI    Chronic thoracic back pain    Sepsis due to gram-negative UTI ([place]) 06/22/2020   Nocturnal hypoxia    Weakness    Neuropathy    Glaucoma of both eyes    Thoracic compression fracture ([place]) 04/19/2020   Hip fracture ([place]) 05/19/2018   Depression with anxiety 12/09/2016   Numbness of foot 12/09/2016   Macular degeneration 12/09/2016   Gait instability 12/08/2016   Upper respiratory infection, viral 12/06/2016   Primary osteoarthritis of both knees 07/20/2016   Postmenopausal osteoporosis 04/15/2016   Chest pain 03/19/2016   Spinal stenosis 08/04/2015   Myalgia 06/11/2015    Polyarthralgia 06/11/2015   Chronic midline low back pain with bilateral sciatica 01/10/2015   Essential hypertension 06/04/2014   CKD (chronic kidney disease) stage 3, GFR 30-59 ml/min (HCC) 06/04/2014   Mixed hyperlipidemia 06/04/2014   Other allergic rhinitis 06/04/2014   DDD (degenerative disc disease), lumbar 09/07/2013   Lumbar radiculitis 09/07/2013   Hypothyroidism, unspecified 08/02/2013      Prior to Admission medications   Medication Sig Start Date End Date Taking? Authorizing Provider  ALLERGY RELIEF 10 MG tablet Take 10 mg by mouth daily. 05/21/20   [provider]  amLODipine (NORVASC) 2.5 MG tablet Take 1 tablet (2.5 mg total) by mouth daily. 04/24/20   Sharen Hones, MD  calcitonin, salmon, (MIACALCIN/FORTICAL) 200 UNIT/ACT nasal spray Place 1 spray into alternate nostrils daily. 04/23/20   Sharen Hones, MD  Cholecalciferol (VITAMIN D3) 1.25 MG (50000 UT) CAPS Take 1 capsule by mouth once a week. 05/23/20   [provider]  COMBIVENT RESPIMAT 20-100 MCG/ACT AERS respimat Inhale 2 puffs into the lungs in the morning, at noon, and at bedtime. 05/03/19   [provider]  docusate sodium (COLACE) 100 MG capsule Take 100 mg by mouth as needed for constipation.    [provider]  DULoxetine (CYMBALTA) 30 MG capsule Take 1 capsule by mouth 2 (two) times daily. 11/03/16   [provider]  gabapentin (NEURONTIN) 400 MG capsule Take 800 mg by mouth 2 (two) times  daily as needed for pain. 03/27/20   [provider]  latanoprost (XALATAN) 0.005 % ophthalmic solution Place 1 drop into both eyes at bedtime. 02/12/20   [provider]  levothyroxine (SYNTHROID) 75 MCG tablet Take 75 mcg by mouth daily. 06/12/20   [provider]  metaxalone (SKELAXIN) 800 MG tablet Take 400 mg by mouth 3 (three) times daily. 04/16/20   [provider]  Multiple Vitamins-Minerals (PRESERVISION/LUTEIN) CAPS Take 1 capsule by mouth 2 (two)  times daily.    [provider]  oxyCODONE (OXY IR/ROXICODONE) 5 MG immediate release tablet Take 0.5 tablets (2.5 mg total) by mouth every 4 (four) hours as needed for moderate pain or severe pain. 04/23/20   Sharen Hones, MD  polyethylene glycol (MIRALAX / GLYCOLAX) 17 g packet Take 17 g by mouth daily. 04/24/20   Sharen Hones, MD  timolol (TIMOPTIC) 0.5 % ophthalmic solution Place 1 drop into both eyes daily. 12/07/19   [provider]  enoxaparin (LOVENOX) 40 MG/0.4ML injection Inject 0.4 mLs (40 mg total) into the skin daily for 28 days. 05/24/18 05/13/19  Demetrios Loll, MD  losartan (COZAAR) 25 MG tablet Take 1 tablet (25 mg total) by mouth daily. 05/23/18 05/13/19  Demetrios Loll, MD  metoprolol succinate (TOPROL-XL) 25 MG 24 hr tablet Take 1 tablet by mouth daily. 03/21/19 01/28/20  [provider]    Allergies Amoxicillin-pot clavulanate, Cephalexin, Hydrochlorothiazide, Hydrocodone bit-homatrop mbr, Hydrocodone-chlorpheniramine, Metronidazole, Moxifloxacin, Prednisone, Simvastatin, Sulfa antibiotics, Sulfasalazine, Tramadol, Celecoxib, Codeine, Cyclobenzaprine, Oxycodone-acetaminophen, and Penicillin v potassium    Social History Social History   Tobacco Use   Smoking status: Never   Smokeless tobacco: Never  Vaping Use   Vaping Use: Never used  Substance Use Topics   Alcohol use: No   Drug use: No    Review of Systems Patient denies headaches, rhinorrhea, blurry vision, numbness, shortness of breath, chest pain, edema, cough, abdominal pain, nausea, vomiting, diarrhea, dysuria, fevers, rashes or hallucinations unless otherwise stated above in HPI. ____________________________________________   PHYSICAL EXAM:  VITAL SIGNS: Vitals:   08/05/20 1533 08/05/20 1602  BP: (!) 95/55 (!) 99/48  Pulse: (!) 101 95  Resp: 14 (!) 21  Temp:    SpO2: 95% 95%    Constitutional: Alert and oriented.  Eyes: Conjunctivae are normal.  Head: Atraumatic. Nose: No  congestion/rhinnorhea. Mouth/Throat: Mucous membranes are moist.   Neck: No stridor. Painless ROM.  Cardiovascular: Normal rate, regular rhythm. Grossly normal heart sounds.  Good peripheral circulation. Respiratory: Normal respiratory effort.  No retractions. Lungs CTAB. Gastrointestinal: Soft and nontender. No distention. No abdominal bruits. No CVA tenderness. Genitourinary:  Musculoskeletal: No lower extremity tenderness nor edema.  No joint effusions. Neurologic:  Normal speech and language. No gross focal neurologic deficits are appreciated. No facial droop Skin:  Skin is warm, dry and intact. No rash noted. Psychiatric: Mood and affect are normal. Speech and behavior are normal.  ____________________________________________   LABS (all labs ordered are listed, but only abnormal results are displayed)  Results for orders placed or performed during the hospital encounter of 08/05/20 (from the past 24 hour(s))  Comprehensive metabolic panel     Status: Abnormal   Collection Time: 08/05/20  1:50 PM  Result Value Ref Range   Sodium 132 (L) 135 - 145 mmol/L   Potassium 3.6 3.5 - 5.1 mmol/L   Chloride 102 98 - 111 mmol/L   CO2 22 22 - 32 mmol/L   Glucose, Bld 167 (H) 70 - 99 mg/dL  BUN 26 (H) 8 - 23 mg/dL   Creatinine, Ser 1.26 (H) 0.44 - 1.00 mg/dL   Calcium 8.6 (L) 8.9 - 10.3 mg/dL   Total Protein 6.3 (L) 6.5 - 8.1 g/dL   Albumin 3.4 (L) 3.5 - 5.0 g/dL   AST 23 15 - 41 U/L   ALT 10 0 - 44 U/L   Alkaline Phosphatase 69 38 - 126 U/L   Total Bilirubin 0.8 0.3 - 1.2 mg/dL   GFR, Estimated 41 (L) >60 mL/min   Anion gap 8 5 - 15  CBC     Status: Abnormal   Collection Time: 08/05/20  1:50 PM  Result Value Ref Range   WBC 17.1 (H) 4.0 - 10.5 K/uL   RBC 4.20 3.87 - 5.11 MIL/uL   Hemoglobin 12.2 12.0 - 15.0 g/dL   HCT 36.9 36.0 - 46.0 %   MCV 87.9 80.0 - 100.0 fL   MCH 29.0 26.0 - 34.0 pg   MCHC 33.1 30.0 - 36.0 g/dL   RDW 15.4 11.5 - 15.5 %   Platelets 246 150 - 400 K/uL    nRBC 0.0 0.0 - 0.2 %  Lactic acid, plasma     Status: Abnormal   Collection Time: 08/05/20  1:50 PM  Result Value Ref Range   Lactic Acid, Venous 2.7 (HH) 0.5 - 1.9 mmol/L  Protime-INR     Status: None   Collection Time: 08/05/20  1:50 PM  Result Value Ref Range   Prothrombin Time 13.7 11.4 - 15.2 seconds   INR 1.1 0.8 - 1.2  APTT     Status: None   Collection Time: 08/05/20  1:50 PM  Result Value Ref Range   aPTT 24 24 - 36 seconds  Resp Panel by RT-PCR (Flu A&B, Covid) Nasopharyngeal Swab     Status: None   Collection Time: 08/05/20  2:05 PM   Specimen: Nasopharyngeal Swab; Nasopharyngeal(NP) swabs in vial transport medium  Result Value Ref Range   SARS Coronavirus 2 by RT PCR NEGATIVE NEGATIVE   Influenza A by PCR NEGATIVE NEGATIVE   Influenza B by PCR NEGATIVE NEGATIVE  Urinalysis, Complete w Microscopic Urine, Catheterized     Status: Abnormal   Collection Time: 08/05/20  3:16 PM  Result Value Ref Range   Color, Urine YELLOW (A) YELLOW   APPearance CLOUDY (A) CLEAR   Specific Gravity, Urine 1.018 1.005 - 1.030   pH 5.0 5.0 - 8.0   Glucose, UA NEGATIVE NEGATIVE mg/dL   Hgb urine dipstick NEGATIVE NEGATIVE   Bilirubin Urine NEGATIVE NEGATIVE   Ketones, ur NEGATIVE NEGATIVE mg/dL   Protein, ur 100 (A) NEGATIVE mg/dL   Nitrite NEGATIVE NEGATIVE   Leukocytes,Ua LARGE (A) NEGATIVE   RBC / HPF 0-5 0 - 5 RBC/hpf   WBC, UA >50 (H) 0 - 5 WBC/hpf   Bacteria, UA FEW (A) NONE SEEN   Squamous Epithelial / LPF 0-5 0 - 5   WBC Clumps PRESENT    Mucus PRESENT   Blood gas, venous     Status: Abnormal   Collection Time: 08/05/20  3:28 PM  Result Value Ref Range   pH, Ven 7.38 7.250 - 7.430   pCO2, Ven 42 (L) 44.0 - 60.0 mmHg   pO2, Ven 42.0 32.0 - 45.0 mmHg   Bicarbonate 24.8 20.0 - 28.0 mmol/L   Acid-base deficit 0.4 0.0 - 2.0 mmol/L   O2 Saturation 76.3 %   Patient temperature 37.0    Collection site VEIN  Sample type VENOUS   Lactic acid, plasma     Status: Abnormal    Collection Time: 08/05/20  4:10 PM  Result Value Ref Range   Lactic Acid, Venous 2.4 (HH) 0.5 - 1.9 mmol/L   ____________________________________________  EKG My review and personal interpretation at Time: 14:00   Indication: ams  Rate: 100  Rhythm: sinus Axis: normal Other: nonspecific st and t wave abn, no stemi ____________________________________________  RADIOLOGY  I personally reviewed all radiographic images ordered to evaluate for the above acute complaints and reviewed radiology reports and findings.  These findings were personally discussed with the patient.  Please see medical record for radiology report.  ____________________________________________   PROCEDURES  Procedure(s) performed:  .Critical Care  Date/Time: 08/05/2020 4:49 PM Performed by: Merlyn Lot, MD Authorized by: Merlyn Lot, MD   Critical care provider statement:    Critical care time (minutes):  35   Critical care time was exclusive of:  Separately billable procedures and treating other patients   Critical care was necessary to treat or prevent imminent or life-threatening deterioration of the following conditions:  Respiratory failure   Critical care was time spent personally by me on the following activities:  Development of treatment plan with patient or surrogate, discussions with consultants, evaluation of patient's response to treatment, examination of patient, obtaining history from patient or surrogate, ordering and performing treatments and interventions, ordering and review of laboratory studies, ordering and review of radiographic studies, pulse oximetry, re-evaluation of patient's condition and review of old charts    Critical Care performed: yes ____________________________________________   INITIAL IMPRESSION / [place] / ED COURSE  Pertinent labs & imaging results that were available during my care of the patient were reviewed by me and considered in my medical  decision making (see chart for details).   DDX: Dehydration, sepsis, pna, uti, hypoglycemia, cva, drug effect, withdrawal, encephalitis   Alexandria Roman is a 87 y.o. who presents to the ED with presentation as described above.  Patient is critically ill-appearing with acute respiratory failure with hypoxia requiring supplemental oxygen 3 L nasal cannula.  Does appear encephalopathic and given recent falls will order CT imaging.  Her abdominal exam is soft benign.  Will check VBG as well as blood work due to concern for pneumonia or sepsis.  Will provide IV hydration.  Clinical Course as of 08/05/20 1649  Tue Aug 05, 2020  1447 Chest x-ray by my review does show interval development of diffuse infiltrates concerning for pneumonia.  Broad-spectrum antibiotics ordered.  Will give IV fluids due to concern for sepsis. [PR]  1629 COVID test negative.  No evidence of significant hypercapnia.  Patient protecting her airway and satting mid 90s on 3 L nasal cannula.  Do feel that she is stable and appropriate for admission to the hospital at this point.  Has received broad-spectrum antibiotics due to concern for pneumonia.  Possible UTI.  Have discussed with the patient and available family all diagnostics and treatments performed thus far and all questions were answered to the best of my ability. The patient demonstrates understanding and agreement with plan.  [PR]    Clinical Course User Index [PR] Merlyn Lot, MD    The patient was evaluated in Emergency Department today for the symptoms described in the history of present illness. He/she was evaluated in the context of the global COVID-19 pandemic, which necessitated consideration that the patient might be at risk for infection with the SARS-CoV-2 virus that causes COVID-19.  Institutional protocols and algorithms that pertain to the evaluation of patients at risk for COVID-19 are in a state of rapid change based on information released by  regulatory bodies including the CDC and federal and state organizations. These policies and algorithms were followed during the patient's care in the ED.  As part of my medical decision making, I reviewed the following data within the [facility] notes reviewed and incorporated, Labs reviewed, notes from prior ED visits and [place] Controlled Substance Database   ____________________________________________   FINAL CLINICAL IMPRESSION(S) / ED DIAGNOSES  Final diagnoses:  Sepsis with encephalopathy without septic shock, due to unspecified organism ([place])  Altered mental status, unspecified altered mental status type  Closed fracture of multiple ribs of left side, initial encounter  Acute respiratory failure with hypoxia ([place])      NEW MEDICATIONS STARTED DURING THIS VISIT:  New Prescriptions   No medications on file     Note:  This document was prepared using Dragon voice recognition software and may include unintentional dictation errors.    Merlyn Lot, MD 08/05/20 [phone]

## 2020-08-05 NOTE — ED Notes (Signed)
Caretaker at bedside. 

## 2020-08-05 NOTE — ED Notes (Signed)
Home Health nurse remains at bedside

## 2020-08-05 NOTE — ED Notes (Signed)
Resting at present   

## 2020-08-05 NOTE — H&P (Signed)
History and Physical    [name] PPJ:[id number] DOB: [birth year] DOA: 08/05/2020  Referring MD/NP/PA:   PCP: Adin Hector, MD   Patient coming from:  The patient is coming from home.      Chief Complaint: Fall, cough, chest wall pain, confusion  HPI: Alexandria Roman is a 87 y.o. female with medical history significant of hypertension, hyperlipidemia, hypothyroidism, depression with anxiety, chronic back pain, CKD stage III, UTI, who presents with fall, cough, chest wall pain, confusion.  Patient was recently hospitalized from 5/8-5/12 due to sepsis secondary to UTI.  Blood culture was positive for E. coli. Per caregiver at the bedside, pt fell last Thursday, but refused to come to hospital at that time.  Patient developed left-sided chest wall pain.  Patient was seen in urgent care yesterday, and had chest x-ray which showed rib fracture.  Patient developed lethargy and confusion.  When I saw patient in ED, she falls asleep quickly, but is arousable.  When pt is aroused, she is oriented x3.  She could answer most questions appropriately.  Patient has nausea, no vomiting, diarrhea or abdominal pain.  Per caregiver, recently patient has cough sometimes when she eats foods.  She is not using oxygen normally.  She was found to have oxygen desaturation to 85% on room air in ED.  No symptoms of UTI.  Patient moves all extremities normally.  ED Course: pt was found to have WBC 17.1, lactic acid of 2.7, 2.4, INR 1.1, PTT 24, positive urinalysis (cloudy appearance, large amount of leukocyte, few bacteria, WBC> 50), slightly worsening renal function, temperature normal, softer blood pressure 95/55, heart rate of 125, RR 22.  CT of head is negative for acute intracranial abnormalities CT of C-spine negative for bony fracture.  Chest x-ray showed cardiomegaly, vascular congestion, trace left pleural effusion with hazy opacity at the bases.  CT of chest showed rib fracture.  VBG with pH 7.38, CO2 42,  O2 42.  Patient is admitted to progressive bed as inpatient.   CT-chest: 1. Trace left pleural effusion. Partial atelectasis in the lower lobes. No pneumothorax. 2. Acute appearing mildly displaced left sixth and seventh posterior rib fractures. Multiple subacute to chronic right rib fractures. 3. Large hiatal hernia 4. Moderate compression deformity T1 with moderate severe compression deformity of T8. T1 deformity grossly unchanged as compared with 04/19/2020, slight progression in loss of height diffusely of T8 vertebral body since 04/19/2020  Review of Systems:   General: no fevers, chills, no body weight gain, has fatigue HEENT: no blurry vision, hearing changes or sore throat Respiratory: no dyspnea, has coughing,  no wheezing CV: has chest wall pain, no palpitations GI: no nausea, vomiting, abdominal pain, diarrhea, constipation GU: no dysuria, burning on urination, increased urinary frequency, hematuria  Ext: no leg edema Neuro: no unilateral weakness, numbness, or tingling, no vision change or hearing loss. Has fall and confusion. Skin: no rash, no skin tear. MSK: No muscle spasm, no deformity, no limitation of range of movement in spin Heme: No easy bruising.  Travel history: No recent long distant travel.  Allergy:  Allergies  Allergen Reactions   Amoxicillin-Pot Clavulanate Diarrhea   Cephalexin Other (See Comments) and Nausea And Vomiting   Hydrochlorothiazide Other (See Comments) and Nausea And Vomiting   Hydrocodone Bit-Homatrop Mbr Other (See Comments)   Hydrocodone-Chlorpheniramine Other (See Comments)   Metronidazole Other (See Comments)    Other reaction(s): Other (See Comments) Other Reaction: Not Assessed Other Reaction: Not Assessed  Moxifloxacin Other (See Comments)   Prednisone Nausea Only   Simvastatin Other (See Comments)   Sulfa Antibiotics Other (See Comments)    Other reaction(s): Unknown   Sulfasalazine Other (See Comments)    Other  reaction(s): Unknown   Tramadol Other (See Comments)   Celecoxib Nausea And Vomiting   Codeine Nausea Only    Other reaction(s): Other (See Comments) Other reaction(s): Unknown   Cyclobenzaprine Other (See Comments) and Nausea Only    Other reaction(s): Other (See Comments)   Oxycodone-Acetaminophen Nausea Only and Nausea And Vomiting   Penicillin V Potassium Rash    Other reaction(s): Other (See Comments) Other reaction(s): Unknown    Past Medical History:  Diagnosis Date   Cancer ([place])    Hypertension     Past Surgical History:  Procedure Laterality Date   ABDOMINAL HYSTERECTOMY     HIP ARTHROPLASTY Right 05/20/2018   Procedure: ARTHROPLASTY BIPOLAR HIP (HEMIARTHROPLASTY);  Surgeon: Thornton Park, MD;  Location: [facility] ORS;  Service: Orthopedics;  Laterality: Right;    Social History:  reports that she has never smoked. She has never used smokeless tobacco. She reports that she does not drink alcohol and does not use drugs.  Family History:  Family History  Problem Relation Age of Onset   Testicular cancer Father      Prior to Admission medications   Medication Sig Start Date End Date Taking? Authorizing Provider  ALLERGY RELIEF 10 MG tablet Take 10 mg by mouth daily. 05/21/20   [provider]  amLODipine (NORVASC) 2.5 MG tablet Take 1 tablet (2.5 mg total) by mouth daily. 04/24/20   Sharen Hones, MD  calcitonin, salmon, (MIACALCIN/FORTICAL) 200 UNIT/ACT nasal spray Place 1 spray into alternate nostrils daily. 04/23/20   Sharen Hones, MD  Cholecalciferol (VITAMIN D3) 1.25 MG (50000 UT) CAPS Take 1 capsule by mouth once a week. 05/23/20   [provider]  COMBIVENT RESPIMAT 20-100 MCG/ACT AERS respimat Inhale 2 puffs into the lungs in the morning, at noon, and at bedtime. 05/03/19   [provider]  docusate sodium (COLACE) 100 MG capsule Take 100 mg by mouth as needed for constipation.    [provider]  DULoxetine (CYMBALTA) 30 MG capsule  Take 1 capsule by mouth 2 (two) times daily. 11/03/16   [provider]  gabapentin (NEURONTIN) 400 MG capsule Take 800 mg by mouth 2 (two) times daily as needed for pain. 03/27/20   [provider]  latanoprost (XALATAN) 0.005 % ophthalmic solution Place 1 drop into both eyes at bedtime. 02/12/20   [provider]  levothyroxine (SYNTHROID) 75 MCG tablet Take 75 mcg by mouth daily. 06/12/20   [provider]  metaxalone (SKELAXIN) 800 MG tablet Take 400 mg by mouth 3 (three) times daily. 04/16/20   [provider]  Multiple Vitamins-Minerals (PRESERVISION/LUTEIN) CAPS Take 1 capsule by mouth 2 (two) times daily.    [provider]  oxyCODONE (OXY IR/ROXICODONE) 5 MG immediate release tablet Take 0.5 tablets (2.5 mg total) by mouth every 4 (four) hours as needed for moderate pain or severe pain. 04/23/20   Sharen Hones, MD  polyethylene glycol (MIRALAX / GLYCOLAX) 17 g packet Take 17 g by mouth daily. 04/24/20   Sharen Hones, MD  timolol (TIMOPTIC) 0.5 % ophthalmic solution Place 1 drop into both eyes daily. 12/07/19   [provider]  enoxaparin (LOVENOX) 40 MG/0.4ML injection Inject 0.4 mLs (40 mg total) into the skin daily for 28 days. 05/24/18 05/13/19  Bridgett Larsson,  Sheppard Evens, MD  losartan (COZAAR) 25 MG tablet Take 1 tablet (25 mg total) by mouth daily. 05/23/18 05/13/19  Demetrios Loll, MD  metoprolol succinate (TOPROL-XL) 25 MG 24 hr tablet Take 1 tablet by mouth daily. 03/21/19 01/28/20  [provider]    Physical Exam: Vitals:   08/05/20 1602 08/05/20 1630 08/05/20 1707 08/05/20 1821  BP: (!) 99/48 120/60 103/60 (!) 129/51  Pulse: 95 98 96 87  Resp: (!) 21 18 (!) 25 (!) 23  Temp:      TempSrc:      SpO2: 95% 96% 96% 98%  Weight:      Height:       General: Not in acute distress HEENT:       Eyes: PERRL, EOMI, no scleral icterus.       ENT: No discharge from the ears and nose, no pharynx injection, no tonsillar enlargement.        Neck:  No JVD, no bruit, no mass felt. Heme: No neck lymph node enlargement. Cardiac: S1/S2, RRR, No murmurs, No gallops or rubs. Respiratory: Has coarse breathing sound bilaterally GI: Soft, nondistended, nontender, no rebound pain, no organomegaly, BS present. GU: No hematuria Ext: No pitting leg edema bilaterally. 1+DP/PT pulse bilaterally. Musculoskeletal: No joint deformities, No joint redness or warmth, no limitation of ROM in spin. Skin: No rashes.  Neuro: Lethargic, mild confusion, easily falling asleep, but arousable, oriented X3, cranial nerves II-XII grossly intact, moves all extremities normally.  Psych: Patient is not psychotic, no suicidal or hemocidal ideation.  Labs on Admission: I have personally reviewed following labs and imaging studies  CBC: Recent Labs  Lab 08/05/20 1350  WBC 17.1*  HGB 12.2  HCT 36.9  MCV 87.9  PLT 161   Basic Metabolic Panel: Recent Labs  Lab 08/05/20 1350  NA 132*  K 3.6  CL 102  CO2 22  GLUCOSE 167*  BUN 26*  CREATININE 1.26*  CALCIUM 8.6*   GFR: Estimated Creatinine Clearance: 29.3 mL/min (A) (by C-G formula based on SCr of 1.26 mg/dL (H)). Liver Function Tests: Recent Labs  Lab 08/05/20 1350  AST 23  ALT 10  ALKPHOS 69  BILITOT 0.8  PROT 6.3*  ALBUMIN 3.4*   No results for input(s): LIPASE, AMYLASE in the last 168 hours. No results for input(s): AMMONIA in the last 168 hours. Coagulation Profile: Recent Labs  Lab 08/05/20 1350  INR 1.1   Cardiac Enzymes: No results for input(s): CKTOTAL, CKMB, CKMBINDEX, TROPONINI in the last 168 hours. BNP (last 3 results) No results for input(s): PROBNP in the last 8760 hours. HbA1C: No results for input(s): HGBA1C in the last 72 hours. CBG: No results for input(s): GLUCAP in the last 168 hours. Lipid Profile: No results for input(s): CHOL, HDL, LDLCALC, TRIG, CHOLHDL, LDLDIRECT in the last 72 hours. Thyroid Function Tests: No results for input(s): TSH, T4TOTAL, FREET4,  T3FREE, THYROIDAB in the last 72 hours. Anemia Panel: No results for input(s): VITAMINB12, FOLATE, FERRITIN, TIBC, IRON, RETICCTPCT in the last 72 hours. Urine analysis:    Component Value Date/Time   COLORURINE YELLOW (A) 08/05/2020 1516   APPEARANCEUR CLOUDY (A) 08/05/2020 1516   LABSPEC 1.018 08/05/2020 1516   PHURINE 5.0 08/05/2020 1516   GLUCOSEU NEGATIVE 08/05/2020 1516   HGBUR NEGATIVE 08/05/2020 1516   BILIRUBINUR NEGATIVE 08/05/2020 1516   KETONESUR NEGATIVE 08/05/2020 1516   PROTEINUR 100 (A) 08/05/2020 1516   NITRITE NEGATIVE 08/05/2020 1516   LEUKOCYTESUR LARGE (A) 08/05/2020 1516   Sepsis  Labs: @LABRCNTIP (procalcitonin:4,lacticidven:4) ) Recent Results (from the past 240 hour(s))  Resp Panel by RT-PCR (Flu A&B, Covid) Nasopharyngeal Swab     Status: None   Collection Time: 08/05/20  2:05 PM   Specimen: Nasopharyngeal Swab; Nasopharyngeal(NP) swabs in vial transport medium  Result Value Ref Range Status   SARS Coronavirus 2 by RT PCR NEGATIVE NEGATIVE Final    Comment: (NOTE) SARS-CoV-2 target nucleic acids are NOT DETECTED.  The SARS-CoV-2 RNA is generally detectable in upper respiratory specimens during the acute phase of infection. The lowest concentration of SARS-CoV-2 viral copies this assay can detect is 138 copies/mL. A negative result does not preclude SARS-Cov-2 infection and should not be used as the sole basis for treatment or other patient management decisions. A negative result may occur with  improper specimen collection/handling, submission of specimen other than nasopharyngeal swab, presence of viral mutation(s) within the areas targeted by this assay, and inadequate number of viral copies(<138 copies/mL). A negative result must be combined with clinical observations, patient history, and epidemiological information. The expected result is Negative.  Fact Sheet for Patients:  EntrepreneurPulse.com.au  Fact Sheet for Healthcare  Providers:  IncredibleEmployment.be  This test is no t yet approved or cleared by the [place] FDA and  has been authorized for detection and/or diagnosis of SARS-CoV-2 by FDA under an Emergency Use Authorization (EUA). This EUA will remain  in effect (meaning this test can be used) for the duration of the COVID-19 declaration under Section 564(b)(1) of the Act, 21 U.S.C.section 360bbb-3(b)(1), unless the authorization is terminated  or revoked sooner.       Influenza A by PCR NEGATIVE NEGATIVE Final   Influenza B by PCR NEGATIVE NEGATIVE Final    Comment: (NOTE) The Xpert Xpress SARS-CoV-2/FLU/RSV plus assay is intended as an aid in the diagnosis of influenza from Nasopharyngeal swab specimens and should not be used as a sole basis for treatment. Nasal washings and aspirates are unacceptable for Xpert Xpress SARS-CoV-2/FLU/RSV testing.  Fact Sheet for Patients: EntrepreneurPulse.com.au  Fact Sheet for Healthcare Providers: IncredibleEmployment.be  This test is not yet approved or cleared by the [place] FDA and has been authorized for detection and/or diagnosis of SARS-CoV-2 by FDA under an Emergency Use Authorization (EUA). This EUA will remain in effect (meaning this test can be used) for the duration of the COVID-19 declaration under Section 564(b)(1) of the Act, 21 U.S.C. section 360bbb-3(b)(1), unless the authorization is terminated or revoked.  Performed at [facility], [address], [place] [address]      Radiological Exams on Admission: DG Ribs Unilateral W/Chest Left  Result Date: 08/04/2020 CLINICAL DATA:  [place] 3 days ago EXAM: LEFT RIBS AND CHEST - 3+ VIEW COMPARISON:  04/19/2020 FINDINGS: Single-view chest demonstrates no focal opacity or pneumothorax. Small left pleural effusion. Large hiatal hernia. Left rib series slightly limited by linear artifact. Possible left fifth  anterolateral rib fracture. IMPRESSION: Possible left fifth anterolateral rib fracture. Negative for pneumothorax. Possible small left effusion. Large hiatal hernia Electronically Signed   By: Donavan Foil M.D.   On: 08/04/2020 15:54   CT Head Wo Contrast  Result Date: 08/05/2020 CLINICAL DATA:  Fall last Thursday lethargic rib fracture EXAM: CT HEAD WITHOUT CONTRAST CT CERVICAL SPINE WITHOUT CONTRAST TECHNIQUE: Multidetector CT imaging of the head and cervical spine was performed following the standard protocol without intravenous contrast. Multiplanar CT image reconstructions of the cervical spine were also generated. COMPARISON:  CT brain 04/19/2020, MRI 04/17/2020, CT 04/19/2020  FINDINGS: CT HEAD FINDINGS Brain: No acute territorial infarction, hemorrhage or intracranial mass. Mild atrophy. Moderate white matter hypodensity consistent with chronic small vessel ischemic change. Probable chronic lacunar infarct in the right basal ganglia. Stable ventricle size. Vascular: No hyperdense vessels.  Carotid vascular calcification Skull: Normal. Negative for fracture or focal lesion. Sinuses/Orbits: No acute finding. Other: None. CT CERVICAL SPINE FINDINGS Alignment: No subluxation.  Facet alignment is maintained Skull base and vertebrae: Craniovertebral junction is intact. Cervical vertebral bodies demonstrate normal stature. Moderate compression fracture T1 involves both the superior and inferior endplates with approximately 40% loss of vertebral body height anteriorly. Fracture involves the left lamina and extends to the inferior facet as before. Mild retropulsion of inferior vertebral body. Soft tissues and spinal canal: No prevertebral fluid or swelling. No visible canal hematoma. Disc levels: Moderate degenerative osteophytes with relatively patent disc spaces. Hypertrophic facet degenerative changes at multiple levels. Upper chest: Negative. Other: None IMPRESSION: 1. No CT evidence for acute intracranial  abnormality. Atrophy and chronic small vessel ischemic change of the white matter 2. No acute osseous abnormality of the cervical spine. Moderate compression deformity of T1 vertebral, grossly unchanged as compared with CT from March 2022. Electronically Signed   By: Donavan Foil M.D.   On: 08/05/2020 15:28   CT CHEST WO CONTRAST  Result Date: 08/05/2020 CLINICAL DATA:  Fall history of rib fracture EXAM: CT CHEST WITHOUT CONTRAST TECHNIQUE: Multidetector CT imaging of the chest was performed following the standard protocol without IV contrast. COMPARISON:  Radiographs 08/04/2020, CT 04/19/2020 FINDINGS: Cardiovascular: Limited evaluation without intravenous contrast. Moderate aortic atherosclerosis. No aneurysm. Coronary vascular calcification. Borderline to mild cardiomegaly. No pericardial effusion. Mediastinum/Nodes: Midline trachea. No suspicious thyroid mass. Borderline prevascular lymph node measuring 9 mm. Right precarinal lymph node measuring 10 mm. Large hiatal hernia. Lungs/Pleura: Trace left effusion. No pneumothorax. Partial atelectasis at the lower lobes. Scattered hazy pulmonary densities which may be due to atelectasis or small airways disease. Upper Abdomen: No acute abnormality. Musculoskeletal: Sternum is intact. Moderate compression deformity of T1. Moderate severe compression deformity of T8 with slight progressed loss of height diffusely of the vertebra. Minimal retropulsion inferior aspect of vertebral body. Subacute to chronic right fourth, 6, and seventh anterior to anterolateral rib fractures. Subacute to chronic right eighth posterior rib fracture. Acute appearing mildly displaced left sixth and seventh posterior rib fractures. IMPRESSION: 1. Trace left pleural effusion. Partial atelectasis in the lower lobes. No pneumothorax. 2. Acute appearing mildly displaced left sixth and seventh posterior rib fractures. Multiple subacute to chronic right rib fractures. 3. Large hiatal hernia 4.  Moderate compression deformity T1 with moderate severe compression deformity of T8. T1 deformity grossly unchanged as compared with 04/19/2020, slight progression in loss of height diffusely of T8 vertebral body since 04/19/2020 Aortic Atherosclerosis (ICD10-I70.0). Electronically Signed   By: Donavan Foil M.D.   On: 08/05/2020 15:39   CT Cervical Spine Wo Contrast  Result Date: 08/05/2020 CLINICAL DATA:  Fall last Thursday lethargic rib fracture EXAM: CT HEAD WITHOUT CONTRAST CT CERVICAL SPINE WITHOUT CONTRAST TECHNIQUE: Multidetector CT imaging of the head and cervical spine was performed following the standard protocol without intravenous contrast. Multiplanar CT image reconstructions of the cervical spine were also generated. COMPARISON:  CT brain 04/19/2020, MRI 04/17/2020, CT 04/19/2020 FINDINGS: CT HEAD FINDINGS Brain: No acute territorial infarction, hemorrhage or intracranial mass. Mild atrophy. Moderate white matter hypodensity consistent with chronic small vessel ischemic change. Probable chronic lacunar infarct in the right basal ganglia. Stable ventricle  size. Vascular: No hyperdense vessels.  Carotid vascular calcification Skull: Normal. Negative for fracture or focal lesion. Sinuses/Orbits: No acute finding. Other: None. CT CERVICAL SPINE FINDINGS Alignment: No subluxation.  Facet alignment is maintained Skull base and vertebrae: Craniovertebral junction is intact. Cervical vertebral bodies demonstrate normal stature. Moderate compression fracture T1 involves both the superior and inferior endplates with approximately 40% loss of vertebral body height anteriorly. Fracture involves the left lamina and extends to the inferior facet as before. Mild retropulsion of inferior vertebral body. Soft tissues and spinal canal: No prevertebral fluid or swelling. No visible canal hematoma. Disc levels: Moderate degenerative osteophytes with relatively patent disc spaces. Hypertrophic facet degenerative  changes at multiple levels. Upper chest: Negative. Other: None IMPRESSION: 1. No CT evidence for acute intracranial abnormality. Atrophy and chronic small vessel ischemic change of the white matter 2. No acute osseous abnormality of the cervical spine. Moderate compression deformity of T1 vertebral, grossly unchanged as compared with CT from March 2022. Electronically Signed   By: Donavan Foil M.D.   On: 08/05/2020 15:28   DG Chest Portable 1 View  Result Date: 08/05/2020 CLINICAL DATA:  Altered mental status EXAM: PORTABLE CHEST 1 VIEW COMPARISON:  08/04/2020 FINDINGS: Enlarged cardiomediastinal silhouette with trace left effusion and hazy atelectasis at the bases. Enlarged mediastinum likely due to portable technique and rotation. Mild vascular congestion. No pneumothorax. Large hiatal hernia IMPRESSION: 1. Enlarged cardiomediastinal silhouette with slight vascular congestion 2. Trace left effusion with hazy atelectasis at the bases 3. Large hiatal hernia Electronically Signed   By: Donavan Foil M.D.   On: 08/05/2020 15:40     EKG: I have personally reviewed.  Sinus rhythm, QTC 450, T wave inversion in V1-V3, lead I/aVL, early R wave progression  Assessment/Plan Principal Problem:   Acute respiratory failure with hypoxia (HCC) Active Problems:   Essential hypertension   Hypothyroidism, unspecified   Depression with anxiety   Severe sepsis (HCC)   Chronic thoracic back pain   UTI (urinary tract infection)   CKD (chronic kidney disease), stage IIIa   Left rib fracture   Acute metabolic encephalopathy   Fall   Abnormal EKG   HCAP (healthcare-associated pneumonia)   Acute respiratory failure with hypoxia due to possible HCAP: Patient is not using oxygen at home, but has oxygen desaturated to 85% on room air.  Chest x-ray showed hazy opacity at the bases, indicating possible pneumonia.   -Admitted to progressive unit as inpatient -Please to progressive unit for observation - IV  Vancomycin and cefepime (patient received 1 dose of Rocephin and azithromycin in ED) - Mucinex for cough  - Bronchodilators - Urine legionella and S. pneumococcal antigen - Follow up blood culture x2, sputum culture  - will get SLP  UTI (urinary tract infection): -pt is on vancomycin and cefepime -Follow-up of blood culture and urine culture  Severe sepsis due to UTI and possible HCAP: Patient meets criteria for severe sepsis with leukocytosis with WBC 17.1, tachycardia with heart rate up to 125, tachypnea with RR 22, lactic acid is elevated at 2.7, 2.4. - will get Procalcitonin and trend lactic acid level per sepsis protocol - IVF: 2L of LR bolus in ED, followed by 100 mL per hour of NS  Essential hypertension: Patient not taking blood pressure medications currently -IV hydralazine as needed  Hypothyroidism, unspecified -Synthroid  Depression with anxiety -Continue home Cymbalta, Ativan  Chronic thoracic back pain -As needed Tylenol and Percocet  CKD (chronic kidney disease), stage IIIa: Slightly worsening.  Recent baseline creatinine 0.90-1.17.  Her creatinine is 1.26, BUN 26 -Follow-up with BMP  Left rib fracture -Incentive spirometry -As needed oxycodone and Tylenol  Acute metabolic encephalopathy: Likely multifactorial etiology, including UTI, hypoxia and possible pneumonia.  CT head is negative for acute intracranial abnormalities. -Frequent neurochecks  Fall -PT/OT  Abnormal EKG: EKG showed T wave inversion in V1-V3 and I/aVL -Troponin x3     DVT ppx: sQ Lovenox Code Status: Partial code (since I cannot reach her son or power of attorney, I discussed with patient about CODE STATUS in the presence of her caregiver, she states that it is okay for CPR, but no intubation) Family Communication: Caregiver is at the bedside.  I tried to call her son without success, I left a message to him.  I also tried to call her power of attorney without success. Disposition Plan:   Anticipate discharge back to previous environment Consults called: None Admission status and Level of care: Progressive Cardiac:    as inpt       Status is: Inpatient  Remains inpatient appropriate because:Inpatient level of care appropriate due to severity of illness  Dispo: The patient is from: Home              Anticipated d/c is to: Home              Patient currently is not medically stable to d/c.   Difficult to place patient No         Date of Service 08/05/2020    [facility]   If 7PM-7AM, please contact night-coverage [email or web address] 08/05/2020, 6:57 PM

## 2020-08-05 NOTE — ED Notes (Signed)
Admit: PNA, sepsis

## 2020-08-05 NOTE — ED Notes (Signed)
EMS has been called for transport to local hospital.

## 2020-08-05 NOTE — ED Provider Notes (Signed)
Alexandria Roman    CSN: [id number] Arrival date & time: 08/05/20  1236      History   Chief Complaint Chief Complaint  Patient presents with   Altered Mental Status     HPI Alexandria Roman is a 87 y.o. female.  Accompanied by her caregivers, patient presents with chills and weakness this morning.  Caregivers report patient is not as alert as usual in the last couple of hours; sleeping more.  No fever, shortness of breath, abdominal pain, dysuria.  She was seen here yesterday for rib pain after falling last week; diagnosed with left 5th rib fracture.  The history is provided by a caregiver, the patient and medical records.   Past Medical History:  Diagnosis Date   Cancer [facility])    Hypertension     Patient Active Problem List   Diagnosis Date Noted   Severe sepsis ([place])    Cystitis    E. coli UTI    Chronic thoracic back pain    Sepsis due to gram-negative UTI ([place]) 06/22/2020   Nocturnal hypoxia    Weakness    Neuropathy    Glaucoma of both eyes    Thoracic compression fracture ([place]) 04/19/2020   Hip fracture ([place]) 05/19/2018   Depression with anxiety 12/09/2016   Numbness of foot 12/09/2016   Macular degeneration 12/09/2016   Gait instability 12/08/2016   Upper respiratory infection, viral 12/06/2016   Primary osteoarthritis of both knees 07/20/2016   Postmenopausal osteoporosis 04/15/2016   Chest pain 03/19/2016   Spinal stenosis 08/04/2015   Myalgia 06/11/2015   Polyarthralgia 06/11/2015   Chronic midline low back pain with bilateral sciatica 01/10/2015   Essential hypertension 06/04/2014   CKD (chronic kidney disease) stage 3, GFR 30-59 ml/min (HCC) 06/04/2014   Mixed hyperlipidemia 06/04/2014   Other allergic rhinitis 06/04/2014   DDD (degenerative disc disease), lumbar 09/07/2013   Lumbar radiculitis 09/07/2013   Hypothyroidism, unspecified 08/02/2013    Past Surgical History:  Procedure Laterality Date   ABDOMINAL HYSTERECTOMY     HIP  ARTHROPLASTY Right 05/20/2018   Procedure: ARTHROPLASTY BIPOLAR HIP (HEMIARTHROPLASTY);  Surgeon: Thornton Park, MD;  Location: [facility] ORS;  Service: Orthopedics;  Laterality: Right;    OB History   No obstetric history on file.      Home Medications    Prior to Admission medications   Medication Sig Start Date End Date Taking? Authorizing Provider  ALLERGY RELIEF 10 MG tablet Take 10 mg by mouth daily. 05/21/20   [provider]  amLODipine (NORVASC) 2.5 MG tablet Take 1 tablet (2.5 mg total) by mouth daily. 04/24/20   Sharen Hones, MD  calcitonin, salmon, (MIACALCIN/FORTICAL) 200 UNIT/ACT nasal spray Place 1 spray into alternate nostrils daily. 04/23/20   Sharen Hones, MD  Cholecalciferol (VITAMIN D3) 1.25 MG (50000 UT) CAPS Take 1 capsule by mouth once a week. 05/23/20   [provider]  COMBIVENT RESPIMAT 20-100 MCG/ACT AERS respimat Inhale 2 puffs into the lungs in the morning, at noon, and at bedtime. 05/03/19   [provider]  docusate sodium (COLACE) 100 MG capsule Take 100 mg by mouth as needed for constipation.    [provider]  DULoxetine (CYMBALTA) 30 MG capsule Take 1 capsule by mouth 2 (two) times daily. 11/03/16   [provider]  gabapentin (NEURONTIN) 400 MG capsule Take 800 mg by mouth 2 (two) times daily as needed for pain. 03/27/20   [provider]  latanoprost (XALATAN) 0.005 % ophthalmic solution  Place 1 drop into both eyes at bedtime. 02/12/20   [provider]  levothyroxine (SYNTHROID) 75 MCG tablet Take 75 mcg by mouth daily. 06/12/20   [provider]  metaxalone (SKELAXIN) 800 MG tablet Take 400 mg by mouth 3 (three) times daily. 04/16/20   [provider]  Multiple Vitamins-Minerals (PRESERVISION/LUTEIN) CAPS Take 1 capsule by mouth 2 (two) times daily.    [provider]  oxyCODONE (OXY IR/ROXICODONE) 5 MG immediate release tablet Take 0.5 tablets (2.5 mg total) by mouth every 4  (four) hours as needed for moderate pain or severe pain. 04/23/20   Sharen Hones, MD  polyethylene glycol (MIRALAX / GLYCOLAX) 17 g packet Take 17 g by mouth daily. 04/24/20   Sharen Hones, MD  timolol (TIMOPTIC) 0.5 % ophthalmic solution Place 1 drop into both eyes daily. 12/07/19   [provider]  enoxaparin (LOVENOX) 40 MG/0.4ML injection Inject 0.4 mLs (40 mg total) into the skin daily for 28 days. 05/24/18 05/13/19  Demetrios Loll, MD  losartan (COZAAR) 25 MG tablet Take 1 tablet (25 mg total) by mouth daily. 05/23/18 05/13/19  Demetrios Loll, MD  metoprolol succinate (TOPROL-XL) 25 MG 24 hr tablet Take 1 tablet by mouth daily. 03/21/19 01/28/20  [provider]    Family History Family History  Problem Relation Age of Onset   Testicular cancer Father     Social History Social History   Tobacco Use   Smoking status: Never   Smokeless tobacco: Never  Vaping Use   Vaping Use: Never used  Substance Use Topics   Alcohol use: No   Drug use: No     Allergies   Amoxicillin-pot clavulanate, Cephalexin, Hydrochlorothiazide, Hydrocodone bit-homatrop mbr, Hydrocodone-chlorpheniramine, Metronidazole, Moxifloxacin, Prednisone, Simvastatin, Sulfa antibiotics, Sulfasalazine, Tramadol, Celecoxib, Codeine, Cyclobenzaprine, Oxycodone-acetaminophen, and Penicillin v potassium   Review of Systems Review of Systems  Constitutional:  Positive for activity change and chills. Negative for fever.  Respiratory:  Negative for cough and shortness of breath.   Cardiovascular:  Negative for chest pain and palpitations.  Gastrointestinal:  Negative for abdominal pain and vomiting.  Genitourinary:  Negative for dysuria and hematuria.  Skin:  Negative for color change and rash.  Neurological:  Positive for weakness. Negative for seizures and syncope.  All other systems reviewed and are negative.   Physical Exam Triage Vital Signs ED Triage Vitals  Enc Vitals Group     BP      Pulse      Resp       Temp      Temp src      SpO2      Weight      Height      Head Circumference      Peak Flow      Pain Score      Pain Loc      Pain Edu?      Excl. in [place]?    No data found.  Updated Vital Signs BP 105/71 (BP Location: Left Arm)   Pulse (!) 125   Temp 98 F (36.7 C) (Oral)   SpO2 92%   Visual Acuity Right Eye Distance:   Left Eye Distance:   Bilateral Distance:    Right Eye Near:   Left Eye Near:    Bilateral Near:     Physical Exam Vitals and nursing note reviewed.  Constitutional:      General: She is not in acute distress.    Appearance: She is well-developed. She  is ill-appearing.     Comments: Patient will answer questions when roused but is sleeping in between questions.  HENT:     Head: Normocephalic and atraumatic.     Mouth/Throat:     Mouth: Mucous membranes are moist.  Eyes:     Conjunctiva/sclera: Conjunctivae normal.  Cardiovascular:     Rate and Rhythm: Normal rate and regular rhythm.     Heart sounds: Normal heart sounds.  Pulmonary:     Effort: Pulmonary effort is normal. No respiratory distress.     Breath sounds: Normal breath sounds.  Abdominal:     Palpations: Abdomen is soft.     Tenderness: There is no abdominal tenderness.  Musculoskeletal:     Cervical back: Neck supple.  Skin:    General: Skin is warm and dry.  Neurological:     Motor: Weakness present.     Gait: Gait abnormal.     Comments: Generalized weakness.  Markedly more difficulty standing and changing positions since exam yesterday.     UC Treatments / Results  Labs (all labs ordered are listed, but only abnormal results are displayed) Labs Reviewed  POCT URINALYSIS DIP (MANUAL ENTRY)    EKG   Radiology DG Ribs Unilateral W/Chest Left  Result Date: 08/04/2020 CLINICAL DATA:  [place] 3 days ago EXAM: LEFT RIBS AND CHEST - 3+ VIEW COMPARISON:  04/19/2020 FINDINGS: Single-view chest demonstrates no focal opacity or pneumothorax. Small left pleural effusion.  Large hiatal hernia. Left rib series slightly limited by linear artifact. Possible left fifth anterolateral rib fracture. IMPRESSION: Possible left fifth anterolateral rib fracture. Negative for pneumothorax. Possible small left effusion. Large hiatal hernia Electronically Signed   By: Donavan Foil M.D.   On: 08/04/2020 15:54    Procedures Procedures (including critical care time)  Medications Ordered in UC Medications - No data to display  Initial Impression / Assessment and Plan / UC Course  I have reviewed the triage vital signs and the nursing notes.  Pertinent labs & imaging results that were available during my care of the patient were reviewed by me and considered in my medical decision making (see chart for details).  Hypoxia, tachycardia, altered mental status.  O2 sat 83 to 85% on room air; increased to 91-92% on 3 L O2 via nasal cannula.  Heart rate 120s to 130s.  Afebrile.  Patient sleeping between questions; have to tap patient and call her name to rouse her.  This is markedly different than her demeanor yesterday.  Sending patient to the ED via EMS.  Caregivers agree to plan of care.   Final Clinical Impressions(s) / UC Diagnoses   Final diagnoses:  Hypoxia  Tachycardia  Altered mental status, unspecified altered mental status type   Discharge Instructions   None    ED Prescriptions   None    PDMP not reviewed this encounter.   Sharion Balloon, NP 08/05/20 1328

## 2020-08-06 ENCOUNTER — Encounter: Payer: Self-pay | Admitting: Internal Medicine

## 2020-08-06 DIAGNOSIS — A419 Sepsis, unspecified organism: Principal | ICD-10-CM

## 2020-08-06 DIAGNOSIS — W19XXXD Unspecified fall, subsequent encounter: Secondary | ICD-10-CM

## 2020-08-06 DIAGNOSIS — R6520 Severe sepsis without septic shock: Secondary | ICD-10-CM

## 2020-08-06 DIAGNOSIS — S2242XD Multiple fractures of ribs, left side, subsequent encounter for fracture with routine healing: Secondary | ICD-10-CM

## 2020-08-06 LAB — CBC
HCT: 35.1 % — ABNORMAL LOW (ref 36.0–46.0)
Hemoglobin: 11.4 g/dL — ABNORMAL LOW (ref 12.0–15.0)
MCH: 28.9 pg (ref 26.0–34.0)
MCHC: 32.5 g/dL (ref 30.0–36.0)
MCV: 88.9 fL (ref 80.0–100.0)
Platelets: 230 10*3/uL (ref 150–400)
RBC: 3.95 MIL/uL (ref 3.87–5.11)
RDW: 15.7 % — ABNORMAL HIGH (ref 11.5–15.5)
WBC: 13.8 10*3/uL — ABNORMAL HIGH (ref 4.0–10.5)
nRBC: 0 % (ref 0.0–0.2)

## 2020-08-06 LAB — TROPONIN I (HIGH SENSITIVITY): Troponin I (High Sensitivity): 31 ng/L — ABNORMAL HIGH (ref <18)

## 2020-08-06 LAB — BASIC METABOLIC PANEL
Anion gap: 6 (ref 5–15)
BUN: 20 mg/dL (ref 8–23)
CO2: 27 mmol/L (ref 22–32)
Calcium: 8.6 mg/dL — ABNORMAL LOW (ref 8.9–10.3)
Chloride: 102 mmol/L (ref 98–111)
Creatinine, Ser: 0.98 mg/dL (ref 0.44–1.00)
GFR, Estimated: 56 mL/min — ABNORMAL LOW (ref >60)
Glucose, Bld: 117 mg/dL — ABNORMAL HIGH (ref 70–99)
Potassium: 4.5 mmol/L (ref 3.5–5.1)
Sodium: 135 mmol/L (ref 135–145)

## 2020-08-06 MED ORDER — OXYCODONE HCL 5 MG PO TABS
2.5000 mg | ORAL_TABLET | ORAL | Status: DC | PRN
  Administered 2020-08-06 – 2020-08-07 (×3): 2.5 mg via ORAL
  Filled 2020-08-06 (×3): qty 1

## 2020-08-06 MED ORDER — LEVOTHYROXINE SODIUM 50 MCG PO TABS
75.0000 ug | ORAL_TABLET | Freq: Every day | ORAL | Status: DC
  Administered 2020-08-07 – 2020-08-10 (×4): 75 ug via ORAL
  Filled 2020-08-06 (×2): qty 2
  Filled 2020-08-06 (×2): qty 1
  Filled 2020-08-06 (×2): qty 2

## 2020-08-06 MED ORDER — DULOXETINE HCL 30 MG PO CPEP
30.0000 mg | ORAL_CAPSULE | Freq: Two times a day (BID) | ORAL | Status: DC
  Administered 2020-08-06 – 2020-08-10 (×9): 30 mg via ORAL
  Filled 2020-08-06 (×10): qty 1

## 2020-08-06 MED ORDER — SODIUM CHLORIDE 0.9 % IV SOLN
2.0000 g | Freq: Two times a day (BID) | INTRAVENOUS | Status: DC
  Administered 2020-08-06 – 2020-08-08 (×4): 2 g via INTRAVENOUS
  Filled 2020-08-06 (×5): qty 2

## 2020-08-06 MED ORDER — LATANOPROST 0.005 % OP SOLN
1.0000 [drp] | Freq: Every day | OPHTHALMIC | Status: DC
  Administered 2020-08-06 – 2020-08-09 (×4): 1 [drp] via OPHTHALMIC
  Filled 2020-08-06: qty 2.5

## 2020-08-06 MED ORDER — HALOPERIDOL LACTATE 5 MG/ML IJ SOLN
1.0000 mg | Freq: Four times a day (QID) | INTRAMUSCULAR | Status: DC | PRN
  Administered 2020-08-06: 1 mg via INTRAVENOUS
  Filled 2020-08-06: qty 1

## 2020-08-06 MED ORDER — TIMOLOL MALEATE 0.5 % OP SOLN
1.0000 [drp] | Freq: Every day | OPHTHALMIC | Status: DC
  Administered 2020-08-06 – 2020-08-10 (×5): 1 [drp] via OPHTHALMIC
  Filled 2020-08-06 (×2): qty 5

## 2020-08-06 MED ORDER — VANCOMYCIN HCL 750 MG/150ML IV SOLN
750.0000 mg | INTRAVENOUS | Status: DC
  Administered 2020-08-06: 750 mg via INTRAVENOUS
  Filled 2020-08-06 (×2): qty 150

## 2020-08-06 MED ORDER — PRESERVISION/LUTEIN PO CAPS: 1.0000 | ORAL_CAPSULE | Freq: Two times a day (BID) | ORAL | Status: DC

## 2020-08-06 MED ORDER — GABAPENTIN 400 MG PO CAPS
400.0000 mg | ORAL_CAPSULE | Freq: Every day | ORAL | Status: DC
  Administered 2020-08-06 – 2020-08-09 (×4): 400 mg via ORAL
  Filled 2020-08-06 (×4): qty 1

## 2020-08-06 MED ORDER — LORAZEPAM 0.5 MG PO TABS
0.2500 mg | ORAL_TABLET | Freq: Every day | ORAL | Status: DC
  Administered 2020-08-06 – 2020-08-10 (×5): 0.25 mg via ORAL
  Filled 2020-08-06 (×5): qty 1

## 2020-08-06 MED ORDER — POLYETHYLENE GLYCOL 3350 17 G PO PACK
17.0000 g | PACK | Freq: Every day | ORAL | Status: DC | PRN
  Administered 2020-08-06 – 2020-08-10 (×4): 17 g via ORAL
  Filled 2020-08-06 (×4): qty 1

## 2020-08-06 MED ORDER — OCUVITE-LUTEIN PO CAPS
1.0000 | ORAL_CAPSULE | Freq: Two times a day (BID) | ORAL | Status: DC
  Administered 2020-08-06 – 2020-08-10 (×9): 1 via ORAL
  Filled 2020-08-06 (×11): qty 1

## 2020-08-06 NOTE — Evaluation (Signed)
Occupational Therapy Evaluation Patient Details Name: Alexandria Roman MRN: [id number] DOB: 1929-09-17 Today's Date: 08/06/2020    History of Present Illness Alexandria Roman is a 87 y.o. female admitted on 08/05/2020 with pneumonia.  Pharmacy has been consulted for Vancomycin and Cefepime dosing.   Clinical Impression     Patient presenting with decreased I in self care, balance, functional mobility/transfers, endurance. Patient lives in [place] and has paid caregiver from 9am-7pm and then from 9pm- 6am. She is alone for only a few hours each day. Pt uses rollator for functional mobility with caregivers providing supervision - min A for mobility and self care depending on how pt is feeling each day.Caregiver present during this evaluation. Patient currently functioning at min A but limited secondary to significant L rib rain. RN notified.Patient will benefit from acute OT to increase overall independence in the areas of ADLs, functional mobility, and safety awareness in order to safely discharge home with caregivers.   Follow Up Recommendations  Home health OT;Supervision/Assistance - 24 hour    Equipment Recommendations  3 in 1 bedside commode       Precautions / Restrictions Precautions Precautions: Fall Precaution Comments: Rib fxs      Mobility Bed Mobility   Bed Mobility: Supine to Sit;Sit to Supine     Supine to sit: Min assist Sit to supine: Supervision   General bed mobility comments: Pt needing assistance for trunk to EOB and pt laying self back in bed secondary to spasms    Transfers                 General transfer comment: deferred secondary to pain    Balance Overall balance assessment: Needs assistance Sitting-balance support: Feet unsupported;Bilateral upper extremity supported Sitting balance-Leahy Scale: Fair                                     ADL either performed or assessed with clinical judgement   ADL Overall ADL's : Needs  assistance/impaired                                       General ADL Comments: Pt limited secondary to intensive L rib pain     Vision Patient Visual Report: No change from baseline              Pertinent Vitals/Pain Pain Assessment: Faces Faces Pain Scale: Hurts worst Pain Location: L ribs Pain Descriptors / Indicators: Shooting;Sharp;Spasm;Crying;Grimacing Pain Intervention(s): Limited activity within patient's tolerance;Monitored during session;Repositioned;Patient requesting pain meds-RN notified     Hand Dominance Right   Extremity/Trunk Assessment Upper Extremity Assessment Upper Extremity Assessment: Overall WFL for tasks assessed;Generalized weakness   Lower Extremity Assessment Lower Extremity Assessment: Overall WFL for tasks assessed;Generalized weakness       Communication Communication Communication: No difficulties   Cognition Arousal/Alertness: Awake/alert Behavior During Therapy: WFL for tasks assessed/performed Overall Cognitive Status: Impaired/Different from baseline Area of Impairment: Safety/judgement                         Safety/Judgement: Decreased awareness of safety;Decreased awareness of deficits     General Comments: Pt is A & O x4 but caregivers report pt is not quite herself.              Home Living Family/patient  expects to be discharged to:: Other (Comment) (independent living facility) Living Arrangements: Other (Comment);Alone Available Help at Discharge: Personal care attendant;Available PRN/intermittently Type of Home: Independent living facility Home Access: Level entry     Home Layout: One level     Bathroom Shower/Tub: Walk-in shower         Home Equipment: Environmental consultant - 4 wheels;Cane - single point;Grab bars - tub/shower;Grab bars - toilet   Additional Comments: Walk in shower and tub shower (grab bar in both); regular toilet with grab bar      Prior Functioning/Environment Level of  Independence: Needs assistance        Comments: Pts has paid caregiver from 9am-7pm,then from 9pm- 6am. Alone for a few hours each day. Caregivers provide supervison - min guard for ambulation. Min A for self care needs. They get her meals from dining hall and bring them to her. They also clean her home.        OT Problem List: Decreased strength;Impaired balance (sitting and/or standing);Pain;Decreased safety awareness;Decreased activity tolerance;Decreased knowledge of use of DME or AE      OT Treatment/Interventions: Self-care/ADL training;Energy conservation;Balance training;Therapeutic exercise;DME and/or AE instruction;Manual therapy;Therapeutic activities;Patient/family education    OT Goals(Current goals can be found in the care plan section) Acute Rehab OT Goals Patient Stated Goal: to decrease rib fx pain OT Goal Formulation: With patient Time For Goal Achievement: 08/20/20 Potential to Achieve Goals: Fair  OT Frequency: Min 2X/week   Barriers to D/C:    none known at this time          AM-PAC OT "6 Clicks" Daily Activity     Outcome Measure Help from another person eating meals?: None Help from another person taking care of personal grooming?: A Little Help from another person toileting, which includes using toliet, bedpan, or urinal?: A Little Help from another person bathing (including washing, rinsing, drying)?: A Little Help from another person to put on and taking off regular upper body clothing?: A Little Help from another person to put on and taking off regular lower body clothing?: A Little 6 Click Score: 19   End of Session Nurse Communication: Mobility status;Patient requests pain meds  Activity Tolerance: Patient limited by pain Patient left: in bed;with call bell/phone within reach;with family/visitor present  OT Visit Diagnosis: Unsteadiness on feet (R26.81);Repeated falls (R29.6);Muscle weakness (generalized) (M62.81);Pain Pain - Right/Left:  Left Pain - part of body:  (ribs)                Time: 1700-1749 OT Time Calculation (min): 41 min Charges:  OT General Charges $OT Visit: 1 Visit OT Evaluation $OT Eval Moderate Complexity: 1 Mod OT Treatments $Self Care/Home Management : 8-22 mins $Therapeutic Activity: 8-22 mins  Darleen Crocker, MS, OTR/L , CBIS ascom [phone]  08/06/20, 1:00 PM

## 2020-08-06 NOTE — Clinical Social Work Note (Signed)
Patient is active with [facility] for PT, OT, aide, and [place], [place]

## 2020-08-06 NOTE — Evaluation (Signed)
Physical Therapy Evaluation Patient Details Name: Alexandria Roman MRN: [id number] DOB: 1930-01-11 Today's Date: 08/06/2020   History of Present Illness  Alexandria Roman is a 87 y.o. female admitted on 08/05/2020 with pneumonia.  Now complains of cough/confusion. History includes HTN, HLD, depression, and UTI. Reports initial fall over 3 months ago from tripping over dog. Now with + L 5th, 6th, 7th rib fractures. Also reports of other falls.  Clinical Impression  Pt is a pleasant 87 year old female who was admitted for ARF with hypoxia. Pt performs bed mobility with min assist, transfers with cga, and ambulation with cga and RW. Takes increased encouragement to participate with therapy. Pt demonstrates deficits with strength/mobility/cognition. Has excellent support system at ILF with ability to increase assist to 24/7 if needed. Would benefit from skilled PT to address above deficits and promote optimal return to PLOF. Recommend transition to [place] upon discharge from acute hospitalization.     Follow Up Recommendations Home health PT;Supervision/Assistance - 24 hour (return to [facility])    Equipment Recommendations  None recommended by PT    Recommendations for Other Services       Precautions / Restrictions Precautions Precautions: Fall Precaution Comments: Rib fxs Restrictions Weight Bearing Restrictions: No      Mobility  Bed Mobility Overal bed mobility: Needs Assistance Bed Mobility: Rolling;Supine to Sit Rolling: Min assist   Supine to sit: Min assist     General bed mobility comments: instructed in log rolling to assist with pain. Min assist required for bed mobility and transition to EOB.    Transfers Overall transfer level: Needs assistance Equipment used: Rolling walker (2 wheeled) Transfers: Sit to/from Stand Sit to Stand: Min guard         General transfer comment: able to push from seated surface and once standing, upright posture  noted  Ambulation/Gait Ambulation/Gait assistance: Min guard Gait Distance (Feet): 3 Feet Assistive device: Rolling walker (2 wheeled) Gait Pattern/deviations: Step-to pattern     General Gait Details: follows commands well. Uses RW. Slight pain with exertion  Stairs            Wheelchair Mobility    Modified Rankin (Stroke Patients Only)       Balance Overall balance assessment: Needs assistance;History of Falls Sitting-balance support: Feet supported Sitting balance-Leahy Scale: Good     Standing balance support: Bilateral upper extremity supported Standing balance-Leahy Scale: Fair                               Pertinent Vitals/Pain Pain Assessment: Faces Faces Pain Scale: Hurts little more Pain Location: L ribs Pain Descriptors / Indicators: Grimacing Pain Intervention(s): Limited activity within patient's tolerance    Home Living Family/patient expects to be discharged to::  (ILF) Living Arrangements: Other relatives (has caregivers majority of the day) Available Help at Discharge: Available PRN/intermittently;Personal care attendant Type of Home: Independent living facility Home Access: Level entry     Home Layout: One level Home Equipment: Walker - 4 wheels;Cane - single point;Grab bars - tub/shower;Grab bars - toilet Additional Comments: Walk in shower and tub shower (grab bar in both); regular toilet with grab bar    Prior Function Level of Independence: Needs assistance   Gait / Transfers Assistance Needed: short distances with rollator     Comments: Pts has paid caregiver from 9am-7pm,then from 9pm- 6am. Alone for a few hours each day. Caregivers provide supervison - min guard  for ambulation. Min A for self care needs. They get her meals from dining hall and bring them to her. They also clean her home.     Hand Dominance        Extremity/Trunk Assessment   Upper Extremity Assessment Upper Extremity Assessment: Generalized  weakness (B UE grossly 4/5; pain limited)    Lower Extremity Assessment Lower Extremity Assessment: Generalized weakness (B LE grossly 4+/5)       Communication   Communication: No difficulties  Cognition Arousal/Alertness: Awake/alert Behavior During Therapy: WFL for tasks assessed/performed Overall Cognitive Status: Impaired/Different from baseline                                 General Comments: confused to place, scared of movement      General Comments      Exercises Other Exercises Other Exercises: supine ther-ex performed on B LE SLR and AP. 10 reps   Assessment/Plan    PT Assessment Patient needs continued PT services  PT Problem List Decreased strength;Decreased balance;Decreased mobility;Decreased cognition;Pain       PT Treatment Interventions Gait training;DME instruction;Therapeutic exercise;Balance training    PT Goals (Current goals can be found in the Care Plan section)  Acute Rehab PT Goals Patient Stated Goal: to decrease rib fx pain PT Goal Formulation: With patient Time For Goal Achievement: 08/20/20 Potential to Achieve Goals: Good    Frequency Min 2X/week   Barriers to discharge        Co-evaluation               AM-PAC PT "6 Clicks" Mobility  Outcome Measure Help needed turning from your back to your side while in a flat bed without using bedrails?: A Little Help needed moving from lying on your back to sitting on the side of a flat bed without using bedrails?: A Little Help needed moving to and from a bed to a chair (including a wheelchair)?: A Little Help needed standing up from a chair using your arms (e.g., wheelchair or bedside chair)?: A Little Help needed to walk in hospital room?: A Lot Help needed climbing 3-5 steps with a railing? : A Lot 6 Click Score: 16    End of Session Equipment Utilized During Treatment: Gait belt Activity Tolerance: Patient tolerated treatment well Patient left: in chair;with  chair alarm set Nurse Communication: Mobility status PT Visit Diagnosis: Muscle weakness (generalized) (M62.81);Difficulty in walking, not elsewhere classified (R26.2);Pain Pain - Right/Left: Left Pain - part of body:  (ribs)    Time: 0630-1601 PT Time Calculation (min) (ACUTE ONLY): 29 min   Charges:   PT Evaluation $PT Eval Low Complexity: 1 Low PT Treatments $Therapeutic Exercise: 8-22 mins        Greggory Stallion, PT, DPT [phone]   Serina Nichter 08/06/2020, 4:54 PM

## 2020-08-06 NOTE — ED Notes (Signed)
Pt watching tv.  Iv fluid infusing.

## 2020-08-06 NOTE — Progress Notes (Addendum)
Progress Note    Alexandria Roman  IRW:[id number] DOB: 01-14-1930  DOA: 08/05/2020 PCP: Adin Hector, MD      Brief Narrative:    Medical records reviewed and are as summarized below:  Alexandria Roman is a 87 y.o. female with medical history significant of hypertension, hyperlipidemia, hypothyroidism, depression with anxiety, chronic back pain, CKD stage III, UTI, recent hospitalization from 5/8 through 06/26/2020 for sepsis secondary to UTI and E. coli bacteremia.  She presented to the hospital with fall, cough, chest wall pain, lethargy and confusion.  She was hypoxic with oxygen saturation of 85% on room air, tachypneic and tachycardic in the emergency room.  Lactic acid was 2.7.   She was admitted to the hospital for severe sepsis secondary to community-acquired pneumonia complicated by acute hypoxic respiratory failure, AKI and acute metabolic encephalopathy.  She was also found to have left rib fracture.     Assessment/Plan:   Principal Problem:   Acute respiratory failure with hypoxia (HCC) Active Problems:   Essential hypertension   Hypothyroidism, unspecified   Depression with anxiety   Severe sepsis (HCC)   Chronic thoracic back pain   UTI (urinary tract infection)   CKD (chronic kidney disease), stage IIIa   Left rib fracture   Acute metabolic encephalopathy   Fall   Abnormal EKG   CAP (community acquired pneumonia)    Body mass index is 25.88 kg/m.  Severe sepsis secondary to community-acquired pneumonia, probable acute UTI: Continue empiric IV antibiotics.  Follow-up urine and blood cultures.  Acute hypoxic respiratory failure: Continue 2 L/min oxygen via nasal cannula and taper off oxygen as able.  Acute metabolic encephalopathy: Improved.  Continue supportive care.  AKI and hyponatremia: Improved  Mildly elevated troponin, abnormal EKG with T wave inversions in leads V1, V2, I and aVL: This is likely due to sepsis.  Repeat EKG.  Left  fifth rib fracture: Analgesics as needed for pain  S/p fall: PT and OT evaluation.  Large hiatal hernia: Patient and caregiver have been educated measures to reduce risk of aspiration.  Outpatient follow-up with GI.   Diet Order             Diet Heart Room service appropriate? Yes; Fluid consistency: Thin  Diet effective now                      Consultants: None  Procedures: None    Medications:    DULoxetine  30 mg Oral BID   gabapentin  400 mg Oral QHS   latanoprost  1 drop Both Eyes QHS   levothyroxine  75 mcg Oral Daily   LORazepam  0.25 mg Oral Daily   multivitamin-lutein  1 capsule Oral BID   timolol  1 drop Both Eyes Daily   Continuous Infusions:  ceFEPime (MAXIPIME) IV Stopped (08/05/20 1919)   lactated ringers 100 mL/hr at 08/06/20 0529   [START ON 08/07/2020] vancomycin       Anti-infectives (From admission, onward)    Start     Dose/Rate Route Frequency Ordered Stop   08/07/20 2000  vancomycin (VANCOREADY) IVPB 1250 mg/250 mL        1,250 mg 166.7 mL/hr over 90 Minutes Intravenous Every 48 hours 08/05/20 1803     08/05/20 1800  vancomycin (VANCOREADY) IVPB 1500 mg/300 mL        1,500 mg 150 mL/hr over 120 Minutes Intravenous  Once 08/05/20 1758 08/05/20 2230   08/05/20  1800  ceFEPIme (MAXIPIME) 2 g in sodium chloride 0.9 % 100 mL IVPB        2 g 200 mL/hr over 30 Minutes Intravenous Every 24 hours 08/05/20 1758     08/05/20 1415  cefTRIAXone (ROCEPHIN) 2 g in sodium chloride 0.9 % 100 mL IVPB  Status:  Discontinued        2 g 200 mL/hr over 30 Minutes Intravenous Every 24 hours 08/05/20 1407 08/05/20 1718   08/05/20 1415  azithromycin (ZITHROMAX) 500 mg in sodium chloride 0.9 % 250 mL IVPB  Status:  Discontinued        500 mg 250 mL/hr over 60 Minutes Intravenous Every 24 hours 08/05/20 1407 08/05/20 1718              Family Communication/Anticipated D/C date and plan/Code Status   DVT prophylaxis:      Code Status: Partial  Code  Family Communication: Private caregiver at the bedside Disposition Plan:    Status is: Inpatient  Remains inpatient appropriate because:Inpatient level of care appropriate due to severity of illness  Dispo: The patient is from: Home              Anticipated d/c is to: Home              Patient currently is not medically stable to d/c.   Difficult to place patient No           Subjective:   C/o left chest/rib pain.  Breathing is okay.  Alexandria Roman home private nurse was present at the bedside.  OT was also at the bedside.  Objective:    Vitals:   08/06/20 0630 08/06/20 0700 08/06/20 1000 08/06/20 1300  BP: (!) 141/72 (!) 142/84    Pulse: 95 98 92   Resp: 18 (!) 33 20   Temp:   98.5 F (36.9 C)   TempSrc:   Oral   SpO2: 98% 99% 94% 92%  Weight:      Height:       No data found.   Intake/Output Summary (Last 24 hours) at 08/06/2020 1316 Last data filed at 08/06/2020 1100 Gross per 24 hour  Intake --  Output 450 ml  Net -450 ml   Filed Weights   08/05/20 1354  Weight: 68.4 kg    Exam:  GEN: NAD SKIN: No rash EYES: EOMI ENT: MMM CV: RRR PULM: CTA B ABD: soft, ND, NT, +BS CNS: AAO x 3, non focal EXT: No edema or tenderness MSK: Left chest wall tenderness       Data Reviewed:   I have personally reviewed following labs and imaging studies:  Labs: Labs show the following:   Basic Metabolic Panel: Recent Labs  Lab 08/05/20 1350 08/06/20 0327  NA 132* 135  K 3.6 4.5  CL 102 102  CO2 22 27  GLUCOSE 167* 117*  BUN 26* 20  CREATININE 1.26* 0.98  CALCIUM 8.6* 8.6*   GFR Estimated Creatinine Clearance: 37.7 mL/min (by C-G formula based on SCr of 0.98 mg/dL). Liver Function Tests: Recent Labs  Lab 08/05/20 1350  AST 23  ALT 10  ALKPHOS 69  BILITOT 0.8  PROT 6.3*  ALBUMIN 3.4*   No results for input(s): LIPASE, AMYLASE in the last 168 hours. No results for input(s): AMMONIA in the last 168 hours. Coagulation profile Recent  Labs  Lab 08/05/20 1350  INR 1.1    CBC: Recent Labs  Lab 08/05/20 1350 08/06/20 0327  WBC 17.1* 13.8*  HGB 12.2 11.4*  HCT 36.9 35.1*  MCV 87.9 88.9  PLT 246 230   Cardiac Enzymes: No results for input(s): CKTOTAL, CKMB, CKMBINDEX, TROPONINI in the last 168 hours. BNP (last 3 results) No results for input(s): PROBNP in the last 8760 hours. CBG: No results for input(s): GLUCAP in the last 168 hours. D-Dimer: No results for input(s): DDIMER in the last 72 hours. Hgb A1c: No results for input(s): HGBA1C in the last 72 hours. Lipid Profile: No results for input(s): CHOL, HDL, LDLCALC, TRIG, CHOLHDL, LDLDIRECT in the last 72 hours. Thyroid function studies: No results for input(s): TSH, T4TOTAL, T3FREE, THYROIDAB in the last 72 hours.  Invalid input(s): FREET3 Anemia work up: No results for input(s): VITAMINB12, FOLATE, FERRITIN, TIBC, IRON, RETICCTPCT in the last 72 hours. Sepsis Labs: Recent Labs  Lab 08/05/20 1350 08/05/20 1610 08/05/20 1845 08/05/20 2245 08/06/20 0327  PROCALCITON  --   --  4.76  --   --   WBC 17.1*  --   --   --  13.8*  LATICACIDVEN 2.7* 2.4* 2.5* 1.4  --     Microbiology Recent Results (from the past 240 hour(s))  Resp Panel by RT-PCR (Flu A&B, Covid) Nasopharyngeal Swab     Status: None   Collection Time: 08/05/20  2:05 PM   Specimen: Nasopharyngeal Swab; Nasopharyngeal(NP) swabs in vial transport medium  Result Value Ref Range Status   SARS Coronavirus 2 by RT PCR NEGATIVE NEGATIVE Final    Comment: (NOTE) SARS-CoV-2 target nucleic acids are NOT DETECTED.  The SARS-CoV-2 RNA is generally detectable in upper respiratory specimens during the acute phase of infection. The lowest concentration of SARS-CoV-2 viral copies this assay can detect is 138 copies/mL. A negative result does not preclude SARS-Cov-2 infection and should not be used as the sole basis for treatment or other patient management decisions. A negative result may occur  with  improper specimen collection/handling, submission of specimen other than nasopharyngeal swab, presence of viral mutation(s) within the areas targeted by this assay, and inadequate number of viral copies(<138 copies/mL). A negative result must be combined with clinical observations, patient history, and epidemiological information. The expected result is Negative.  Fact Sheet for Patients:  EntrepreneurPulse.com.au  Fact Sheet for Healthcare Providers:  IncredibleEmployment.be  This test is no t yet approved or cleared by the [place] FDA and  has been authorized for detection and/or diagnosis of SARS-CoV-2 by FDA under an Emergency Use Authorization (EUA). This EUA will remain  in effect (meaning this test can be used) for the duration of the COVID-19 declaration under Section 564(b)(1) of the Act, 21 U.S.C.section 360bbb-3(b)(1), unless the authorization is terminated  or revoked sooner.       Influenza A by PCR NEGATIVE NEGATIVE Final   Influenza B by PCR NEGATIVE NEGATIVE Final    Comment: (NOTE) The Xpert Xpress SARS-CoV-2/FLU/RSV plus assay is intended as an aid in the diagnosis of influenza from Nasopharyngeal swab specimens and should not be used as a sole basis for treatment. Nasal washings and aspirates are unacceptable for Xpert Xpress SARS-CoV-2/FLU/RSV testing.  Fact Sheet for Patients: EntrepreneurPulse.com.au  Fact Sheet for Healthcare Providers: IncredibleEmployment.be  This test is not yet approved or cleared by the [place] FDA and has been authorized for detection and/or diagnosis of SARS-CoV-2 by FDA under an Emergency Use Authorization (EUA). This EUA will remain in effect (meaning this test can be used) for the duration of the COVID-19 declaration under Section 564(b)(1) of the Act, 21  U.S.C. section 360bbb-3(b)(1), unless the authorization is terminated  or revoked.  Performed at [facility], [address] [place], [place] [address]   Blood Culture (routine x 2)     Status: None (Preliminary result)   Collection Time: 08/05/20  2:26 PM   Specimen: BLOOD  Result Value Ref Range Status   Specimen Description BLOOD RIGHT ANTECUBITAL  Final   Special Requests   Final    BOTTLES DRAWN AEROBIC AND ANAEROBIC Blood Culture adequate volume   Culture   Final    NO GROWTH < 24 HOURS Performed at [facility], [address], [place], [place] [address]    Report Status PENDING  Incomplete  Blood Culture (routine x 2)     Status: None (Preliminary result)   Collection Time: 08/05/20  4:03 PM   Specimen: BLOOD  Result Value Ref Range Status   Specimen Description BLOOD BLOOD RIGHT HAND  Final   Special Requests   Final    BOTTLES DRAWN AEROBIC ONLY Blood Culture results may not be optimal due to an inadequate volume of blood received in culture bottles   Culture   Final    NO GROWTH < 24 HOURS Performed at [facility], [address], [place], [place] [address]    Report Status PENDING  Incomplete    Procedures and diagnostic studies:  DG Ribs Unilateral W/Chest Left  Result Date: 08/04/2020 CLINICAL DATA:  [place] 3 days ago EXAM: LEFT RIBS AND CHEST - 3+ VIEW COMPARISON:  04/19/2020 FINDINGS: Single-view chest demonstrates no focal opacity or pneumothorax. Small left pleural effusion. Large hiatal hernia. Left rib series slightly limited by linear artifact. Possible left fifth anterolateral rib fracture. IMPRESSION: Possible left fifth anterolateral rib fracture. Negative for pneumothorax. Possible small left effusion. Large hiatal hernia Electronically Signed   By: Donavan Foil M.D.   On: 08/04/2020 15:54   CT Head Wo Contrast  Result Date: 08/05/2020 CLINICAL DATA:  Fall last Thursday lethargic rib fracture EXAM: CT HEAD WITHOUT CONTRAST CT CERVICAL SPINE WITHOUT CONTRAST TECHNIQUE: Multidetector CT  imaging of the head and cervical spine was performed following the standard protocol without intravenous contrast. Multiplanar CT image reconstructions of the cervical spine were also generated. COMPARISON:  CT brain 04/19/2020, MRI 04/17/2020, CT 04/19/2020 FINDINGS: CT HEAD FINDINGS Brain: No acute territorial infarction, hemorrhage or intracranial mass. Mild atrophy. Moderate white matter hypodensity consistent with chronic small vessel ischemic change. Probable chronic lacunar infarct in the right basal ganglia. Stable ventricle size. Vascular: No hyperdense vessels.  Carotid vascular calcification Skull: Normal. Negative for fracture or focal lesion. Sinuses/Orbits: No acute finding. Other: None. CT CERVICAL SPINE FINDINGS Alignment: No subluxation.  Facet alignment is maintained Skull base and vertebrae: Craniovertebral junction is intact. Cervical vertebral bodies demonstrate normal stature. Moderate compression fracture T1 involves both the superior and inferior endplates with approximately 40% loss of vertebral body height anteriorly. Fracture involves the left lamina and extends to the inferior facet as before. Mild retropulsion of inferior vertebral body. Soft tissues and spinal canal: No prevertebral fluid or swelling. No visible canal hematoma. Disc levels: Moderate degenerative osteophytes with relatively patent disc spaces. Hypertrophic facet degenerative changes at multiple levels. Upper chest: Negative. Other: None IMPRESSION: 1. No CT evidence for acute intracranial abnormality. Atrophy and chronic small vessel ischemic change of the white matter 2. No acute osseous abnormality of the cervical spine. Moderate compression deformity of T1 vertebral, grossly unchanged as compared with CT from March 2022. Electronically Signed  By: Donavan Foil M.D.   On: 08/05/2020 15:28   CT CHEST WO CONTRAST  Result Date: 08/05/2020 CLINICAL DATA:  Fall history of rib fracture EXAM: CT CHEST WITHOUT CONTRAST  TECHNIQUE: Multidetector CT imaging of the chest was performed following the standard protocol without IV contrast. COMPARISON:  Radiographs 08/04/2020, CT 04/19/2020 FINDINGS: Cardiovascular: Limited evaluation without intravenous contrast. Moderate aortic atherosclerosis. No aneurysm. Coronary vascular calcification. Borderline to mild cardiomegaly. No pericardial effusion. Mediastinum/Nodes: Midline trachea. No suspicious thyroid mass. Borderline prevascular lymph node measuring 9 mm. Right precarinal lymph node measuring 10 mm. Large hiatal hernia. Lungs/Pleura: Trace left effusion. No pneumothorax. Partial atelectasis at the lower lobes. Scattered hazy pulmonary densities which may be due to atelectasis or small airways disease. Upper Abdomen: No acute abnormality. Musculoskeletal: Sternum is intact. Moderate compression deformity of T1. Moderate severe compression deformity of T8 with slight progressed loss of height diffusely of the vertebra. Minimal retropulsion inferior aspect of vertebral body. Subacute to chronic right fourth, 6, and seventh anterior to anterolateral rib fractures. Subacute to chronic right eighth posterior rib fracture. Acute appearing mildly displaced left sixth and seventh posterior rib fractures. IMPRESSION: 1. Trace left pleural effusion. Partial atelectasis in the lower lobes. No pneumothorax. 2. Acute appearing mildly displaced left sixth and seventh posterior rib fractures. Multiple subacute to chronic right rib fractures. 3. Large hiatal hernia 4. Moderate compression deformity T1 with moderate severe compression deformity of T8. T1 deformity grossly unchanged as compared with 04/19/2020, slight progression in loss of height diffusely of T8 vertebral body since 04/19/2020 Aortic Atherosclerosis (ICD10-I70.0). Electronically Signed   By: Donavan Foil M.D.   On: 08/05/2020 15:39   CT Cervical Spine Wo Contrast  Result Date: 08/05/2020 CLINICAL DATA:  Fall last Thursday  lethargic rib fracture EXAM: CT HEAD WITHOUT CONTRAST CT CERVICAL SPINE WITHOUT CONTRAST TECHNIQUE: Multidetector CT imaging of the head and cervical spine was performed following the standard protocol without intravenous contrast. Multiplanar CT image reconstructions of the cervical spine were also generated. COMPARISON:  CT brain 04/19/2020, MRI 04/17/2020, CT 04/19/2020 FINDINGS: CT HEAD FINDINGS Brain: No acute territorial infarction, hemorrhage or intracranial mass. Mild atrophy. Moderate white matter hypodensity consistent with chronic small vessel ischemic change. Probable chronic lacunar infarct in the right basal ganglia. Stable ventricle size. Vascular: No hyperdense vessels.  Carotid vascular calcification Skull: Normal. Negative for fracture or focal lesion. Sinuses/Orbits: No acute finding. Other: None. CT CERVICAL SPINE FINDINGS Alignment: No subluxation.  Facet alignment is maintained Skull base and vertebrae: Craniovertebral junction is intact. Cervical vertebral bodies demonstrate normal stature. Moderate compression fracture T1 involves both the superior and inferior endplates with approximately 40% loss of vertebral body height anteriorly. Fracture involves the left lamina and extends to the inferior facet as before. Mild retropulsion of inferior vertebral body. Soft tissues and spinal canal: No prevertebral fluid or swelling. No visible canal hematoma. Disc levels: Moderate degenerative osteophytes with relatively patent disc spaces. Hypertrophic facet degenerative changes at multiple levels. Upper chest: Negative. Other: None IMPRESSION: 1. No CT evidence for acute intracranial abnormality. Atrophy and chronic small vessel ischemic change of the white matter 2. No acute osseous abnormality of the cervical spine. Moderate compression deformity of T1 vertebral, grossly unchanged as compared with CT from March 2022. Electronically Signed   By: Donavan Foil M.D.   On: 08/05/2020 15:28   DG Chest  Portable 1 View  Result Date: 08/05/2020 CLINICAL DATA:  Altered mental status EXAM: PORTABLE CHEST 1 VIEW COMPARISON:  08/04/2020 FINDINGS:  Enlarged cardiomediastinal silhouette with trace left effusion and hazy atelectasis at the bases. Enlarged mediastinum likely due to portable technique and rotation. Mild vascular congestion. No pneumothorax. Large hiatal hernia IMPRESSION: 1. Enlarged cardiomediastinal silhouette with slight vascular congestion 2. Trace left effusion with hazy atelectasis at the bases 3. Large hiatal hernia Electronically Signed   By: Donavan Foil M.D.   On: 08/05/2020 15:40               LOS: 1 day   Jaqwan Wieber  [facility]   Pager on [email or web address]. If 7PM-7AM, please contact night-coverage at [email or web address]     08/06/2020, 1:16 PM

## 2020-08-06 NOTE — Evaluation (Signed)
Clinical/Bedside Swallow Evaluation Patient Details  Name: Alexandria Roman MRN: [id number] Date of Birth: 1929/04/17  Today's Date: 08/06/2020 Time: SLP Start Time (ACUTE ONLY): 0820 SLP Stop Time (ACUTE ONLY): 0925 SLP Time Calculation (min) (ACUTE ONLY): 65 min  Past Medical History:  Past Medical History:  Diagnosis Date   Cancer ([place])    Hypertension    Past Surgical History:  Past Surgical History:  Procedure Laterality Date   ABDOMINAL HYSTERECTOMY     HIP ARTHROPLASTY Right 05/20/2018   Procedure: ARTHROPLASTY BIPOLAR HIP (HEMIARTHROPLASTY);  Surgeon: Thornton Park, MD;  Location: [facility] ORS;  Service: Orthopedics;  Laterality: Right;   HPI:  Pt is a 87 y.o. female with medical history significant of hypertension, hyperlipidemia, hypothyroidism, spinal stenosis, Large Hiatal Hernia, depression with anxiety, chronic back pain, CKD stage III, UTI, who presents with fall, cough, chest wall pain, confusion. Patient was recently hospitalized from 5/8-5/12 due to sepsis secondary to UTI.  Blood culture was positive for E. coli. Per caregiver at the bedside, pt fell last Thursday, but refused to come to hospital at that time.  Patient developed left-sided chest wall pain.  Patient was seen in urgent care yesterday, and had chest x-ray which showed rib fracture.  CT of Chest and CXR revealed: "Trace left pleural effusion. Partial atelectasis in the lower  lobes. No pneumothorax. 2. Acute appearing mildly displaced left sixth and seventh posterior  rib fractures. Multiple subacute to chronic right rib fractures. 3. Large hiatal hernia.".   Assessment / Plan / Recommendation Clinical Impression  Pt appears to present w/ adequate oropharyngeal phase swallowing function w/ No overt oropharyngeal phase dysphagia appreciated w/ trials given; No neuromuscular swallowing deficits appreciated. Pt appears at reduced risk for aspiration from an oropharyngeal phase standpoint following general aspiration  precautions. However, pt has a baseline of a Larger Hiatal Hernia per Imaging reports and potential REFLUX; not on a PPI currently. ANY Dysmotility or Regurgitation of Reflux material can increase risk for aspiration of the Reflux material during Retrograde backflow thus impact Voicing and Pulmonary status. Pt described ongoing issues w/ "pressure" or "stuck feelings in my chest" when eating meals pointing to mid-upper sternum area then described the discomfort moves superiorly pointing to her Sternal Notch area. She feels this impacts her ability to take full meals stating she gets "full".   Pt was positioned Upright then consumed trials of thin liquids Via Cup (No straws to lessen air swallowed), then trials of puree and mech soft foods w/ no immediate, overt clinical s/s of aspiration noted; clear vocal quality b/t trials, no decline in pulmonary status, no multiple swallows noted post initial pharyngeal swallow. RR remained 19-21; O2 sats 98%. Oral phase appeared [facility] for bolus management, mastication, and timely A-P transfer/clearing of material. OM exam was [facility] for oral clearing; lingual/labial movements. No unilateral weakness. Speech clear. Of Note, pt demonstrated fairly consistent Belching post trials of liquids, puree x1. No significant Globus feelings w/ the trials consumed. Rest Breaks given b/t bites.    Recommend continue Regluar diet for ease of choice of manageable foods as tolerates(w/ less meats/breads in diet; cut, moistened foods) w/ thin liquids via Cup/less straw use d/t air swallowing; general aspiration precautions. Rest Breaks during meals/oral intake to allow for Esophageal clearing. REFLUX precautions strongly recommended to lessen chance for Regurgitation. Recommend pt f/u w/ GI for further assessment of Esophageal Dysmotility, education of impact of Large HIatal Hernia, and any management of Reflux and tx as indicated.  Much discussion w/  pt and Caregiver and handouts given on  Esophageal dysmotility, Reflux. MD to reconsult ST services if any new needs while admitted. NSG updated. SLP Visit Diagnosis: Dysphagia, unspecified (R13.10) (Large Hiatal Hernia baseline)    Aspiration Risk   (reduced from an oropharyngeal phase standpoint)    Diet Recommendation  Regluar diet for ease of choice of manageable foods as tolerates(w/ less meats/breads in diet overall; cut, moistened foods) w/ thin liquids via Cup -- less straw use d/t air swallowing; general aspiration precautions. Rest Breaks during meals/oral intake to allow for Esophageal clearing. REFLUX precautions strongly recommended to lessen chance for Regurgitation. AVOID Carbonated drinks w/ meals, and do NOT lie down for ~60 mins after meals; stop eating 2-3 hours BEFORE bedtime.  Medication Administration: Whole meds with puree (IF needed for ease of swallowing)    Other  Recommendations Recommended Consults: Consider GI evaluation;Consider esophageal assessment (d/t Large Hiatal Hernia baseline; Dietician f/u) Oral Care Recommendations: Oral care BID;Oral care before and after PO;Patient independent with oral care Other Recommendations:  (n/a)   Follow up Recommendations None      Frequency and Duration  (n/a)   (n/a)       Prognosis Prognosis for Safe Diet Advancement: Fair (-Good) Barriers to Reach Goals: Time post onset;Severity of deficits (Large Hiatal Hernia baseline) Barriers/Prognosis Comment: Large Hiatal Hernia baseline      Swallow Study   General Date of Onset: 08/05/20 HPI: Pt is a 87 y.o. female with medical history significant of hypertension, hyperlipidemia, hypothyroidism, spinal stenosis, Large Hiatal Hernia, depression with anxiety, chronic back pain, CKD stage III, UTI, who presents with fall, cough, chest wall pain, confusion. Patient was recently hospitalized from 5/8-5/12 due to sepsis secondary to UTI.  Blood culture was positive for E. coli. Per caregiver at the bedside, pt fell  last Thursday, but refused to come to hospital at that time.  Patient developed left-sided chest wall pain.  Patient was seen in urgent care yesterday, and had chest x-ray which showed rib fracture.  CT of Chest and CXR revealed: "Trace left pleural effusion. Partial atelectasis in the lower  lobes. No pneumothorax. 2. Acute appearing mildly displaced left sixth and seventh posterior  rib fractures. Multiple subacute to chronic right rib fractures. 3. Large hiatal hernia.". Type of Study: Bedside Swallow Evaluation Previous Swallow Assessment: none Diet Prior to this Study: Regular;Thin liquids (at home as well) Temperature Spikes Noted: No (wbc 13.8) Respiratory Status: Nasal cannula (2L) History of Recent Intubation: No Behavior/Cognition: Alert;Cooperative;Pleasant mood;Requires cueing (min drowsy) Oral Cavity Assessment: Within Functional Limits Oral Care Completed by SLP: Recent completion by staff Oral Cavity - Dentition: Adequate natural dentition Vision: Functional for self-feeding Self-Feeding Abilities: Able to feed self;Needs assist;Needs set up Patient Positioning: Upright in bed (needed full positioning support for Upright sitting) Baseline Vocal Quality: Normal Volitional Cough: Strong Volitional Swallow: Able to elicit    Oral/Motor/Sensory Function Overall Oral Motor/Sensory Function: Within functional limits   Ice Chips Ice chips: Within functional limits Presentation: Spoon (fed; 2 trials)   Thin Liquid Thin Liquid: Within functional limits Presentation: Cup;Self Fed (~4 ozs total)    Nectar Thick Nectar Thick Liquid: Not tested   Honey Thick Honey Thick Liquid: Not tested   Puree Puree: Within functional limits Presentation: Spoon (fed; 5 trials)   Solid     Solid: Within functional limits (grossly - mech soft trials) Presentation: Spoon (fed; 5 trials)         Orinda Kenner, MS, [place] Speech Language Pathologist  Rehab  Services [phone] Eula Jaster 08/06/2020,3:47 PM

## 2020-08-06 NOTE — Consult Note (Signed)
Pharmacy Antibiotic Note  MAHALIA Roman is a 87 y.o. female admitted on 08/05/2020 with pneumonia.  Pharmacy has been consulted for Vancomycin and Cefepime dosing. Patient received single doses of rocephin and Azithromycin in ED.  Plan: 1) Adjusting dose to Vancomycin 750 mg IV Q 24 hrs. Goal AUC 400-550. Expected AUC: 463.7 Expected Css: 13.1 SCr used: 0.98  2) Cefepime 2g Q12 hours   Height: 5\' 4"  (162.6 cm) Weight: 68.4 kg (150 lb 12.7 oz) IBW/kg (Calculated) : 54.7  Temp (24hrs), Avg:98.5 F (36.9 C), Min:98.5 F (36.9 C), Max:98.5 F (36.9 C)  Recent Labs  Lab 08/05/20 1350 08/05/20 1610 08/05/20 1845 08/05/20 2245 08/06/20 0327  WBC 17.1*  --   --   --  13.8*  CREATININE 1.26*  --   --   --  0.98  LATICACIDVEN 2.7* 2.4* 2.5* 1.4  --      Estimated Creatinine Clearance: 37.7 mL/min (by C-G formula based on SCr of 0.98 mg/dL).    Allergies  Allergen Reactions   Amoxicillin-Pot Clavulanate Diarrhea   Cephalexin Other (See Comments) and Nausea And Vomiting   Hydrochlorothiazide Other (See Comments) and Nausea And Vomiting   Hydrocodone Bit-Homatrop Mbr Other (See Comments)   Hydrocodone-Chlorpheniramine Other (See Comments)   Metronidazole Other (See Comments)    Other reaction(s): Other (See Comments) Other Reaction: Not Assessed Other Reaction: Not Assessed   Moxifloxacin Other (See Comments)   Prednisone Nausea Only   Simvastatin Other (See Comments)   Sulfa Antibiotics Other (See Comments)    Other reaction(s): Unknown   Sulfasalazine Other (See Comments)    Other reaction(s): Unknown   Tramadol Other (See Comments)   Celecoxib Nausea And Vomiting   Codeine Nausea Only    Other reaction(s): Other (See Comments) Other reaction(s): Unknown   Cyclobenzaprine Other (See Comments) and Nausea Only    Other reaction(s): Other (See Comments)   Oxycodone-Acetaminophen Nausea Only and Nausea And Vomiting   Penicillin V Potassium Rash    Other reaction(s):  Other (See Comments) Other reaction(s): Unknown    Antimicrobials this admission: Vancomycin 6/21 >>  Cefepime 6/21 >>  Azithro/Rocephin 6/21 x 1  Microbiology results: 6/21 BCx: NGTD 6/21 UCx: pending   Thank you for allowing pharmacy to be a part of this patient's care.  Everrett Lacasse A Fenix Ruppe 08/06/2020 3:10 PM

## 2020-08-07 LAB — BASIC METABOLIC PANEL
Anion gap: 9 (ref 5–15)
BUN: 12 mg/dL (ref 8–23)
CO2: 26 mmol/L (ref 22–32)
Calcium: 8.7 mg/dL — ABNORMAL LOW (ref 8.9–10.3)
Chloride: 100 mmol/L (ref 98–111)
Creatinine, Ser: 0.64 mg/dL (ref 0.44–1.00)
GFR, Estimated: >60 mL/min (ref >60)
Glucose, Bld: 115 mg/dL — ABNORMAL HIGH (ref 70–99)
Potassium: 3.1 mmol/L — ABNORMAL LOW (ref 3.5–5.1)
Sodium: 135 mmol/L (ref 135–145)

## 2020-08-07 LAB — CBC WITH DIFFERENTIAL/PLATELET
Abs Immature Granulocytes: 0.04 10*3/uL (ref 0.00–0.07)
Basophils Absolute: 0.1 10*3/uL (ref 0.0–0.1)
Basophils Relative: 1 %
Eosinophils Absolute: 0.1 10*3/uL (ref 0.0–0.5)
Eosinophils Relative: 1 %
HCT: 35.4 % — ABNORMAL LOW (ref 36.0–46.0)
Hemoglobin: 12 g/dL (ref 12.0–15.0)
Immature Granulocytes: 0 %
Lymphocytes Relative: 12 %
Lymphs Abs: 1.3 10*3/uL (ref 0.7–4.0)
MCH: 28.9 pg (ref 26.0–34.0)
MCHC: 33.9 g/dL (ref 30.0–36.0)
MCV: 85.3 fL (ref 80.0–100.0)
Monocytes Absolute: 1.1 10*3/uL — ABNORMAL HIGH (ref 0.1–1.0)
Monocytes Relative: 10 %
Neutro Abs: 8.1 10*3/uL — ABNORMAL HIGH (ref 1.7–7.7)
Neutrophils Relative %: 76 %
Platelets: 251 10*3/uL (ref 150–400)
RBC: 4.15 MIL/uL (ref 3.87–5.11)
RDW: 15 % (ref 11.5–15.5)
WBC: 10.6 10*3/uL — ABNORMAL HIGH (ref 4.0–10.5)
nRBC: 0 % (ref 0.0–0.2)

## 2020-08-07 LAB — MAGNESIUM: Magnesium: 1.7 mg/dL (ref 1.7–2.4)

## 2020-08-07 LAB — STREP PNEUMONIAE URINARY ANTIGEN: Strep Pneumo Urinary Antigen: NEGATIVE

## 2020-08-07 LAB — MRSA PCR SCREENING: MRSA by PCR: NEGATIVE

## 2020-08-07 MED ORDER — MAGNESIUM OXIDE -MG SUPPLEMENT 400 (240 MG) MG PO TABS
800.0000 mg | ORAL_TABLET | Freq: Two times a day (BID) | ORAL | Status: AC
  Administered 2020-08-07: 800 mg via ORAL
  Filled 2020-08-07: qty 2

## 2020-08-07 MED ORDER — ENOXAPARIN SODIUM 40 MG/0.4ML IJ SOSY
40.0000 mg | PREFILLED_SYRINGE | INTRAMUSCULAR | Status: DC
  Administered 2020-08-07 – 2020-08-08 (×2): 40 mg via SUBCUTANEOUS
  Filled 2020-08-07 (×2): qty 0.4

## 2020-08-07 MED ORDER — POTASSIUM CHLORIDE CRYS ER 20 MEQ PO TBCR
40.0000 meq | EXTENDED_RELEASE_TABLET | ORAL | Status: AC
  Administered 2020-08-07 (×2): 40 meq via ORAL
  Filled 2020-08-07 (×2): qty 2

## 2020-08-07 MED ORDER — SODIUM CHLORIDE 0.9 % IV SOLN
INTRAVENOUS | Status: DC | PRN
  Administered 2020-08-07: 250 mL via INTRAVENOUS

## 2020-08-07 NOTE — Progress Notes (Signed)
Physical Therapy Treatment Patient Details Name: Alexandria Roman MRN: [id number] DOB: 1929-11-27 Today's Date: 08/07/2020    History of Present Illness Alexandria Roman is a 87 y.o. female admitted on 08/05/2020 with pneumonia.  Now complains of cough/confusion. History includes HTN, HLD, depression, and UTI. Reports initial fall over 3 months ago from tripping over dog. Now with + L 5th, 6th, 7th rib fractures. Also reports of other falls.    PT Comments    Pt is making gradual progress towards goals with ability to perform increased supine there-ex this date. Resistant to OOB due to rib pain. Recently medicated per caregiver. +2 for bed mobility, however pain improves once upright. Able to transition to chair safely only requiring 1 assist. Used RW, per baseline. Will continue to progress as able.  Follow Up Recommendations  Home health PT;Supervision/Assistance - 24 hour (return to [facility])     Equipment Recommendations  None recommended by PT    Recommendations for Other Services       Precautions / Restrictions Precautions Precautions: Fall Precaution Comments: Rib fxs Restrictions Weight Bearing Restrictions: No    Mobility  Bed Mobility Overal bed mobility: Needs Assistance Bed Mobility: Rolling;Supine to Sit Rolling: Max assist;+2 for physical assistance   Supine to sit: Max assist;+2 for physical assistance     General bed mobility comments: pain limited this date, needing increased assistance for log rolling and transition to EOB. Once seated, uncontrolled decent back to supine due to pain. +2 for bed mobility. Max/total assist on further attempt to sit at EOB, progressing to supervision for static sitting balance.    Transfers Overall transfer level: Needs assistance Equipment used: Rolling walker (2 wheeled) Transfers: Sit to/from Stand Sit to Stand: Min guard         General transfer comment: safe technique. Complains of slight dizziness/nausea with  mobility  Ambulation/Gait Ambulation/Gait assistance: Min guard Gait Distance (Feet): 3 Feet Assistive device: Rolling walker (2 wheeled) Gait Pattern/deviations: Step-to pattern     General Gait Details: follows commands. Safe technique with no buckling noted   Stairs             Wheelchair Mobility    Modified Rankin (Stroke Patients Only)       Balance Overall balance assessment: Needs assistance;History of Falls Sitting-balance support: Feet supported Sitting balance-Leahy Scale: Good     Standing balance support: Bilateral upper extremity supported Standing balance-Leahy Scale: Fair                              Cognition Arousal/Alertness: Awake/alert Behavior During Therapy: WFL for tasks assessed/performed Overall Cognitive Status: Impaired/Different from baseline                                 General Comments: pleasant, fearful of mobility      Exercises Other Exercises Other Exercises: supine ther-ex performed on B LE including bridging, AP, SLRs, hip abd/add, resisted hip add. All ther-ex x 10 reps with supervision.    General Comments        Pertinent Vitals/Pain Pain Assessment: Faces Faces Pain Scale: Hurts even more Pain Location: L ribs Pain Descriptors / Indicators: Grimacing Pain Intervention(s): Limited activity within patient's tolerance;Repositioned    Home Living                      Prior Function  PT Goals (current goals can now be found in the care plan section) Acute Rehab PT Goals Patient Stated Goal: to decrease rib fx pain PT Goal Formulation: With patient Time For Goal Achievement: 08/20/20 Potential to Achieve Goals: Good Progress towards PT goals: Progressing toward goals    Frequency    Min 2X/week      PT Plan Current plan remains appropriate    Co-evaluation              AM-PAC PT "6 Clicks" Mobility   Outcome Measure  Help needed turning  from your back to your side while in a flat bed without using bedrails?: A Lot Help needed moving from lying on your back to sitting on the side of a flat bed without using bedrails?: A Lot Help needed moving to and from a bed to a chair (including a wheelchair)?: A Little Help needed standing up from a chair using your arms (e.g., wheelchair or bedside chair)?: A Little Help needed to walk in hospital room?: A Lot Help needed climbing 3-5 steps with a railing? : A Lot 6 Click Score: 14    End of Session Equipment Utilized During Treatment: Gait belt Activity Tolerance: Patient tolerated treatment well Patient left: in chair;with chair alarm set;with family/visitor present Nurse Communication: Mobility status PT Visit Diagnosis: Muscle weakness (generalized) (M62.81);Difficulty in walking, not elsewhere classified (R26.2);Pain Pain - Right/Left: Left Pain - part of body:  (ribs)     Time: 8250-5397 PT Time Calculation (min) (ACUTE ONLY): 28 min  Charges:  $Gait Training: 8-22 mins $Therapeutic Exercise: 8-22 mins                     Alexandria Roman, PT, DPT [phone]    Alexandria Roman 08/07/2020, 4:15 PM

## 2020-08-07 NOTE — Progress Notes (Signed)
Occupational Therapy Treatment Patient Details Name: Alexandria Roman MRN: [id number] DOB: [birth year] Today's Date: 08/07/2020    History of present illness Alexandria Roman is a 87 y.o. female admitted on 08/05/2020 with pneumonia.  Now complains of cough/confusion. History includes HTN, HLD, depression, and UTI. Reports initial fall over 3 months ago from tripping over dog. Now with + L 5th, 6th, 7th rib fractures. Also reports of other falls.   OT comments  Upon entering the room, pt having just transferred into recliner chair with PT. Pt reporting increased pain with mobility and hugging pillow to L ribs. OT provided pt and caregiver in room with energy conservation education handouts and briefly reviewed with continued education needed for next session. Pt and caregiver with questions regarding food because pt has not liked the food and OT provided pt with menu and discussed importance of calories for healing. Pt remained in recliner chair and given goal to remain in chair until she has ate dinner. Pt continues to benefit from OT intervention.   Follow Up Recommendations  Home health OT;Supervision/Assistance - 24 hour    Equipment Recommendations  3 in 1 bedside commode    Recommendations for Other Services      Precautions / Restrictions Precautions Precautions: Fall Precaution Comments: Rib fxs Restrictions Weight Bearing Restrictions: No       Mobility Bed Mobility Overal bed mobility: Needs Assistance Bed Mobility: Rolling;Supine to Sit Rolling: Max assist;+2 for physical assistance   Supine to sit: Max assist;+2 for physical assistance     General bed mobility comments: seated in recliner chair having finished with PT session    Transfers Overall transfer level: Needs assistance Equipment used: Rolling walker (2 wheeled) Transfers: Sit to/from Stand Sit to Stand: Min guard         General transfer comment: safe technique. Complains of slight dizziness/nausea  with mobility    Balance Overall balance assessment: Needs assistance;History of Falls Sitting-balance support: Feet supported Sitting balance-Leahy Scale: Good     Standing balance support: Bilateral upper extremity supported Standing balance-Leahy Scale: [place] Patient Visual Report: No change from baseline            Cognition Arousal/Alertness: Awake/alert Behavior During Therapy: WFL for tasks assessed/performed Overall Cognitive Status: Impaired/Different from baseline Area of Impairment: Safety/judgement                         Safety/Judgement: Decreased awareness of safety;Decreased awareness of deficits     General Comments: pleasant, fearful of mobility        Exercises Other Exercises Other Exercises: supine ther-ex performed on B LE including bridging, AP, SLRs, hip abd/add, resisted hip add. All ther-ex x 10 reps with supervision.    Pertinent Vitals/ Pain       Pain Assessment: Faces Faces Pain Scale: Hurts even more Pain Location: L ribs Pain Descriptors / Indicators: Grimacing Pain Intervention(s): Limited activity within patient's tolerance;Repositioned   Frequency  Min 2X/week        Progress Toward Goals  OT Goals(current goals can now be found in the care plan section)  Progress towards OT goals: Progressing toward goals  Acute Rehab OT Goals Patient Stated Goal: to decrease rib fx pain OT Goal Formulation: With patient Time For Goal Achievement: 08/20/20 Potential  to Achieve Goals: [place] Discharge plan remains appropriate;Frequency remains appropriate       AM-PAC OT "6 Clicks" Daily Activity     Outcome Measure   Help from another person eating meals?: None Help from another person taking care of personal grooming?: A Little Help from another person toileting, which includes using toliet, bedpan, or urinal?: A Little Help from another person bathing (including  washing, rinsing, drying)?: A Little Help from another person to put on and taking off regular upper body clothing?: A Little Help from another person to put on and taking off regular lower body clothing?: A Little 6 Click Score: 19    End of Session    OT Visit Diagnosis: Unsteadiness on feet (R26.81);Repeated falls (R29.6);Muscle weakness (generalized) (M62.81);Pain Pain - Right/Left: Left   Activity Tolerance Patient limited by pain   Patient Left with call bell/phone within reach;with family/visitor present;in chair   Nurse Communication Mobility status;Patient requests pain meds        Time: 9741-6384 OT Time Calculation (min): 24 min  Charges: OT General Charges $OT Visit: 1 Visit OT Treatments $Self Care/Home Management : 23-37 mins  Darleen Crocker, MS, OTR/L , CBIS ascom [phone]  08/07/20, 4:36 PM

## 2020-08-07 NOTE — Progress Notes (Signed)
Speech Language Pathology Treatment: Dysphagia (Esophageal ed)  Patient Details Name: Alexandria Roman MRN: [id number] DOB: [birth year] Today's Date: 08/07/2020 Time: 0630-1601 SLP Time Calculation (min) (ACUTE ONLY): 35 min  Assessment / Plan / Recommendation Clinical Impression  Pt seen for ongoing assessment of swallowing and toleration of diet; education re: swallowing, diet consistency rec'd, and general aspiration precautions including recommendation for safer swallowing of Pills by use of a PUREE -- for all Pill swallowing. Pt has a Baseline of Large Hiatal Hernia and reports some difficulty when swallowing "Large" pills at Baseline. Pt's personal Caregiver present for Education. Discussed w/ pt the current diet consistency including use of Cut/minced meats, moistened cooked foods for ease of overall intake and Esophageal clearing d/t Dysmotility of the Esophagus. Pt, Caregiver, and NSG denied any overt s/s of aspiration w/ oral intake at recent meals except a mild throat clearing intermittently which is Baseline for pt per Caregiver report -- suspect related to pt's Esophgeal phase Dysmotlity(large Hiatal Hernia). Talked about reducing risks for REFLUX and aspiration by following general REFLUX and aspiration precautions to include Small sips Slowly, No straws, TIME b/t bites and sips, and Sit fully Upright w/ all oral intake. Recommended reducing distractions including Talking at meals. Also discussed food consistencies and options, preparation, and use of condiments to soften foods also. Encouragement given for ALL Pills to be swallowed using a Puree even Crushed for safer clearing of the oropharynx and the Esophagus also -- d/t Large Hiatal Hernia.   Handouts given yesterday to Caregiver on the above; Pill swallowing options discussed. Precautions placed in current room.   Recommend continue a more Dysphagia level 3 Mech Soft(Cut meats)/Regular diet w/ Thin liquids via Cup; aspiration  precautions; Rreflux precautions; Pills in a Puree for safer swallowing. ST services will be available for any further education as needed; pt appears at her Baseline w/ oropharyngeal phase swallowing and Esophageal phase Dysmotility; Education completed w/ pt, personal Caregiver. NSG to reconsult if new needs arise while admitted.      HPI HPI: Pt is a 87 y.o. female with medical history significant of hypertension, hyperlipidemia, hypothyroidism, spinal stenosis, Large Hiatal Hernia, depression with anxiety, chronic back pain, CKD stage III, UTI, who presents with fall, cough, chest wall pain, confusion. Unsure of pt's full Cognitive status at Baseline. Patient was recently hospitalized from 5/8-5/12 due to sepsis secondary to UTI.  Blood culture was positive for E. coli. Per caregiver at the bedside, pt fell last Thursday, but refused to come to hospital at that time.  Patient developed left-sided chest wall pain.  Patient was seen in urgent care yesterday, and had chest x-ray which showed rib fracture.  CT of Chest and CXR revealed: "Trace left pleural effusion. Partial atelectasis in the lower  lobes. No pneumothorax. 2. Acute appearing mildly displaced left sixth and seventh posterior  rib fractures. Multiple subacute to chronic right rib fractures. 3. Large hiatal hernia.".      SLP Plan  All goals met       Recommendations  Diet recommendations: Dysphagia 3 (mechanical soft);Thin liquid (moistened foods; avoid meats/breads/salads) Liquids provided via: Cup;No straw Medication Administration: Whole meds with puree (vs Crushed w/ puree for ease of swallowing/Esophageal clearing) Supervision: Patient able to self feed;Intermittent supervision to cue for compensatory strategies Compensations: Minimize environmental distractions;Slow rate;Small sips/bites;Lingual sweep for clearance of pocketing;Follow solids with liquid Postural Changes and/or Swallow Maneuvers: Out of bed for meals;Seated  upright 90 degrees;Upright 30-60 min after meal (REFLUX precautions)  General recommendations:  (Dietician f/u; GI f/u for education) Oral Care Recommendations: Oral care BID;Oral care before and after PO;Patient independent with oral care Follow up Recommendations: None SLP Visit Diagnosis: Dysphagia, unspecified (R13.10) (Esophageal phase dysmotility -- Large Hiatal Hernia) Plan: All goals met       GO                  Alexandria Kenner, MS, CCC-SLP Speech Language Pathologist Rehab Services [phone] [facility] 08/07/2020, 5:47 PM

## 2020-08-07 NOTE — Progress Notes (Signed)
Progress Note    Alexandria Roman  ENI:[id number] DOB: Mar 11, 1929  DOA: 08/05/2020 PCP: Adin Hector, MD      Brief Narrative:    Medical records reviewed and are as summarized below:  Alexandria Roman is a 87 y.o. female with medical history significant of hypertension, hyperlipidemia, hypothyroidism, depression with anxiety, chronic back pain, CKD stage III, UTI, recent hospitalization from 5/8 through 06/26/2020 for sepsis secondary to UTI and E. coli bacteremia.  She presented to the hospital with fall, cough, chest wall pain, lethargy and confusion.  She was hypoxic with oxygen saturation of 85% on room air, tachypneic and tachycardic in the emergency room.  Lactic acid was 2.7.   She was admitted to the hospital for severe sepsis secondary to community-acquired pneumonia complicated by acute hypoxic respiratory failure, AKI and acute metabolic encephalopathy.  She was also found to have left rib fracture.     Assessment/Plan:   Principal Problem:   Acute respiratory failure with hypoxia (HCC) Active Problems:   Essential hypertension   Hypothyroidism, unspecified   Depression with anxiety   Severe sepsis (HCC)   Chronic thoracic back pain   UTI (urinary tract infection)   CKD (chronic kidney disease), stage IIIa   Left rib fracture   Acute metabolic encephalopathy   Fall   Abnormal EKG   CAP (community acquired pneumonia)    Body mass index is 25.88 kg/m.  Severe sepsis secondary to community-acquired pneumonia, probable acute UTI: Urine cultures growing E. coli.  Previous urine culture from 06/22/2020 also showed E. coli.  Continue empiric IV antibiotics.  Discontinue IV fluids.  Acute hypoxic respiratory failure: Improved.  She is tolerating room air.    Acute metabolic encephalopathy: Improved.  Continue supportive care.  Hypokalemia: Replete potassium.  Magnesium is borderline normal.  Add oral magnesium oxide.  Mildly elevated troponin, abnormal  EKG with T wave inversions in leads V1, V2, I and aVL: This is likely due to sepsis.  Repeat EKG on 08/06/2020 showed T wave inversions in leads I and aVL.  Left fifth rib fracture: Analgesics as needed for pain  S/p fall: PT and OT evaluation.  AKI and hyponatremia: Improved.  Large hiatal hernia: Patient and caregiver have been educated measures to reduce risk of aspiration.  Outpatient follow-up with GI.   Diet Order             Diet Heart Room service appropriate? Yes with Assist; Fluid consistency: Thin  Diet effective now                      Consultants: None  Procedures: None    Medications:    DULoxetine  30 mg Oral BID   gabapentin  400 mg Oral QHS   latanoprost  1 drop Both Eyes QHS   levothyroxine  75 mcg Oral Daily   LORazepam  0.25 mg Oral Daily   multivitamin-lutein  1 capsule Oral BID   potassium chloride  40 mEq Oral Q4H   timolol  1 drop Both Eyes Daily   Continuous Infusions:  ceFEPime (MAXIPIME) IV 2 g (08/07/20 6144)   vancomycin 750 mg (08/06/20 2005)     Anti-infectives (From admission, onward)    Start     Dose/Rate Route Frequency Ordered Stop   08/07/20 2000  vancomycin (VANCOREADY) IVPB 1250 mg/250 mL  Status:  Discontinued        1,250 mg 166.7 mL/hr over 90 Minutes Intravenous Every  48 hours 08/05/20 1803 08/06/20 1510   08/06/20 2200  ceFEPIme (MAXIPIME) 2 g in sodium chloride 0.9 % 100 mL IVPB        2 g 200 mL/hr over 30 Minutes Intravenous Every 12 hours 08/06/20 1512     08/06/20 2000  vancomycin (VANCOREADY) IVPB 750 mg/150 mL        750 mg 150 mL/hr over 60 Minutes Intravenous Every 24 hours 08/06/20 1510     08/05/20 1800  vancomycin (VANCOREADY) IVPB 1500 mg/300 mL        1,500 mg 150 mL/hr over 120 Minutes Intravenous  Once 08/05/20 1758 08/05/20 2230   08/05/20 1800  ceFEPIme (MAXIPIME) 2 g in sodium chloride 0.9 % 100 mL IVPB  Status:  Discontinued        2 g 200 mL/hr over 30 Minutes Intravenous Every 24  hours 08/05/20 1758 08/06/20 1512   08/05/20 1415  cefTRIAXone (ROCEPHIN) 2 g in sodium chloride 0.9 % 100 mL IVPB  Status:  Discontinued        2 g 200 mL/hr over 30 Minutes Intravenous Every 24 hours 08/05/20 1407 08/05/20 1718   08/05/20 1415  azithromycin (ZITHROMAX) 500 mg in sodium chloride 0.9 % 250 mL IVPB  Status:  Discontinued        500 mg 250 mL/hr over 60 Minutes Intravenous Every 24 hours 08/05/20 1407 08/05/20 1718              Family Communication/Anticipated D/C date and plan/Code Status   DVT prophylaxis:      Code Status: Partial Code  Family Communication: Private caregiver at the bedside Disposition Plan:    Status is: Inpatient  Remains inpatient appropriate because:Inpatient level of care appropriate due to severity of illness  Dispo: The patient is from: Home              Anticipated d/c is to: Home              Patient currently is not medically stable to d/c.   Difficult to place patient No           Subjective:   C/o pain in the left hand.  Pain in the left side of the chest is okay.  Private caregiver was at the bedside.   Objective:    Vitals:   08/06/20 1945 08/07/20 0322 08/07/20 0927 08/07/20 1101  BP: (!) 162/86 (!) 170/88 (!) 147/90 (!) 148/67  Pulse: 82 99 97 99  Resp: 16 16 15 17   Temp: 98 F (36.7 C) 98.2 F (36.8 C) 97.9 F (36.6 C) (!) 97.3 F (36.3 C)  TempSrc:  Oral Oral Oral  SpO2: 97% 96% 93% 97%  Weight:      Height:       No data found.   Intake/Output Summary (Last 24 hours) at 08/07/2020 1210 Last data filed at 08/07/2020 1043 Gross per 24 hour  Intake 2700 ml  Output 1700 ml  Net 1000 ml   Filed Weights   08/05/20 1354  Weight: 68.4 kg    Exam:   GEN: NAD SKIN: No rash EYES: EOMI ENT: MMM CV: RRR PULM: CTA B ABD: soft, ND, suprapubic tenderness, +BS CNS: AAO x 3, non focal EXT: No edema or tenderness MSK: Left chest wall tenderness.  No tenderness, edema or erythema in the  upper or lower extremities.         Data Reviewed:   I have personally reviewed following labs and imaging studies:  Labs: Labs show the following:   Basic Metabolic Panel: Recent Labs  Lab 08/05/20 1350 08/06/20 0327 08/07/20 0540  NA 132* 135 135  K 3.6 4.5 3.1*  CL 102 102 100  CO2 22 27 26   GLUCOSE 167* 117* 115*  BUN 26* 20 12  CREATININE 1.26* 0.98 0.64  CALCIUM 8.6* 8.6* 8.7*  MG  --   --  1.7   GFR Estimated Creatinine Clearance: 46.2 mL/min (by C-G formula based on SCr of 0.64 mg/dL). Liver Function Tests: Recent Labs  Lab 08/05/20 1350  AST 23  ALT 10  ALKPHOS 69  BILITOT 0.8  PROT 6.3*  ALBUMIN 3.4*   No results for input(s): LIPASE, AMYLASE in the last 168 hours. No results for input(s): AMMONIA in the last 168 hours. Coagulation profile Recent Labs  Lab 08/05/20 1350  INR 1.1    CBC: Recent Labs  Lab 08/05/20 1350 08/06/20 0327 08/07/20 0540  WBC 17.1* 13.8* 10.6*  NEUTROABS  --   --  8.1*  HGB 12.2 11.4* 12.0  HCT 36.9 35.1* 35.4*  MCV 87.9 88.9 85.3  PLT 246 230 251   Cardiac Enzymes: No results for input(s): CKTOTAL, CKMB, CKMBINDEX, TROPONINI in the last 168 hours. BNP (last 3 results) No results for input(s): PROBNP in the last 8760 hours. CBG: No results for input(s): GLUCAP in the last 168 hours. D-Dimer: No results for input(s): DDIMER in the last 72 hours. Hgb A1c: No results for input(s): HGBA1C in the last 72 hours. Lipid Profile: No results for input(s): CHOL, HDL, LDLCALC, TRIG, CHOLHDL, LDLDIRECT in the last 72 hours. Thyroid function studies: No results for input(s): TSH, T4TOTAL, T3FREE, THYROIDAB in the last 72 hours.  Invalid input(s): FREET3 Anemia work up: No results for input(s): VITAMINB12, FOLATE, FERRITIN, TIBC, IRON, RETICCTPCT in the last 72 hours. Sepsis Labs: Recent Labs  Lab 08/05/20 1350 08/05/20 1610 08/05/20 1845 08/05/20 2245 08/06/20 0327 08/07/20 0540  PROCALCITON  --   --   4.76  --   --   --   WBC 17.1*  --   --   --  13.8* 10.6*  LATICACIDVEN 2.7* 2.4* 2.5* 1.4  --   --     Microbiology Recent Results (from the past 240 hour(s))  Resp Panel by RT-PCR (Flu A&B, Covid) Nasopharyngeal Swab     Status: None   Collection Time: 08/05/20  2:05 PM   Specimen: Nasopharyngeal Swab; Nasopharyngeal(NP) swabs in vial transport medium  Result Value Ref Range Status   SARS Coronavirus 2 by RT PCR NEGATIVE NEGATIVE Final    Comment: (NOTE) SARS-CoV-2 target nucleic acids are NOT DETECTED.  The SARS-CoV-2 RNA is generally detectable in upper respiratory specimens during the acute phase of infection. The lowest concentration of SARS-CoV-2 viral copies this assay can detect is 138 copies/mL. A negative result does not preclude SARS-Cov-2 infection and should not be used as the sole basis for treatment or other patient management decisions. A negative result may occur with  improper specimen collection/handling, submission of specimen other than nasopharyngeal swab, presence of viral mutation(s) within the areas targeted by this assay, and inadequate number of viral copies(<138 copies/mL). A negative result must be combined with clinical observations, patient history, and epidemiological information. The expected result is Negative.  Fact Sheet for Patients:  EntrepreneurPulse.com.au  Fact Sheet for Healthcare Providers:  IncredibleEmployment.be  This test is no t yet approved or cleared by the [place] FDA and  has been authorized for detection and/or  diagnosis of SARS-CoV-2 by FDA under an Emergency Use Authorization (EUA). This EUA will remain  in effect (meaning this test can be used) for the duration of the COVID-19 declaration under Section 564(b)(1) of the Act, 21 U.S.C.section 360bbb-3(b)(1), unless the authorization is terminated  or revoked sooner.       Influenza A by PCR NEGATIVE NEGATIVE Final   Influenza B  by PCR NEGATIVE NEGATIVE Final    Comment: (NOTE) The Xpert Xpress SARS-CoV-2/FLU/RSV plus assay is intended as an aid in the diagnosis of influenza from Nasopharyngeal swab specimens and should not be used as a sole basis for treatment. Nasal washings and aspirates are unacceptable for Xpert Xpress SARS-CoV-2/FLU/RSV testing.  Fact Sheet for Patients: EntrepreneurPulse.com.au  Fact Sheet for Healthcare Providers: IncredibleEmployment.be  This test is not yet approved or cleared by the [place] FDA and has been authorized for detection and/or diagnosis of SARS-CoV-2 by FDA under an Emergency Use Authorization (EUA). This EUA will remain in effect (meaning this test can be used) for the duration of the COVID-19 declaration under Section 564(b)(1) of the Act, 21 U.S.C. section 360bbb-3(b)(1), unless the authorization is terminated or revoked.  Performed at [facility], [address] [place], [place] [address]   Blood Culture (routine x 2)     Status: None (Preliminary result)   Collection Time: 08/05/20  2:26 PM   Specimen: BLOOD  Result Value Ref Range Status   Specimen Description BLOOD RIGHT ANTECUBITAL  Final   Special Requests   Final    BOTTLES DRAWN AEROBIC AND ANAEROBIC Blood Culture adequate volume   Culture   Final    NO GROWTH 2 DAYS Performed at [facility], [address], [place], [place] [address]    Report Status PENDING  Incomplete  Urine culture     Status: Abnormal (Preliminary result)   Collection Time: 08/05/20  3:16 PM   Specimen: Urine, Random  Result Value Ref Range Status   Specimen Description   Final    URINE, RANDOM Performed at [facility], [address] [place], [place] [address]    Special Requests   Final    NONE Performed at [facility], [address] [place], [place] [address]    Culture >=100,000 COLONIES/mL ESCHERICHIA COLI (A)  Final   Report  Status PENDING  Incomplete  Blood Culture (routine x 2)     Status: None (Preliminary result)   Collection Time: 08/05/20  4:03 PM   Specimen: BLOOD  Result Value Ref Range Status   Specimen Description BLOOD BLOOD RIGHT HAND  Final   Special Requests   Final    BOTTLES DRAWN AEROBIC ONLY Blood Culture results may not be optimal due to an inadequate volume of blood received in culture bottles   Culture   Final    NO GROWTH 2 DAYS Performed at [facility], [address] [place],  [address]    Report Status PENDING  Incomplete    Procedures and diagnostic studies:  CT Head Wo Contrast  Result Date: 08/05/2020 CLINICAL DATA:  Fall last Thursday lethargic rib fracture EXAM: CT HEAD WITHOUT CONTRAST CT CERVICAL SPINE WITHOUT CONTRAST TECHNIQUE: Multidetector CT imaging of the head and cervical spine was performed following the standard protocol without intravenous contrast. Multiplanar CT image reconstructions of the cervical spine were also generated. COMPARISON:  CT brain 04/19/2020, MRI 04/17/2020, CT 04/19/2020 FINDINGS: CT HEAD FINDINGS Brain: No acute territorial infarction, hemorrhage or intracranial mass. Mild atrophy.  Moderate white matter hypodensity consistent with chronic small vessel ischemic change. Probable chronic lacunar infarct in the right basal ganglia. Stable ventricle size. Vascular: No hyperdense vessels.  Carotid vascular calcification Skull: Normal. Negative for fracture or focal lesion. Sinuses/Orbits: No acute finding. Other: None. CT CERVICAL SPINE FINDINGS Alignment: No subluxation.  Facet alignment is maintained Skull base and vertebrae: Craniovertebral junction is intact. Cervical vertebral bodies demonstrate normal stature. Moderate compression fracture T1 involves both the superior and inferior endplates with approximately 40% loss of vertebral body height anteriorly. Fracture involves the left lamina and extends to the inferior facet as before. Mild  retropulsion of inferior vertebral body. Soft tissues and spinal canal: No prevertebral fluid or swelling. No visible canal hematoma. Disc levels: Moderate degenerative osteophytes with relatively patent disc spaces. Hypertrophic facet degenerative changes at multiple levels. Upper chest: Negative. Other: None IMPRESSION: 1. No CT evidence for acute intracranial abnormality. Atrophy and chronic small vessel ischemic change of the white matter 2. No acute osseous abnormality of the cervical spine. Moderate compression deformity of T1 vertebral, grossly unchanged as compared with CT from March 2022. Electronically Signed   By: Donavan Foil M.D.   On: 08/05/2020 15:28   CT CHEST WO CONTRAST  Result Date: 08/05/2020 CLINICAL DATA:  Fall history of rib fracture EXAM: CT CHEST WITHOUT CONTRAST TECHNIQUE: Multidetector CT imaging of the chest was performed following the standard protocol without IV contrast. COMPARISON:  Radiographs 08/04/2020, CT 04/19/2020 FINDINGS: Cardiovascular: Limited evaluation without intravenous contrast. Moderate aortic atherosclerosis. No aneurysm. Coronary vascular calcification. Borderline to mild cardiomegaly. No pericardial effusion. Mediastinum/Nodes: Midline trachea. No suspicious thyroid mass. Borderline prevascular lymph node measuring 9 mm. Right precarinal lymph node measuring 10 mm. Large hiatal hernia. Lungs/Pleura: Trace left effusion. No pneumothorax. Partial atelectasis at the lower lobes. Scattered hazy pulmonary densities which may be due to atelectasis or small airways disease. Upper Abdomen: No acute abnormality. Musculoskeletal: Sternum is intact. Moderate compression deformity of T1. Moderate severe compression deformity of T8 with slight progressed loss of height diffusely of the vertebra. Minimal retropulsion inferior aspect of vertebral body. Subacute to chronic right fourth, 6, and seventh anterior to anterolateral rib fractures. Subacute to chronic right eighth  posterior rib fracture. Acute appearing mildly displaced left sixth and seventh posterior rib fractures. IMPRESSION: 1. Trace left pleural effusion. Partial atelectasis in the lower lobes. No pneumothorax. 2. Acute appearing mildly displaced left sixth and seventh posterior rib fractures. Multiple subacute to chronic right rib fractures. 3. Large hiatal hernia 4. Moderate compression deformity T1 with moderate severe compression deformity of T8. T1 deformity grossly unchanged as compared with 04/19/2020, slight progression in loss of height diffusely of T8 vertebral body since 04/19/2020 Aortic Atherosclerosis (ICD10-I70.0). Electronically Signed   By: Donavan Foil M.D.   On: 08/05/2020 15:39   CT Cervical Spine Wo Contrast  Result Date: 08/05/2020 CLINICAL DATA:  Fall last Thursday lethargic rib fracture EXAM: CT HEAD WITHOUT CONTRAST CT CERVICAL SPINE WITHOUT CONTRAST TECHNIQUE: Multidetector CT imaging of the head and cervical spine was performed following the standard protocol without intravenous contrast. Multiplanar CT image reconstructions of the cervical spine were also generated. COMPARISON:  CT brain 04/19/2020, MRI 04/17/2020, CT 04/19/2020 FINDINGS: CT HEAD FINDINGS Brain: No acute territorial infarction, hemorrhage or intracranial mass. Mild atrophy. Moderate white matter hypodensity consistent with chronic small vessel ischemic change. Probable chronic lacunar infarct in the right basal ganglia. Stable ventricle size. Vascular: No hyperdense vessels.  Carotid vascular calcification Skull: Normal. Negative for fracture or  focal lesion. Sinuses/Orbits: No acute finding. Other: None. CT CERVICAL SPINE FINDINGS Alignment: No subluxation.  Facet alignment is maintained Skull base and vertebrae: Craniovertebral junction is intact. Cervical vertebral bodies demonstrate normal stature. Moderate compression fracture T1 involves both the superior and inferior endplates with approximately 40% loss of  vertebral body height anteriorly. Fracture involves the left lamina and extends to the inferior facet as before. Mild retropulsion of inferior vertebral body. Soft tissues and spinal canal: No prevertebral fluid or swelling. No visible canal hematoma. Disc levels: Moderate degenerative osteophytes with relatively patent disc spaces. Hypertrophic facet degenerative changes at multiple levels. Upper chest: Negative. Other: None IMPRESSION: 1. No CT evidence for acute intracranial abnormality. Atrophy and chronic small vessel ischemic change of the white matter 2. No acute osseous abnormality of the cervical spine. Moderate compression deformity of T1 vertebral, grossly unchanged as compared with CT from March 2022. Electronically Signed   By: Donavan Foil M.D.   On: 08/05/2020 15:28   DG Chest Portable 1 View  Result Date: 08/05/2020 CLINICAL DATA:  Altered mental status EXAM: PORTABLE CHEST 1 VIEW COMPARISON:  08/04/2020 FINDINGS: Enlarged cardiomediastinal silhouette with trace left effusion and hazy atelectasis at the bases. Enlarged mediastinum likely due to portable technique and rotation. Mild vascular congestion. No pneumothorax. Large hiatal hernia IMPRESSION: 1. Enlarged cardiomediastinal silhouette with slight vascular congestion 2. Trace left effusion with hazy atelectasis at the bases 3. Large hiatal hernia Electronically Signed   By: Donavan Foil M.D.   On: 08/05/2020 15:40               LOS: 2 days   Marquice Uddin  [facility]   Pager on [email or web address]. If 7PM-7AM, please contact night-coverage at [email or web address]     08/07/2020, 12:10 PM

## 2020-08-08 DIAGNOSIS — N3000 Acute cystitis without hematuria: Secondary | ICD-10-CM

## 2020-08-08 LAB — URINE CULTURE: Culture: >=100000 — AB

## 2020-08-08 LAB — BASIC METABOLIC PANEL
Anion gap: 7 (ref 5–15)
BUN: 12 mg/dL (ref 8–23)
CO2: 27 mmol/L (ref 22–32)
Calcium: 8.8 mg/dL — ABNORMAL LOW (ref 8.9–10.3)
Chloride: 101 mmol/L (ref 98–111)
Creatinine, Ser: 0.72 mg/dL (ref 0.44–1.00)
GFR, Estimated: >60 mL/min (ref >60)
Glucose, Bld: 127 mg/dL — ABNORMAL HIGH (ref 70–99)
Potassium: 3.8 mmol/L (ref 3.5–5.1)
Sodium: 135 mmol/L (ref 135–145)

## 2020-08-08 LAB — LEGIONELLA PNEUMOPHILA SEROGP 1 UR AG: L. pneumophila Serogp 1 Ur Ag: NEGATIVE

## 2020-08-08 LAB — MAGNESIUM: Magnesium: 1.9 mg/dL (ref 1.7–2.4)

## 2020-08-08 MED ORDER — LEVOFLOXACIN 500 MG PO TABS
500.0000 mg | ORAL_TABLET | Freq: Every day | ORAL | Status: DC
  Administered 2020-08-08 – 2020-08-10 (×3): 500 mg via ORAL
  Filled 2020-08-08 (×3): qty 1

## 2020-08-08 NOTE — Care Management Important Message (Signed)
Important Message  Patient Details  Name: Alexandria Roman MRN: [id number] Date of Birth: 04-08-1929   Medicare Important Message Given:  Yes     Dannette Barbara 08/08/2020, 12:06 PM

## 2020-08-08 NOTE — Progress Notes (Signed)
Mobility Specialist - Progress Note   08/08/20 1200  Mobility  Activity Ambulated in hall  Level of Assistance Minimal assist, patient does 75% or more  Assistive Device Front wheel walker  Distance Ambulated (ft) 200 ft  Mobility Ambulated with assistance in hallway  Mobility Response Tolerated well  Mobility performed by Mobility specialist  $Mobility charge 1 Mobility    Pt sitting in recliner upon arrival. Pt hesitant, but then agreeable to session with encouragement from caregiver. Pt stood to RW with minA then proceeded to ambulate in hallway. Denied SOB on RA. Moderate dizziness during ambulation 5/10, but no LOB. Mildly unsteady with steering, but maneuvers around objects well. No pain with activity, except during last 5' (Left rib area). Pt left in chair with alarm set and lunch tray placed in reach, caregiver still present at exit.    Kathee Delton Mobility Specialist 08/08/20, 12:13 PM

## 2020-08-08 NOTE — TOC Initial Note (Addendum)
Transition of Care [facility]) - Initial/Assessment Note    Patient Details  Name: Alexandria Roman MRN: [id number] Date of Birth: 1929-11-01  Transition of [facility] CM/SW Contact:    Candie Chroman, LCSW Phone Number: 08/08/2020, 11:49 AM  Clinical Narrative:   Patient not fully oriented. CSW called one of patient's caregivers, Deneise Lever. CSW familiar with her from last admission. Notified Deneise Lever of DME recommendation for 3-in-1. She confirmed patient does not have one at home and thinks it would be beneficial for her. She reports patient is doing well at home and that MD is planning on discharging her tomorrow. Caregivers will take her home. [facility] has been updated. Asked MD to enter home health orders for PT, OT, aide, and social work and to enter a DME order for 3-in-1. No further concerns. CSW encouraged patient's caregiver to contact CSW as needed. CSW will continue to follow patient for support and facilitate return home tomorrow.       2:13 pm: Ordered 3-in-1 through [employer].     2:35 pm: [employer] needs to reach someone about payment for 3-in-1. Tried calling [employer] but his office is closed on Fridays according to voicemail. Left voicemail for son.      3:06 pm: [employer] will bill patient for 3-in-1. Son is aware. Left message for caregiver Deneise Lever to notify. Told son about plan to discharge tomorrow since unable to get in touch with [employer].  Expected Discharge Plan: [place] Barriers to Discharge: Continued Medical Work up   Patient Goals and CMS Choice Patient states their goals for this hospitalization and ongoing recovery are:: Patient not fully oriented.   Choice offered to / list presented to : NA  Expected Discharge Plan and Services Expected Discharge Plan: [place] Choice: Resumption of Svcs/PTA Provider Living arrangements for the past 2 months: [place]                                      Prior  Living Arrangements/Services Living arrangements for the past 2 months: [place] Lives with:: Self (Has caregivers) Patient language and need for interpreter reviewed:: Yes Do you feel safe going back to the place where you live?: Yes      Need for Family Participation in Patient Care: Yes (Comment) Care giver support system in place?: Yes (comment) Current home services: DME, Home OT, Home PT, Homehealth aide, Other (comment) (Home Education officer, museum) Criminal Activity/Legal Involvement Pertinent to Current Situation/Hospitalization: No - Comment as needed  Activities of Daily Living Home Assistive Devices/Equipment: Wheelchair, Shower chair with back, Raised toilet seat with rails, Grab bars around toilet, Grab bars in shower ADL Screening (condition at time of admission) Patient's cognitive ability adequate to safely complete daily activities?: No Is the patient deaf or have difficulty hearing?: No Does the patient have difficulty seeing, even when wearing glasses/contacts?: No Does the patient have difficulty concentrating, remembering, or making decisions?: No Patient able to express need for assistance with ADLs?: Yes Does the patient have difficulty dressing or bathing?: Yes Independently performs ADLs?: No Communication: Independent Dressing (OT): Needs assistance Is this a change from baseline?: Change from baseline, expected to last <3days Grooming: Needs assistance Is this a change from baseline?: Change from baseline, expected to last <3 days Feeding: Independent Bathing: Needs assistance Is this a change from baseline?: Change  from baseline, expected to last <3 days Toileting: Needs assistance Is this a change from baseline?: Change from baseline, expected to last <3 days In/Out Bed: Needs assistance Is this a change from baseline?: Change from baseline, expected to last <3 days Walks in Home: Needs assistance Is this a change from baseline?: Change from  baseline, expected to last <3 days Does the patient have difficulty walking or climbing stairs?: Yes Weakness of Legs: Both Weakness of Arms/Hands: None  Permission Sought/Granted Permission sought to share information with : Facility Sport and exercise psychologist, Other (comment)    Share Information with NAME: Deneise Lever  Permission granted to share info w AGENCY: [facility]  Permission granted to share info w Relationship: Caregiver  Permission granted to share info w Contact Information: [phone]  Emotional Assessment Appearance:: Appears stated age Attitude/Demeanor/Rapport: Unable to Assess Affect (typically observed): Unable to Assess Orientation: : Oriented to Self, Oriented to Place, Oriented to Situation Alcohol / Substance Use: Not Applicable Psych Involvement: No (comment)  Admission diagnosis:  UTI (urinary tract infection) [N39.0] Acute respiratory failure with hypoxia ([place]) [J96.01] HCAP (healthcare-associated pneumonia) [J18.9] Severe sepsis (HCC) [A41.9, R65.20] Closed fracture of multiple ribs of left side, initial encounter [S22.42XA] Altered mental status, unspecified altered mental status type [R41.82] Sepsis with encephalopathy without septic shock, due to unspecified organism ([place]) [A41.9, R65.20, G93.40] Patient Active Problem List   Diagnosis Date Noted   UTI (urinary tract infection) 08/05/2020   CKD (chronic kidney disease), stage IIIa 08/05/2020   Left rib fracture 37/62/8315   Acute metabolic encephalopathy 17/61/6073   Fall 08/05/2020   Acute respiratory failure with hypoxia ([place]) 08/05/2020   Abnormal EKG 08/05/2020   CAP (community acquired pneumonia) 08/05/2020   Severe sepsis ([place])    Cystitis    E. coli UTI    Chronic thoracic back pain    Sepsis due to gram-negative UTI ([place]) 06/22/2020   Nocturnal hypoxia    Weakness    Neuropathy    Glaucoma of both eyes    Thoracic compression fracture ([place]) 04/19/2020   Hip fracture ([place]) 05/19/2018    Depression with anxiety 12/09/2016   Numbness of foot 12/09/2016   Macular degeneration 12/09/2016   Gait instability 12/08/2016   Upper respiratory infection, viral 12/06/2016   Primary osteoarthritis of both knees 07/20/2016   Postmenopausal osteoporosis 04/15/2016   Chest pain 03/19/2016   Spinal stenosis 08/04/2015   Myalgia 06/11/2015   Polyarthralgia 06/11/2015   Chronic midline low back pain with bilateral sciatica 01/10/2015   Essential hypertension 06/04/2014   CKD (chronic kidney disease) stage 3, GFR 30-59 ml/min (HCC) 06/04/2014   Mixed hyperlipidemia 06/04/2014   Other allergic rhinitis 06/04/2014   DDD (degenerative disc disease), lumbar 09/07/2013   Lumbar radiculitis 09/07/2013   Hypothyroidism, unspecified 08/02/2013   PCP:  Adin Hector, MD Pharmacy:   [place], [place] [address] [place] [address] Phone: [phone] Fax: [phone]     Social Determinants of Health (SDOH) Interventions    Readmission Risk Interventions No flowsheet data found.

## 2020-08-08 NOTE — Plan of Care (Signed)
  Problem: Clinical Measurements: Goal: Respiratory complications will improve Outcome: Progressing   Problem: Activity: Goal: Risk for activity intolerance will decrease Outcome: Progressing   Problem: Nutrition: Goal: Adequate nutrition will be maintained Outcome: Progressing   Problem: Coping: Goal: Level of anxiety will decrease Outcome: Progressing   Problem: Pain Managment: Goal: General experience of comfort will improve Outcome: Progressing   Problem: Safety: Goal: Ability to remain free from injury will improve Outcome: Progressing   

## 2020-08-08 NOTE — Plan of Care (Signed)
  Problem: Clinical Measurements: Goal: Diagnostic test results will improve Outcome: Progressing   Problem: Activity: Goal: Risk for activity intolerance will decrease Outcome: Progressing   Problem: Nutrition: Goal: Adequate nutrition will be maintained Outcome: Progressing   Problem: Pain Managment: Goal: General experience of comfort will improve Outcome: Progressing   Problem: Safety: Goal: Ability to remain free from injury will improve Outcome: Progressing   Problem: Skin Integrity: Goal: Risk for impaired skin integrity will decrease Outcome: Progressing

## 2020-08-08 NOTE — Progress Notes (Signed)
SLP Cancellation Note  Patient Details Name: Alexandria Roman MRN: [id number] DOB: Apr 16, 1929   Cancelled treatment:       Reason Eval/Treat Not Completed: Other (comment) (met w/ pt's caregiver for more Handouts today) Per Caregiver request, further discussion on general diet consistency, food options, and Reflux precautions given. Handouts provided on suggested foods for Esophageal dysmotility and Reflux. Caregiver appreciative. No further needs indicated currently.      Orinda Kenner, MS, CCC-SLP Speech Language Pathologist Rehab Services [phone] [facility] 08/08/2020, 3:19 PM

## 2020-08-08 NOTE — Progress Notes (Addendum)
Progress Note    Alexandria Roman  TGG:[id number] DOB: 1929/06/16  DOA: 08/05/2020 PCP: Adin Hector, MD      Brief Narrative:    Medical records reviewed and are as summarized below:  Alexandria Roman is a 87 y.o. female with medical history significant of hypertension, hyperlipidemia, hypothyroidism, depression with anxiety, chronic back pain, CKD stage III, UTI, recent hospitalization from 5/8 through 06/26/2020 for sepsis secondary to UTI and E. coli bacteremia.  She presented to the hospital with fall, cough, chest wall pain, lethargy and confusion.  She was hypoxic with oxygen saturation of 85% on room air, tachypneic and tachycardic in the emergency room.  Lactic acid was 2.7.   She was admitted to the hospital for severe sepsis secondary to community-acquired pneumonia complicated by acute hypoxic respiratory failure, AKI and acute metabolic encephalopathy.  She was also found to have left rib fracture.     Assessment/Plan:   Principal Problem:   Acute respiratory failure with hypoxia (HCC) Active Problems:   Essential hypertension   Hypothyroidism, unspecified   Depression with anxiety   Severe sepsis (HCC)   Chronic thoracic back pain   UTI (urinary tract infection)   CKD (chronic kidney disease), stage IIIa   Left rib fracture   Acute metabolic encephalopathy   Fall   Abnormal EKG   CAP (community acquired pneumonia)    Body mass index is 25.88 kg/m.  Severe sepsis secondary to community-acquired pneumonia, probable acute UTI: Urine culture grew E. coli.  Previous urine culture from 06/22/2020 also showed E. coli.  Discontinue IV cefepime.  She was discharged on [place] on 06/26/2020.  Given that she still has E. coli in her urine, a different oral antibiotic will be used.  Start oral Levaquin.  Of note, patient has moxifloxacin listed on her allergy list.  She does not remember exactly what happened after taking moxifloxacin or why it is listed as an  allergy.  It was decided that patient will be started on oral Levaquin in the hospital.  She will be monitored overnight.  If she does not have any allergic reaction to Levaquin, then she will be discharged on oral Levaquin tomorrow.  Of note, she also has sulfa listed as an allergy.  She has tolerated cephalosporins thus far.  She was discharged on [place] on 06/26/2020.  Case was discussed with Barbaraann Rondo, pharmacist.  Plan was also discussed with her to private caregivers at the bedside and they have been asked to be on the look out for any allergic reaction that may okay.  Misty, RN, has also been notified.  Mildly elevated troponin, abnormal EKG with T wave inversions in leads V1, V2, I and aVL: This is likely due to sepsis.  Repeat EKG on 08/06/2020 showed T wave inversions in leads I and aVL.  Left fifth rib fracture: Analgesics as needed for pain  S/p fall: PT and OT evaluation.  AKI, acute hypoxic respiratory failure, acute metabolic encephalopathy, hypokalemia and hyponatremia: Resolved  Large hiatal hernia: Patient and caregiver have been educated measures to reduce risk of aspiration.  Outpatient follow-up with GI.   Diet Order             Diet Heart Room service appropriate? Yes with Assist; Fluid consistency: Thin  Diet effective now                      Consultants: None  Procedures: None    Medications:  DULoxetine  30 mg Oral BID   enoxaparin (LOVENOX) injection  40 mg Subcutaneous Q24H   gabapentin  400 mg Oral QHS   latanoprost  1 drop Both Eyes QHS   levofloxacin  500 mg Oral Daily   levothyroxine  75 mcg Oral Daily   LORazepam  0.25 mg Oral Daily   multivitamin-lutein  1 capsule Oral BID   timolol  1 drop Both Eyes Daily   Continuous Infusions:  sodium chloride Stopped (08/07/20 2304)     Anti-infectives (From admission, onward)    Start     Dose/Rate Route Frequency Ordered Stop   08/08/20 1145  levofloxacin (LEVAQUIN) tablet 500 mg         500 mg Oral Daily 08/08/20 1056     08/07/20 2000  vancomycin (VANCOREADY) IVPB 1250 mg/250 mL  Status:  Discontinued        1,250 mg 166.7 mL/hr over 90 Minutes Intravenous Every 48 hours 08/05/20 1803 08/06/20 1510   08/06/20 2200  ceFEPIme (MAXIPIME) 2 g in sodium chloride 0.9 % 100 mL IVPB  Status:  Discontinued        2 g 200 mL/hr over 30 Minutes Intravenous Every 12 hours 08/06/20 1512 08/08/20 1057   08/06/20 2000  vancomycin (VANCOREADY) IVPB 750 mg/150 mL  Status:  Discontinued        750 mg 150 mL/hr over 60 Minutes Intravenous Every 24 hours 08/06/20 1510 08/07/20 1458   08/05/20 1800  vancomycin (VANCOREADY) IVPB 1500 mg/300 mL        1,500 mg 150 mL/hr over 120 Minutes Intravenous  Once 08/05/20 1758 08/05/20 2230   08/05/20 1800  ceFEPIme (MAXIPIME) 2 g in sodium chloride 0.9 % 100 mL IVPB  Status:  Discontinued        2 g 200 mL/hr over 30 Minutes Intravenous Every 24 hours 08/05/20 1758 08/06/20 1512   08/05/20 1415  cefTRIAXone (ROCEPHIN) 2 g in sodium chloride 0.9 % 100 mL IVPB  Status:  Discontinued        2 g 200 mL/hr over 30 Minutes Intravenous Every 24 hours 08/05/20 1407 08/05/20 1718   08/05/20 1415  azithromycin (ZITHROMAX) 500 mg in sodium chloride 0.9 % 250 mL IVPB  Status:  Discontinued        500 mg 250 mL/hr over 60 Minutes Intravenous Every 24 hours 08/05/20 1407 08/05/20 1718              Family Communication/Anticipated D/C date and plan/Code Status   DVT prophylaxis: enoxaparin (LOVENOX) injection 40 mg Start: 08/07/20 1800     Code Status: Partial Code  Family Communication: 2 private caregivers including Vicente Males, owner of the home health agency at the bedside Disposition Plan:    Status is: Inpatient  Remains inpatient appropriate because:Inpatient level of care appropriate due to severity of illness  Dispo: The patient is from: Home              Anticipated d/c is to: Home              Patient currently is not medically stable to  d/c.   Difficult to place patient No           Subjective:   C/o pain in the left hand.  Pain in the left side of the chest is okay.  Private caregiver was at the bedside.   Objective:    Vitals:   08/07/20 2023 08/07/20 2308 08/08/20 0412 08/08/20 0718  BP: (!) 175/82 Marland Kitchen)  154/82 (!) 170/100 (!) 156/78  Pulse: 84 84 88 89  Resp: 18 16 15 18   Temp: 98.3 F (36.8 C) 98.1 F (36.7 C) 97.7 F (36.5 C) 97.8 F (36.6 C)  TempSrc:    Oral  SpO2: 97% 93% 94% 91%  Weight:      Height:       No data found.   Intake/Output Summary (Last 24 hours) at 08/08/2020 1103 Last data filed at 08/08/2020 0900 Gross per 24 hour  Intake 602.51 ml  Output 2800 ml  Net -2197.49 ml   Filed Weights   08/05/20 1354  Weight: 68.4 kg    Exam:   GEN: NAD SKIN: No rash EYES: EOMI ENT: MMM CV: RRR PULM: CTA B ABD: soft, ND, suprapubic tenderness, +BS CNS: AAO x 3, non focal EXT: No edema or tenderness MSK: Left chest wall tenderness.  No tenderness, edema or erythema in the upper or lower extremities.         Data Reviewed:   I have personally reviewed following labs and imaging studies:  Labs: Labs show the following:   Basic Metabolic Panel: Recent Labs  Lab 08/05/20 1350 08/06/20 0327 08/07/20 0540 08/08/20 0655  NA 132* 135 135 135  K 3.6 4.5 3.1* 3.8  CL 102 102 100 101  CO2 22 27 26 27   GLUCOSE 167* 117* 115* 127*  BUN 26* 20 12 12   CREATININE 1.26* 0.98 0.64 0.72  CALCIUM 8.6* 8.6* 8.7* 8.8*  MG  --   --  1.7 1.9   GFR Estimated Creatinine Clearance: 46.2 mL/min (by C-G formula based on SCr of 0.72 mg/dL). Liver Function Tests: Recent Labs  Lab 08/05/20 1350  AST 23  ALT 10  ALKPHOS 69  BILITOT 0.8  PROT 6.3*  ALBUMIN 3.4*   No results for input(s): LIPASE, AMYLASE in the last 168 hours. No results for input(s): AMMONIA in the last 168 hours. Coagulation profile Recent Labs  Lab 08/05/20 1350  INR 1.1    CBC: Recent Labs  Lab  08/05/20 1350 08/06/20 0327 08/07/20 0540  WBC 17.1* 13.8* 10.6*  NEUTROABS  --   --  8.1*  HGB 12.2 11.4* 12.0  HCT 36.9 35.1* 35.4*  MCV 87.9 88.9 85.3  PLT 246 230 251   Cardiac Enzymes: No results for input(s): CKTOTAL, CKMB, CKMBINDEX, TROPONINI in the last 168 hours. BNP (last 3 results) No results for input(s): PROBNP in the last 8760 hours. CBG: No results for input(s): GLUCAP in the last 168 hours. D-Dimer: No results for input(s): DDIMER in the last 72 hours. Hgb A1c: No results for input(s): HGBA1C in the last 72 hours. Lipid Profile: No results for input(s): CHOL, HDL, LDLCALC, TRIG, CHOLHDL, LDLDIRECT in the last 72 hours. Thyroid function studies: No results for input(s): TSH, T4TOTAL, T3FREE, THYROIDAB in the last 72 hours.  Invalid input(s): FREET3 Anemia work up: No results for input(s): VITAMINB12, FOLATE, FERRITIN, TIBC, IRON, RETICCTPCT in the last 72 hours. Sepsis Labs: Recent Labs  Lab 08/05/20 1350 08/05/20 1610 08/05/20 1845 08/05/20 2245 08/06/20 0327 08/07/20 0540  PROCALCITON  --   --  4.76  --   --   --   WBC 17.1*  --   --   --  13.8* 10.6*  LATICACIDVEN 2.7* 2.4* 2.5* 1.4  --   --     Microbiology Recent Results (from the past 240 hour(s))  Resp Panel by RT-PCR (Flu A&B, Covid) Nasopharyngeal Swab     Status: None  Collection Time: 08/05/20  2:05 PM   Specimen: Nasopharyngeal Swab; Nasopharyngeal(NP) swabs in vial transport medium  Result Value Ref Range Status   SARS Coronavirus 2 by RT PCR NEGATIVE NEGATIVE Final    Comment: (NOTE) SARS-CoV-2 target nucleic acids are NOT DETECTED.  The SARS-CoV-2 RNA is generally detectable in upper respiratory specimens during the acute phase of infection. The lowest concentration of SARS-CoV-2 viral copies this assay can detect is 138 copies/mL. A negative result does not preclude SARS-Cov-2 infection and should not be used as the sole basis for treatment or other patient management  decisions. A negative result may occur with  improper specimen collection/handling, submission of specimen other than nasopharyngeal swab, presence of viral mutation(s) within the areas targeted by this assay, and inadequate number of viral copies(<138 copies/mL). A negative result must be combined with clinical observations, patient history, and epidemiological information. The expected result is Negative.  Fact Sheet for Patients:  EntrepreneurPulse.com.au  Fact Sheet for Healthcare Providers:  IncredibleEmployment.be  This test is no t yet approved or cleared by the [place] FDA and  has been authorized for detection and/or diagnosis of SARS-CoV-2 by FDA under an Emergency Use Authorization (EUA). This EUA will remain  in effect (meaning this test can be used) for the duration of the COVID-19 declaration under Section 564(b)(1) of the Act, 21 U.S.C.section 360bbb-3(b)(1), unless the authorization is terminated  or revoked sooner.       Influenza A by PCR NEGATIVE NEGATIVE Final   Influenza B by PCR NEGATIVE NEGATIVE Final    Comment: (NOTE) The Xpert Xpress SARS-CoV-2/FLU/RSV plus assay is intended as an aid in the diagnosis of influenza from Nasopharyngeal swab specimens and should not be used as a sole basis for treatment. Nasal washings and aspirates are unacceptable for Xpert Xpress SARS-CoV-2/FLU/RSV testing.  Fact Sheet for Patients: EntrepreneurPulse.com.au  Fact Sheet for Healthcare Providers: IncredibleEmployment.be  This test is not yet approved or cleared by the [place] FDA and has been authorized for detection and/or diagnosis of SARS-CoV-2 by FDA under an Emergency Use Authorization (EUA). This EUA will remain in effect (meaning this test can be used) for the duration of the COVID-19 declaration under Section 564(b)(1) of the Act, 21 U.S.C. section 360bbb-3(b)(1), unless the  authorization is terminated or revoked.  Performed at [facility], [address] [place], [place] [address]   Blood Culture (routine x 2)     Status: None (Preliminary result)   Collection Time: 08/05/20  2:26 PM   Specimen: BLOOD  Result Value Ref Range Status   Specimen Description BLOOD RIGHT ANTECUBITAL  Final   Special Requests   Final    BOTTLES DRAWN AEROBIC AND ANAEROBIC Blood Culture adequate volume   Culture   Final    NO GROWTH 2 DAYS Performed at [facility], [address] [place], [place] [address]    Report Status PENDING  Incomplete  Urine culture     Status: Abnormal   Collection Time: 08/05/20  3:16 PM   Specimen: Urine, Random  Result Value Ref Range Status   Specimen Description   Final    URINE, RANDOM Performed at [facility], [address], [place], [place] [address]    Special Requests   Final    NONE Performed at [facility], [address] [place], [place] [address]    Culture >=100,000 COLONIES/mL ESCHERICHIA COLI (A)  Final   Report Status 08/08/2020 FINAL  Final   Organism ID,  Bacteria ESCHERICHIA COLI (A)  Final      Susceptibility   Escherichia coli - MIC*    AMPICILLIN >=32 RESISTANT Resistant     CEFAZOLIN <=4 SENSITIVE Sensitive     CEFEPIME <=0.12 SENSITIVE Sensitive     CEFTRIAXONE <=0.25 SENSITIVE Sensitive     CIPROFLOXACIN <=0.25 SENSITIVE Sensitive     GENTAMICIN <=1 SENSITIVE Sensitive     IMIPENEM <=0.25 SENSITIVE Sensitive     NITROFURANTOIN <=16 SENSITIVE Sensitive     TRIMETH/SULFA <=20 SENSITIVE Sensitive     AMPICILLIN/SULBACTAM >=32 RESISTANT Resistant     PIP/TAZO <=4 SENSITIVE Sensitive     * >=100,000 COLONIES/mL ESCHERICHIA COLI  Blood Culture (routine x 2)     Status: None (Preliminary result)   Collection Time: 08/05/20  4:03 PM   Specimen: BLOOD  Result Value Ref Range Status   Specimen Description BLOOD BLOOD RIGHT HAND  Final   Special Requests   Final     BOTTLES DRAWN AEROBIC ONLY Blood Culture results may not be optimal due to an inadequate volume of blood received in culture bottles   Culture   Final    NO GROWTH 2 DAYS Performed at [facility], [address], [place], [place] [address]    Report Status PENDING  Incomplete  MRSA PCR Screening     Status: None   Collection Time: 08/07/20  1:13 PM  Result Value Ref Range Status   MRSA by PCR NEGATIVE NEGATIVE Final    Comment:        The GeneXpert MRSA Assay (FDA approved for NASAL specimens only), is one component of a comprehensive MRSA colonization surveillance program. It is not intended to diagnose MRSA infection nor to guide or monitor treatment for MRSA infections. Performed at [facility], [address] [place], [place] [address]     Procedures and diagnostic studies:  No results found.             LOS: 3 days   Kalea Perine  [facility]   Pager on [email or web address]. If 7PM-7AM, please contact night-coverage at [email or web address]     08/08/2020, 11:03 AM

## 2020-08-09 MED ORDER — AMLODIPINE BESYLATE 5 MG PO TABS
5.0000 mg | ORAL_TABLET | Freq: Every day | ORAL | Status: DC
  Administered 2020-08-09: 5 mg via ORAL
  Filled 2020-08-09: qty 1

## 2020-08-09 MED ORDER — LEVOFLOXACIN 500 MG PO TABS: 500.0000 mg | ORAL_TABLET | Freq: Every day | ORAL | 0 refills | Status: DC

## 2020-08-09 MED ORDER — TRAZODONE HCL 50 MG PO TABS
25.0000 mg | ORAL_TABLET | Freq: Every evening | ORAL | Status: DC | PRN
  Administered 2020-08-09: 25 mg via ORAL
  Filled 2020-08-09: qty 1

## 2020-08-09 MED ORDER — AMLODIPINE BESYLATE 2.5 MG PO TABS: 2.5000 mg | ORAL_TABLET | Freq: Every day | ORAL | 0 refills | Status: DC

## 2020-08-09 MED ORDER — POLYETHYLENE GLYCOL 3350 17 G PO PACK: 17.0000 g | PACK | Freq: Every day | ORAL | Status: AC | PRN

## 2020-08-09 NOTE — Progress Notes (Signed)
Progress Note    Alexandria Roman  HKV:[id number] DOB: 27-Apr-1929  DOA: 08/05/2020 PCP: Adin Hector, MD      Brief Narrative:    Medical records reviewed and are as summarized below:  Alexandria Roman is a 87 y.o. female with medical history significant of hypertension, hyperlipidemia, hypothyroidism, depression with anxiety, chronic back pain, CKD stage III, UTI, recent hospitalization from 5/8 through 06/26/2020 for sepsis secondary to UTI and E. coli bacteremia.  She presented to the hospital with fall, cough, chest wall pain, lethargy and confusion.  She was hypoxic with oxygen saturation of 85% on room air, tachypneic and tachycardic in the emergency room.  Lactic acid was 2.7.   She was admitted to the hospital for severe sepsis secondary to community-acquired pneumonia complicated by acute hypoxic respiratory failure, AKI and acute metabolic encephalopathy.  She was also found to have left rib fracture.     Assessment/Plan:   Principal Problem:   Acute respiratory failure with hypoxia (HCC) Active Problems:   Essential hypertension   Hypothyroidism, unspecified   Depression with anxiety   Severe sepsis (HCC)   Chronic thoracic back pain   UTI (urinary tract infection)   CKD (chronic kidney disease), stage IIIa   Left rib fracture   Acute metabolic encephalopathy   Fall   Abnormal EKG   CAP (community acquired pneumonia)    Body mass index is 25.88 kg/m.  Severe sepsis secondary to community-acquired pneumonia, probable acute UTI: Urine culture grew E. coli.  Previous urine culture from 06/22/2020 also showed E. coli.  She has tolerated Levaquin thus far.  Continue Levaquin.  Hypertensive urgency: BP was 193/94 this morning.  This may have caused her dizziness.  She was started on amlodipine.  She also has IV hydralazine as needed for severe hypertension.   Mildly elevated troponin, abnormal EKG with T wave inversions in leads V1, V2, I and aVL: This is  likely due to sepsis.  Repeat EKG on 08/06/2020 showed T wave inversions in leads I and aVL.  Left fifth rib fracture: Analgesics as needed for pain  S/p fall: PT and OT recommend home health therapy  AKI, acute hypoxic respiratory failure, acute metabolic encephalopathy, hypokalemia and hyponatremia: Resolved  Large hiatal hernia: Patient and caregiver have been educated measures to reduce risk of aspiration.  Outpatient follow-up with GI.  Plan discussed with Alexandria Roman, private nurse, who takes care of the patient at home. She was concerned about patient going home today because of dizziness patient experienced earlier today. She was concerned that patient will end up in the hospital again if she was discharged today even though patent said she was feeling better than she did earlier in the day. Discharge has been canceled. She will be monitored overnight and discharged tomorrow if she's feeling much better.  Alexandria Roman also expressed concern that the patient had previously been taking amlodipine but this was discontinued because her blood pressure was running low.     Diet Order             Diet - low sodium heart healthy           Diet Heart Room service appropriate? Yes with Assist; Fluid consistency: Thin  Diet effective now                      Consultants: None  Procedures: None    Medications:    amLODipine  5 mg  Oral Daily   DULoxetine  30 mg Oral BID   enoxaparin (LOVENOX) injection  40 mg Subcutaneous Q24H   gabapentin  400 mg Oral QHS   latanoprost  1 drop Both Eyes QHS   levofloxacin  500 mg Oral Daily   levothyroxine  75 mcg Oral Daily   LORazepam  0.25 mg Oral Daily   multivitamin-lutein  1 capsule Oral BID   timolol  1 drop Both Eyes Daily   Continuous Infusions:  sodium chloride Stopped (08/07/20 2304)     Anti-infectives (From admission, onward)    Start     Dose/Rate Route Frequency Ordered Stop   08/10/20 0000  levofloxacin  (LEVAQUIN) 500 MG tablet       Note to Pharmacy: Patient tolerated Levaquin in the hospital   500 mg Oral Daily 08/09/20 1544 08/12/20 2359   08/08/20 1200  levofloxacin (LEVAQUIN) tablet 500 mg        500 mg Oral Daily 08/08/20 1056     08/07/20 2000  vancomycin (VANCOREADY) IVPB 1250 mg/250 mL  Status:  Discontinued        1,250 mg 166.7 mL/hr over 90 Minutes Intravenous Every 48 hours 08/05/20 1803 08/06/20 1510   08/06/20 2200  ceFEPIme (MAXIPIME) 2 g in sodium chloride 0.9 % 100 mL IVPB  Status:  Discontinued        2 g 200 mL/hr over 30 Minutes Intravenous Every 12 hours 08/06/20 1512 08/08/20 1057   08/06/20 2000  vancomycin (VANCOREADY) IVPB 750 mg/150 mL  Status:  Discontinued        750 mg 150 mL/hr over 60 Minutes Intravenous Every 24 hours 08/06/20 1510 08/07/20 1458   08/05/20 1800  vancomycin (VANCOREADY) IVPB 1500 mg/300 mL        1,500 mg 150 mL/hr over 120 Minutes Intravenous  Once 08/05/20 1758 08/05/20 2230   08/05/20 1800  ceFEPIme (MAXIPIME) 2 g in sodium chloride 0.9 % 100 mL IVPB  Status:  Discontinued        2 g 200 mL/hr over 30 Minutes Intravenous Every 24 hours 08/05/20 1758 08/06/20 1512   08/05/20 1415  cefTRIAXone (ROCEPHIN) 2 g in sodium chloride 0.9 % 100 mL IVPB  Status:  Discontinued        2 g 200 mL/hr over 30 Minutes Intravenous Every 24 hours 08/05/20 1407 08/05/20 1718   08/05/20 1415  azithromycin (ZITHROMAX) 500 mg in sodium chloride 0.9 % 250 mL IVPB  Status:  Discontinued        500 mg 250 mL/hr over 60 Minutes Intravenous Every 24 hours 08/05/20 1407 08/05/20 1718              Family Communication/Anticipated D/C date and plan/Code Status   DVT prophylaxis: enoxaparin (LOVENOX) injection 40 mg Start: 08/07/20 1800     Code Status: Partial Code  Family Communication: 2 private caregivers including Vicente Males, owner of the home health agency at the bedside Disposition Plan:    Status is: Inpatient  Remains inpatient appropriate  because:Inpatient level of care appropriate due to severity of illness  Dispo: The patient is from: Home              Anticipated d/c is to: Home              Patient currently is not medically stable to d/c.   Difficult to place patient No           Subjective:   C/o dizziness and "feeling strange". Her  private caregiver is at the bedside   Objective:    Vitals:   08/09/20 0943 08/09/20 1129 08/09/20 1250 08/09/20 1555  BP: (!) 131/56 108/72 133/70 (!) 142/76  Pulse: (!) 55 92 88 85  Resp: 16 16 17 18   Temp: 97.7 F (36.5 C) 98.8 F (37.1 C) 97.9 F (36.6 C) 97.9 F (36.6 C)  TempSrc: Oral Oral Oral Oral  SpO2: 92% 96% 97% 97%  Weight:      Height:       No data found.   Intake/Output Summary (Last 24 hours) at 08/09/2020 1607 Last data filed at 08/09/2020 1000 Gross per 24 hour  Intake 120 ml  Output --  Net 120 ml   Filed Weights   08/05/20 1354  Weight: 68.4 kg    Exam:  GEN: NAD SKIN: Warm and dry EYES: EOMI ENT: MMM CV: RRR PULM: CTA B ABD: soft, ND, NT, +BS CNS: AAO x 3, non focal EXT: No edema or tenderness MSK: Left chest wall tenderness           Data Reviewed:   I have personally reviewed following labs and imaging studies:  Labs: Labs show the following:   Basic Metabolic Panel: Recent Labs  Lab 08/05/20 1350 08/06/20 0327 08/07/20 0540 08/08/20 0655  NA 132* 135 135 135  K 3.6 4.5 3.1* 3.8  CL 102 102 100 101  CO2 22 27 26 27   GLUCOSE 167* 117* 115* 127*  BUN 26* 20 12 12   CREATININE 1.26* 0.98 0.64 0.72  CALCIUM 8.6* 8.6* 8.7* 8.8*  MG  --   --  1.7 1.9   GFR Estimated Creatinine Clearance: 46.2 mL/min (by C-G formula based on SCr of 0.72 mg/dL). Liver Function Tests: Recent Labs  Lab 08/05/20 1350  AST 23  ALT 10  ALKPHOS 69  BILITOT 0.8  PROT 6.3*  ALBUMIN 3.4*   No results for input(s): LIPASE, AMYLASE in the last 168 hours. No results for input(s): AMMONIA in the last 168  hours. Coagulation profile Recent Labs  Lab 08/05/20 1350  INR 1.1    CBC: Recent Labs  Lab 08/05/20 1350 08/06/20 0327 08/07/20 0540  WBC 17.1* 13.8* 10.6*  NEUTROABS  --   --  8.1*  HGB 12.2 11.4* 12.0  HCT 36.9 35.1* 35.4*  MCV 87.9 88.9 85.3  PLT 246 230 251   Cardiac Enzymes: No results for input(s): CKTOTAL, CKMB, CKMBINDEX, TROPONINI in the last 168 hours. BNP (last 3 results) No results for input(s): PROBNP in the last 8760 hours. CBG: No results for input(s): GLUCAP in the last 168 hours. D-Dimer: No results for input(s): DDIMER in the last 72 hours. Hgb A1c: No results for input(s): HGBA1C in the last 72 hours. Lipid Profile: No results for input(s): CHOL, HDL, LDLCALC, TRIG, CHOLHDL, LDLDIRECT in the last 72 hours. Thyroid function studies: No results for input(s): TSH, T4TOTAL, T3FREE, THYROIDAB in the last 72 hours.  Invalid input(s): FREET3 Anemia work up: No results for input(s): VITAMINB12, FOLATE, FERRITIN, TIBC, IRON, RETICCTPCT in the last 72 hours. Sepsis Labs: Recent Labs  Lab 08/05/20 1350 08/05/20 1610 08/05/20 1845 08/05/20 2245 08/06/20 0327 08/07/20 0540  PROCALCITON  --   --  4.76  --   --   --   WBC 17.1*  --   --   --  13.8* 10.6*  LATICACIDVEN 2.7* 2.4* 2.5* 1.4  --   --     Microbiology Recent Results (from the past 240 hour(s))  Resp Panel by RT-PCR (Flu A&B, Covid) Nasopharyngeal Swab     Status: None   Collection Time: 08/05/20  2:05 PM   Specimen: Nasopharyngeal Swab; Nasopharyngeal(NP) swabs in vial transport medium  Result Value Ref Range Status   SARS Coronavirus 2 by RT PCR NEGATIVE NEGATIVE Final    Comment: (NOTE) SARS-CoV-2 target nucleic acids are NOT DETECTED.  The SARS-CoV-2 RNA is generally detectable in upper respiratory specimens during the acute phase of infection. The lowest concentration of SARS-CoV-2 viral copies this assay can detect is 138 copies/mL. A negative result does not preclude  SARS-Cov-2 infection and should not be used as the sole basis for treatment or other patient management decisions. A negative result may occur with  improper specimen collection/handling, submission of specimen other than nasopharyngeal swab, presence of viral mutation(s) within the areas targeted by this assay, and inadequate number of viral copies(<138 copies/mL). A negative result must be combined with clinical observations, patient history, and epidemiological information. The expected result is Negative.  Fact Sheet for Patients:  EntrepreneurPulse.com.au  Fact Sheet for Healthcare Providers:  IncredibleEmployment.be  This test is no t yet approved or cleared by the [place] FDA and  has been authorized for detection and/or diagnosis of SARS-CoV-2 by FDA under an Emergency Use Authorization (EUA). This EUA will remain  in effect (meaning this test can be used) for the duration of the COVID-19 declaration under Section 564(b)(1) of the Act, 21 U.S.C.section 360bbb-3(b)(1), unless the authorization is terminated  or revoked sooner.       Influenza A by PCR NEGATIVE NEGATIVE Final   Influenza B by PCR NEGATIVE NEGATIVE Final    Comment: (NOTE) The Xpert Xpress SARS-CoV-2/FLU/RSV plus assay is intended as an aid in the diagnosis of influenza from Nasopharyngeal swab specimens and should not be used as a sole basis for treatment. Nasal washings and aspirates are unacceptable for Xpert Xpress SARS-CoV-2/FLU/RSV testing.  Fact Sheet for Patients: EntrepreneurPulse.com.au  Fact Sheet for Healthcare Providers: IncredibleEmployment.be  This test is not yet approved or cleared by the [place] FDA and has been authorized for detection and/or diagnosis of SARS-CoV-2 by FDA under an Emergency Use Authorization (EUA). This EUA will remain in effect (meaning this test can be used) for the duration of  the COVID-19 declaration under Section 564(b)(1) of the Act, 21 U.S.C. section 360bbb-3(b)(1), unless the authorization is terminated or revoked.  Performed at [facility], [address] [place], [place] [address]   Blood Culture (routine x 2)     Status: None (Preliminary result)   Collection Time: 08/05/20  2:26 PM   Specimen: BLOOD  Result Value Ref Range Status   Specimen Description BLOOD RIGHT ANTECUBITAL  Final   Special Requests   Final    BOTTLES DRAWN AEROBIC AND ANAEROBIC Blood Culture adequate volume   Culture   Final    NO GROWTH 4 DAYS Performed at [facility], [address], [place], [place] [address]    Report Status PENDING  Incomplete  Urine culture     Status: Abnormal   Collection Time: 08/05/20  3:16 PM   Specimen: Urine, Random  Result Value Ref Range Status   Specimen Description   Final    URINE, RANDOM Performed at [facility], [address], [place], [place] [address]    Special Requests   Final    NONE Performed at [facility], [address] [place], [place] [address]    Culture >=100,000 COLONIES/mL  ESCHERICHIA COLI (A)  Final   Report Status 08/08/2020 FINAL  Final   Organism ID, Bacteria ESCHERICHIA COLI (A)  Final      Susceptibility   Escherichia coli - MIC*    AMPICILLIN >=32 RESISTANT Resistant     CEFAZOLIN <=4 SENSITIVE Sensitive     CEFEPIME <=0.12 SENSITIVE Sensitive     CEFTRIAXONE <=0.25 SENSITIVE Sensitive     CIPROFLOXACIN <=0.25 SENSITIVE Sensitive     GENTAMICIN <=1 SENSITIVE Sensitive     IMIPENEM <=0.25 SENSITIVE Sensitive     NITROFURANTOIN <=16 SENSITIVE Sensitive     TRIMETH/SULFA <=20 SENSITIVE Sensitive     AMPICILLIN/SULBACTAM >=32 RESISTANT Resistant     PIP/TAZO <=4 SENSITIVE Sensitive     * >=100,000 COLONIES/mL ESCHERICHIA COLI  Blood Culture (routine x 2)     Status: None (Preliminary result)   Collection Time: 08/05/20  4:03 PM   Specimen: BLOOD  Result Value  Ref Range Status   Specimen Description BLOOD BLOOD RIGHT HAND  Final   Special Requests   Final    BOTTLES DRAWN AEROBIC ONLY Blood Culture results may not be optimal due to an inadequate volume of blood received in culture bottles   Culture   Final    NO GROWTH 4 DAYS Performed at [facility], [address], [place], [place] [address]    Report Status PENDING  Incomplete  MRSA PCR Screening     Status: None   Collection Time: 08/07/20  1:13 PM  Result Value Ref Range Status   MRSA by PCR NEGATIVE NEGATIVE Final    Comment:        The GeneXpert MRSA Assay (FDA approved for NASAL specimens only), is one component of a comprehensive MRSA colonization surveillance program. It is not intended to diagnose MRSA infection nor to guide or monitor treatment for MRSA infections. Performed at [facility], [address] [place], [place] [address]     Procedures and diagnostic studies:  No results found.             LOS: 4 days   Amaryllis Malmquist  [facility]   Pager on [email or web address]. If 7PM-7AM, please contact night-coverage at [email or web address]     08/09/2020, 4:07 PM

## 2020-08-09 NOTE — Plan of Care (Signed)
  Problem: Health Behavior/Discharge Planning: Goal: Ability to manage health-related needs will improve Outcome: Progressing   Problem: Clinical Measurements: Goal: Ability to maintain clinical measurements within normal limits will improve Outcome: Progressing   Problem: Activity: Goal: Risk for activity intolerance will decrease Outcome: Progressing   Problem: Nutrition: Goal: Adequate nutrition will be maintained Outcome: Progressing   Problem: Elimination: Goal: Will not experience complications related to urinary retention Outcome: Progressing   Problem: Pain Managment: Goal: General experience of comfort will improve Outcome: Progressing   Problem: Safety: Goal: Ability to remain free from injury will improve Outcome: Progressing

## 2020-08-10 LAB — CULTURE, BLOOD (ROUTINE X 2)
Culture: NO GROWTH
Culture: NO GROWTH
Special Requests: ADEQUATE

## 2020-08-10 MED ORDER — LEVOFLOXACIN 500 MG PO TABS: 500.0000 mg | ORAL_TABLET | Freq: Every day | ORAL | 0 refills | Status: AC

## 2020-08-10 NOTE — TOC Transition Note (Signed)
Transition of Care [facility]) - CM/SW Discharge Note   Patient Details  Name: Alexandria Roman MRN: [id number] Date of Birth: 05/04/1929  Transition of Care [facility] CM/SW Contact:  Beverly Sessions, RN Phone Number: 08/10/2020, 2:55 PM   Clinical Narrative:     Patient to discharge today Caregiver is at bedside and going to transport Daughter in law notified of discharge  Attempted to call HPOA and update however office is closed Voicemail left for [facility] to notify of discharge BSC in the room for discharge   Final next level of care: [place] Barriers to Discharge: Continued Medical Work up   Patient Goals and CMS Choice Patient states their goals for this hospitalization and ongoing recovery are:: Patient not fully oriented.   Choice offered to / list presented to : NA  Discharge Placement                       Discharge Plan and Services     Post Acute Care Choice: Resumption of Svcs/PTA Provider                    HH Arranged: PT, OT, RN [facility] Agency: [facility]        Social Determinants of Health (SDOH) Interventions     Readmission Risk Interventions Readmission Risk Prevention Plan 08/10/2020  Transportation Screening Complete  HRI or [place] Complete  Social Work Consult for [place] Planning/Counseling Complete  Palliative Care Screening Complete  Medication Review Press photographer) Complete  Some recent data might be hidden

## 2020-08-10 NOTE — Discharge Summary (Signed)
Physician Discharge Summary  Alexandria Roman PXT:[id number] DOB: Jun 25, 1929 DOA: 08/05/2020  PCP: Adin Hector, MD  Admit date: 08/05/2020 Discharge date: 08/10/2020  Discharge disposition: Home   Recommendations for Outpatient Follow-Up:   Follow-up with PCP in 1 week   Discharge Diagnosis:   Principal Problem:   Acute respiratory failure with hypoxia [facility]) Active Problems:   Essential hypertension   Hypothyroidism, unspecified   Depression with anxiety   Severe sepsis (HCC)   Chronic thoracic back pain   UTI (urinary tract infection)   CKD (chronic kidney disease), stage IIIa   Left rib fracture   Acute metabolic encephalopathy   Fall   Abnormal EKG   CAP (community acquired pneumonia)    Discharge Condition: Stable.  Diet recommendation:  Diet Order             Diet - low sodium heart healthy           Diet Heart Room service appropriate? Yes with Assist; Fluid consistency: Thin  Diet effective now                     Code Status: Partial Code     Hospital Course:   Alexandria Roman is a 87 y.o. female with medical history significant of hypertension, hyperlipidemia, hypothyroidism, depression with anxiety, chronic back pain, CKD stage III, UTI, recent hospitalization from 5/8 through 06/26/2020 for sepsis secondary to UTI and E. coli bacteremia.  She presented to the hospital with fall, cough, chest wall pain, lethargy and confusion.  She was hypoxic with oxygen saturation of 85% on room air, tachypneic and tachycardic in the emergency room.  Lactic acid was 2.7.    She was admitted to the hospital for severe sepsis secondary to community-acquired pneumonia complicated by acute hypoxic respiratory failure, AKI and acute metabolic encephalopathy.  She was also found to have left rib fracture.  She was treated with analgesics, IV fluids and empiric IV antibiotics.  Urine culture showed E. coli.  There was no growth on blood culture.  She was  transitioned to oral Levaquin which she tolerated without any problems.  Her blood pressure was significantly elevated during this admission.  Amlodipine was started for blood pressure control.  However, her Deneise Lever, private home health nurse, was concerned about starting amlodipine because patient had developed low blood pressure and dizziness when she was started on amlodipine for blood pressure control in the past.  She said her blood pressure for the most part had been well controlled at home.   Her condition has improved and she is deemed stable for discharge to home today.  Close follow-up with PCP strongly recommended.  Plan was discussed with private health caregiver at the bedside.    Discharge Exam:    Vitals:   08/09/20 1933 08/10/20 0555 08/10/20 0805 08/10/20 1200  BP: 122/65 (!) 149/84 (!) 144/118 131/67  Pulse: 93 100 (!) 108   Resp: 18 18 16    Temp: 97.9 F (36.6 C) 98.1 F (36.7 C) 97.9 F (36.6 C)   TempSrc: Oral     SpO2: 100% 94% 95%   Weight:      Height:         GEN: NAD SKIN: Warm and dry EYES: EOMI ENT: MMM CV: RRR PULM: CTA B ABD: soft, ND, NT, +BS CNS: AAO x 3, non focal EXT: No edema or tenderness MSK: Mild left anterolateral chest wall tenderness   The results of significant diagnostics  from this hospitalization (including imaging, microbiology, ancillary and laboratory) are listed below for reference.     Procedures and Diagnostic Studies:   CT Head Wo Contrast  Result Date: 08/05/2020 CLINICAL DATA:  Fall last Thursday lethargic rib fracture EXAM: CT HEAD WITHOUT CONTRAST CT CERVICAL SPINE WITHOUT CONTRAST TECHNIQUE: Multidetector CT imaging of the head and cervical spine was performed following the standard protocol without intravenous contrast. Multiplanar CT image reconstructions of the cervical spine were also generated. COMPARISON:  CT brain 04/19/2020, MRI 04/17/2020, CT 04/19/2020 FINDINGS: CT HEAD FINDINGS Brain: No acute territorial  infarction, hemorrhage or intracranial mass. Mild atrophy. Moderate white matter hypodensity consistent with chronic small vessel ischemic change. Probable chronic lacunar infarct in the right basal ganglia. Stable ventricle size. Vascular: No hyperdense vessels.  Carotid vascular calcification Skull: Normal. Negative for fracture or focal lesion. Sinuses/Orbits: No acute finding. Other: None. CT CERVICAL SPINE FINDINGS Alignment: No subluxation.  Facet alignment is maintained Skull base and vertebrae: Craniovertebral junction is intact. Cervical vertebral bodies demonstrate normal stature. Moderate compression fracture T1 involves both the superior and inferior endplates with approximately 40% loss of vertebral body height anteriorly. Fracture involves the left lamina and extends to the inferior facet as before. Mild retropulsion of inferior vertebral body. Soft tissues and spinal canal: No prevertebral fluid or swelling. No visible canal hematoma. Disc levels: Moderate degenerative osteophytes with relatively patent disc spaces. Hypertrophic facet degenerative changes at multiple levels. Upper chest: Negative. Other: None IMPRESSION: 1. No CT evidence for acute intracranial abnormality. Atrophy and chronic small vessel ischemic change of the white matter 2. No acute osseous abnormality of the cervical spine. Moderate compression deformity of T1 vertebral, grossly unchanged as compared with CT from March 2022. Electronically Signed   By: Donavan Foil M.D.   On: 08/05/2020 15:28   CT CHEST WO CONTRAST  Result Date: 08/05/2020 CLINICAL DATA:  Fall history of rib fracture EXAM: CT CHEST WITHOUT CONTRAST TECHNIQUE: Multidetector CT imaging of the chest was performed following the standard protocol without IV contrast. COMPARISON:  Radiographs 08/04/2020, CT 04/19/2020 FINDINGS: Cardiovascular: Limited evaluation without intravenous contrast. Moderate aortic atherosclerosis. No aneurysm. Coronary vascular  calcification. Borderline to mild cardiomegaly. No pericardial effusion. Mediastinum/Nodes: Midline trachea. No suspicious thyroid mass. Borderline prevascular lymph node measuring 9 mm. Right precarinal lymph node measuring 10 mm. Large hiatal hernia. Lungs/Pleura: Trace left effusion. No pneumothorax. Partial atelectasis at the lower lobes. Scattered hazy pulmonary densities which may be due to atelectasis or small airways disease. Upper Abdomen: No acute abnormality. Musculoskeletal: Sternum is intact. Moderate compression deformity of T1. Moderate severe compression deformity of T8 with slight progressed loss of height diffusely of the vertebra. Minimal retropulsion inferior aspect of vertebral body. Subacute to chronic right fourth, 6, and seventh anterior to anterolateral rib fractures. Subacute to chronic right eighth posterior rib fracture. Acute appearing mildly displaced left sixth and seventh posterior rib fractures. IMPRESSION: 1. Trace left pleural effusion. Partial atelectasis in the lower lobes. No pneumothorax. 2. Acute appearing mildly displaced left sixth and seventh posterior rib fractures. Multiple subacute to chronic right rib fractures. 3. Large hiatal hernia 4. Moderate compression deformity T1 with moderate severe compression deformity of T8. T1 deformity grossly unchanged as compared with 04/19/2020, slight progression in loss of height diffusely of T8 vertebral body since 04/19/2020 Aortic Atherosclerosis (ICD10-I70.0). Electronically Signed   By: Donavan Foil M.D.   On: 08/05/2020 15:39   CT Cervical Spine Wo Contrast  Result Date: 08/05/2020 CLINICAL DATA:  Fall last  Thursday lethargic rib fracture EXAM: CT HEAD WITHOUT CONTRAST CT CERVICAL SPINE WITHOUT CONTRAST TECHNIQUE: Multidetector CT imaging of the head and cervical spine was performed following the standard protocol without intravenous contrast. Multiplanar CT image reconstructions of the cervical spine were also generated.  COMPARISON:  CT brain 04/19/2020, MRI 04/17/2020, CT 04/19/2020 FINDINGS: CT HEAD FINDINGS Brain: No acute territorial infarction, hemorrhage or intracranial mass. Mild atrophy. Moderate white matter hypodensity consistent with chronic small vessel ischemic change. Probable chronic lacunar infarct in the right basal ganglia. Stable ventricle size. Vascular: No hyperdense vessels.  Carotid vascular calcification Skull: Normal. Negative for fracture or focal lesion. Sinuses/Orbits: No acute finding. Other: None. CT CERVICAL SPINE FINDINGS Alignment: No subluxation.  Facet alignment is maintained Skull base and vertebrae: Craniovertebral junction is intact. Cervical vertebral bodies demonstrate normal stature. Moderate compression fracture T1 involves both the superior and inferior endplates with approximately 40% loss of vertebral body height anteriorly. Fracture involves the left lamina and extends to the inferior facet as before. Mild retropulsion of inferior vertebral body. Soft tissues and spinal canal: No prevertebral fluid or swelling. No visible canal hematoma. Disc levels: Moderate degenerative osteophytes with relatively patent disc spaces. Hypertrophic facet degenerative changes at multiple levels. Upper chest: Negative. Other: None IMPRESSION: 1. No CT evidence for acute intracranial abnormality. Atrophy and chronic small vessel ischemic change of the white matter 2. No acute osseous abnormality of the cervical spine. Moderate compression deformity of T1 vertebral, grossly unchanged as compared with CT from March 2022. Electronically Signed   By: Donavan Foil M.D.   On: 08/05/2020 15:28   DG Chest Portable 1 View  Result Date: 08/05/2020 CLINICAL DATA:  Altered mental status EXAM: PORTABLE CHEST 1 VIEW COMPARISON:  08/04/2020 FINDINGS: Enlarged cardiomediastinal silhouette with trace left effusion and hazy atelectasis at the bases. Enlarged mediastinum likely due to portable technique and rotation.  Mild vascular congestion. No pneumothorax. Large hiatal hernia IMPRESSION: 1. Enlarged cardiomediastinal silhouette with slight vascular congestion 2. Trace left effusion with hazy atelectasis at the bases 3. Large hiatal hernia Electronically Signed   By: Donavan Foil M.D.   On: 08/05/2020 15:40     Labs:   Basic Metabolic Panel: Recent Labs  Lab 08/05/20 1350 08/06/20 0327 08/07/20 0540 08/08/20 0655  NA 132* 135 135 135  K 3.6 4.5 3.1* 3.8  CL 102 102 100 101  CO2 22 27 26 27   GLUCOSE 167* 117* 115* 127*  BUN 26* 20 12 12   CREATININE 1.26* 0.98 0.64 0.72  CALCIUM 8.6* 8.6* 8.7* 8.8*  MG  --   --  1.7 1.9   GFR Estimated Creatinine Clearance: 46.2 mL/min (by C-G formula based on SCr of 0.72 mg/dL). Liver Function Tests: Recent Labs  Lab 08/05/20 1350  AST 23  ALT 10  ALKPHOS 69  BILITOT 0.8  PROT 6.3*  ALBUMIN 3.4*   No results for input(s): LIPASE, AMYLASE in the last 168 hours. No results for input(s): AMMONIA in the last 168 hours. Coagulation profile Recent Labs  Lab 08/05/20 1350  INR 1.1    CBC: Recent Labs  Lab 08/05/20 1350 08/06/20 0327 08/07/20 0540  WBC 17.1* 13.8* 10.6*  NEUTROABS  --   --  8.1*  HGB 12.2 11.4* 12.0  HCT 36.9 35.1* 35.4*  MCV 87.9 88.9 85.3  PLT 246 230 251   Cardiac Enzymes: No results for input(s): CKTOTAL, CKMB, CKMBINDEX, TROPONINI in the last 168 hours. BNP: Invalid input(s): POCBNP CBG: No results for input(s): GLUCAP  in the last 168 hours. D-Dimer No results for input(s): DDIMER in the last 72 hours. Hgb A1c No results for input(s): HGBA1C in the last 72 hours. Lipid Profile No results for input(s): CHOL, HDL, LDLCALC, TRIG, CHOLHDL, LDLDIRECT in the last 72 hours. Thyroid function studies No results for input(s): TSH, T4TOTAL, T3FREE, THYROIDAB in the last 72 hours.  Invalid input(s): FREET3 Anemia work up No results for input(s): VITAMINB12, FOLATE, FERRITIN, TIBC, IRON, RETICCTPCT in the last 72  hours. Microbiology Recent Results (from the past 240 hour(s))  Resp Panel by RT-PCR (Flu A&B, Covid) Nasopharyngeal Swab     Status: None   Collection Time: 08/05/20  2:05 PM   Specimen: Nasopharyngeal Swab; Nasopharyngeal(NP) swabs in vial transport medium  Result Value Ref Range Status   SARS Coronavirus 2 by RT PCR NEGATIVE NEGATIVE Final    Comment: (NOTE) SARS-CoV-2 target nucleic acids are NOT DETECTED.  The SARS-CoV-2 RNA is generally detectable in upper respiratory specimens during the acute phase of infection. The lowest concentration of SARS-CoV-2 viral copies this assay can detect is 138 copies/mL. A negative result does not preclude SARS-Cov-2 infection and should not be used as the sole basis for treatment or other patient management decisions. A negative result may occur with  improper specimen collection/handling, submission of specimen other than nasopharyngeal swab, presence of viral mutation(s) within the areas targeted by this assay, and inadequate number of viral copies(<138 copies/mL). A negative result must be combined with clinical observations, patient history, and epidemiological information. The expected result is Negative.  Fact Sheet for Patients:  EntrepreneurPulse.com.au  Fact Sheet for Healthcare Providers:  IncredibleEmployment.be  This test is no t yet approved or cleared by the [place] FDA and  has been authorized for detection and/or diagnosis of SARS-CoV-2 by FDA under an Emergency Use Authorization (EUA). This EUA will remain  in effect (meaning this test can be used) for the duration of the COVID-19 declaration under Section 564(b)(1) of the Act, 21 U.S.C.section 360bbb-3(b)(1), unless the authorization is terminated  or revoked sooner.       Influenza A by PCR NEGATIVE NEGATIVE Final   Influenza B by PCR NEGATIVE NEGATIVE Final    Comment: (NOTE) The Xpert Xpress SARS-CoV-2/FLU/RSV plus assay  is intended as an aid in the diagnosis of influenza from Nasopharyngeal swab specimens and should not be used as a sole basis for treatment. Nasal washings and aspirates are unacceptable for Xpert Xpress SARS-CoV-2/FLU/RSV testing.  Fact Sheet for Patients: EntrepreneurPulse.com.au  Fact Sheet for Healthcare Providers: IncredibleEmployment.be  This test is not yet approved or cleared by the [place] FDA and has been authorized for detection and/or diagnosis of SARS-CoV-2 by FDA under an Emergency Use Authorization (EUA). This EUA will remain in effect (meaning this test can be used) for the duration of the COVID-19 declaration under Section 564(b)(1) of the Act, 21 U.S.C. section 360bbb-3(b)(1), unless the authorization is terminated or revoked.  Performed at [facility], [address] [place], [place] [address]   Blood Culture (routine x 2)     Status: None   Collection Time: 08/05/20  2:26 PM   Specimen: BLOOD  Result Value Ref Range Status   Specimen Description BLOOD RIGHT ANTECUBITAL  Final   Special Requests   Final    BOTTLES DRAWN AEROBIC AND ANAEROBIC Blood Culture adequate volume   Culture   Final    NO GROWTH 5 DAYS Performed at [facility], [address] [place], [place] [address]  Report Status 08/10/2020 FINAL  Final  Urine culture     Status: Abnormal   Collection Time: 08/05/20  3:16 PM   Specimen: Urine, Random  Result Value Ref Range Status   Specimen Description   Final    URINE, RANDOM Performed at [facility], [address] [place], [place] [address]    Special Requests   Final    NONE Performed at [facility], [address] [place], [place] [address]    Culture >=100,000 COLONIES/mL ESCHERICHIA COLI (A)  Final   Report Status 08/08/2020 FINAL  Final   Organism ID, Bacteria ESCHERICHIA COLI (A)  Final      Susceptibility   Escherichia coli - MIC*     AMPICILLIN >=32 RESISTANT Resistant     CEFAZOLIN <=4 SENSITIVE Sensitive     CEFEPIME <=0.12 SENSITIVE Sensitive     CEFTRIAXONE <=0.25 SENSITIVE Sensitive     CIPROFLOXACIN <=0.25 SENSITIVE Sensitive     GENTAMICIN <=1 SENSITIVE Sensitive     IMIPENEM <=0.25 SENSITIVE Sensitive     NITROFURANTOIN <=16 SENSITIVE Sensitive     TRIMETH/SULFA <=20 SENSITIVE Sensitive     AMPICILLIN/SULBACTAM >=32 RESISTANT Resistant     PIP/TAZO <=4 SENSITIVE Sensitive     * >=100,000 COLONIES/mL ESCHERICHIA COLI  Blood Culture (routine x 2)     Status: None   Collection Time: 08/05/20  4:03 PM   Specimen: BLOOD  Result Value Ref Range Status   Specimen Description BLOOD BLOOD RIGHT HAND  Final   Special Requests   Final    BOTTLES DRAWN AEROBIC ONLY Blood Culture results may not be optimal due to an inadequate volume of blood received in culture bottles   Culture   Final    NO GROWTH 5 DAYS Performed at [facility], [address] [place], [place] [address]    Report Status 08/10/2020 FINAL  Final  MRSA PCR Screening     Status: None   Collection Time: 08/07/20  1:13 PM  Result Value Ref Range Status   MRSA by PCR NEGATIVE NEGATIVE Final    Comment:        The GeneXpert MRSA Assay (FDA approved for NASAL specimens only), is one component of a comprehensive MRSA colonization surveillance program. It is not intended to diagnose MRSA infection nor to guide or monitor treatment for MRSA infections. Performed at [facility], [address] [place], [place] [address]      Discharge Instructions:   Discharge Instructions     Diet - low sodium heart healthy   Complete by: As directed    Discharge instructions   Complete by: As directed    Monitor blood pressure at home   Face-to-face encounter (required for Medicare/Medicaid patients)   Complete by: As directed    I Keilin Gamboa certify that this patient is under my care and that I, or a nurse practitioner or  physician's assistant working with me, had a face-to-face encounter that meets the physician face-to-face encounter requirements with this patient on 08/08/2020. The encounter with the patient was in whole, or in part for the following medical condition(s) which is the primary reason for home health care (List medical condition): Debility, left fifth rib fracture, s/p fall   The encounter with the patient was in whole, or in part, for the following medical condition, which is the primary reason for home health care: Debility, left fifth rib fracture, s/p fall   I certify that, based on my findings,  the following services are medically necessary home health services: Physical therapy   Reason for Medically Necessary Home Health Services: Therapy- Personnel officer, Public librarian   My clinical findings support the need for the above services: Pain interferes with ambulation/mobility   Further, I certify that my clinical findings support that this patient is homebound due to: Pain interferes with ambulation/mobility   For home use only DME 3 n 1   Complete by: As directed    Home Health   Complete by: As directed    To provide the following care/treatments:  PT [place] work     Increase activity slowly   Complete by: As directed    Increase activity slowly   Complete by: As directed       Allergies as of 08/10/2020       Reactions   Amoxicillin-pot Clavulanate Diarrhea   Cephalexin Other (See Comments), Nausea And Vomiting   Hydrochlorothiazide Other (See Comments), Nausea And Vomiting   Hydrocodone Bit-homatrop Mbr Other (See Comments)   Hydrocodone-chlorpheniramine Other (See Comments)   Metronidazole Other (See Comments)   Other reaction(s): Other (See Comments) Other Reaction: Not Assessed Other Reaction: Not Assessed   Moxifloxacin Other (See Comments)   Prednisone Nausea Only   Simvastatin Other (See Comments)   Sulfa Antibiotics Other (See  Comments)   Other reaction(s): Unknown   Sulfasalazine Other (See Comments)   Other reaction(s): Unknown   Tramadol Other (See Comments)   Celecoxib Nausea And Vomiting   Codeine Nausea Only   Other reaction(s): Other (See Comments) Other reaction(s): Unknown   Cyclobenzaprine Other (See Comments), Nausea Only   Other reaction(s): Other (See Comments)   Oxycodone-acetaminophen Nausea Only, Nausea And Vomiting   Penicillin V Potassium Rash   Other reaction(s): Other (See Comments) Other reaction(s): Unknown        Medication List     STOP taking these medications    oxyCODONE 5 MG immediate release tablet Commonly known as: Oxy IR/ROXICODONE       TAKE these medications    calcitonin (salmon) 200 UNIT/ACT nasal spray Commonly known as: MIACALCIN/FORTICAL Place 1 spray into alternate nostrils daily.   DULoxetine 30 MG capsule Commonly known as: CYMBALTA Take 30 mg by mouth 2 (two) times daily.   gabapentin 400 MG capsule Commonly known as: NEURONTIN Take 400 mg by mouth at bedtime.   latanoprost 0.005 % ophthalmic solution Commonly known as: XALATAN Place 1 drop into both eyes at bedtime.   levofloxacin 500 MG tablet Commonly known as: LEVAQUIN Take 1 tablet (500 mg total) by mouth daily for 2 days. Start taking on: August 11, 2020   levothyroxine 75 MCG tablet Commonly known as: SYNTHROID Take 75 mcg by mouth daily.   LORazepam 0.5 MG tablet Commonly known as: ATIVAN Take 0.25 mg by mouth daily.   ondansetron 4 MG disintegrating tablet Commonly known as: ZOFRAN-ODT Take 4 mg by mouth every 8 (eight) hours as needed for nausea or vomiting.   polyethylene glycol 17 g packet Commonly known as: MIRALAX / GLYCOLAX Take 17 g by mouth daily as needed for mild constipation.   PreserVision/Lutein Caps Take 1 capsule by mouth 2 (two) times daily.   timolol 0.5 % ophthalmic solution Commonly known as: TIMOPTIC Place 1 drop into both eyes daily.                Durable Medical Equipment  (From admission, onward)  Start     Ordered   08/08/20 0000  For home use only DME 3 n 1        08/08/20 1355            Follow-up Information     [facility] Follow up.   Specialty: Home Health Services Why: They resume home health services at discharge. Contact information: [address] [place] [address] [phone]                  Time coordinating discharge: 32 minutes  Signed:  Jennye Boroughs  [facility] 08/10/2020, 1:23 PM   Pager on [email or web address]. If 7PM-7AM, please contact night-coverage at [email or web address]

## 2020-08-10 NOTE — Progress Notes (Signed)
Pt and caregiver given d/c education, all questions answered. Pt caregiver given hard script for levoquin and aware of miralax OTC. Pt IV removed. Pt assisted with getting dressed and collecting belongings. Caregiver has BSC for home.

## 2020-09-10 ENCOUNTER — Other Ambulatory Visit: Payer: Self-pay | Admitting: Unknown Physician Specialty

## 2020-09-10 DIAGNOSIS — R1310 Dysphagia, unspecified: Secondary | ICD-10-CM

## 2020-09-12 ENCOUNTER — Ambulatory Visit
Admission: RE | Admit: 2020-09-12 | Discharge: 2020-09-12 | Disposition: A | Discharge status: Home / Self Care | Payer: Medicare PPO | Source: Ambulatory Visit | Attending: Unknown Physician Specialty | Admitting: Unknown Physician Specialty

## 2020-09-12 ENCOUNTER — Other Ambulatory Visit: Payer: Self-pay

## 2020-09-12 DIAGNOSIS — R1310 Dysphagia, unspecified: Secondary | ICD-10-CM | POA: Insufficient documentation

## 2020-09-12 NOTE — Progress Notes (Addendum)
Modified Barium Swallow Progress Note  Patient Details  Name: Alexandria Roman MRN: [id number] Date of Birth: 1931-11-21Today's Date: 09/12/2020  Modified Barium Swallow completed.  Full report located under Chart Review in the Imaging Section.  Brief recommendations include the following:  Clinical Impression  Pt with recent hospitalization for hypoxia on August 04, 2020. She was referred for a Modified Barium Swallow Study by Anda Latina for evaluation of globus sensation. During this study, pt presents with severe sensorimotor pharyngeal phase dysphagia that results in silent aspiration of thin liquids and nectar thick liquids. When consuming these liquids, pt has premature spillage to the pyriform sinuses with delayed swallow initiation at the pyriform sinuses. She also has very little hyolaryngeal anterior and superior movement. All of these results in decreased glottic closure and inability to protect her airway when consuming thin liquids and nectar thick liquids. As such, pt silently aspirates these liquids. When consuming puree boluses, there is penetration but aspiration is not observed. Pt had backflow of all boluses from the UES into the pharynx. When cued, pt produces a weak cough that is not productive in clearing penetrates or aspirates. A scan down also reveals retrograde movement of  boluses in her thoracic esophagus. At this time, pt is at a very high risk of aspirating as well as dehydration/malnutrition. I contacted pt's PCP office and spoke with Hilton Cork (PA). He advised sending the pt to his office for further medical management. Should pt wish to continue consuming POs recommend conservative diet of puree with pudding thick liquids.   Swallow Evaluation Recommendations   Recommended Consults:  (Evaluation by PCP)   SLP Diet Recommendations: Dysphagia 1 (Puree) solids;Pudding thick liquid   Liquid Administration via: Spoon   Medication Administration: Crushed with  puree   Supervision: Patient able to self feed   Compensations: Minimize environmental distractions;Slow rate;Small sips/bites   Postural Changes: Remain semi-upright after after feeds/meals (Comment);Seated upright at 90 degrees   Oral Care Recommendations: Oral care BID   Other Recommendations: Order thickener from pharmacy   Cobin Cadavid B. Rutherford Nail M.S., CCC-SLP, [place] Pathologist [facility] Services Office [place] 09/12/2020,3:26 PM

## 2021-01-06 ENCOUNTER — Other Ambulatory Visit: Payer: Self-pay | Admitting: Internal Medicine

## 2021-01-06 DIAGNOSIS — S22000S Wedge compression fracture of unspecified thoracic vertebra, sequela: Secondary | ICD-10-CM

## 2021-01-22 ENCOUNTER — Other Ambulatory Visit: Payer: Self-pay | Admitting: Internal Medicine

## 2021-01-22 DIAGNOSIS — R42 Dizziness and giddiness: Secondary | ICD-10-CM

## 2021-01-22 DIAGNOSIS — R4781 Slurred speech: Secondary | ICD-10-CM

## 2021-01-23 ENCOUNTER — Ambulatory Visit: Payer: Medicare PPO

## 2021-01-28 ENCOUNTER — Other Ambulatory Visit: Payer: Self-pay

## 2021-01-28 ENCOUNTER — Ambulatory Visit
Admission: RE | Admit: 2021-01-28 | Discharge: 2021-01-28 | Disposition: A | Discharge status: Home / Self Care | Payer: Medicare PPO | Source: Ambulatory Visit | Attending: Internal Medicine | Admitting: Internal Medicine

## 2021-01-28 DIAGNOSIS — R4781 Slurred speech: Secondary | ICD-10-CM

## 2021-01-28 DIAGNOSIS — R42 Dizziness and giddiness: Secondary | ICD-10-CM

## 2021-01-31 ENCOUNTER — Ambulatory Visit: Payer: Medicare PPO

## 2021-10-05 ENCOUNTER — Emergency Department: Payer: Medicare PPO

## 2021-10-05 ENCOUNTER — Inpatient Hospital Stay
Admission: EM | Admit: 2021-10-05 | Discharge: 2021-10-16 | DRG: 871 | Disposition: E | Payer: Medicare PPO | Attending: Internal Medicine | Admitting: Internal Medicine

## 2021-10-05 ENCOUNTER — Encounter: Payer: Self-pay | Admitting: Emergency Medicine

## 2021-10-05 ENCOUNTER — Other Ambulatory Visit: Payer: Self-pay

## 2021-10-05 DIAGNOSIS — G8929 Other chronic pain: Secondary | ICD-10-CM | POA: Diagnosis present

## 2021-10-05 DIAGNOSIS — R112 Nausea with vomiting, unspecified: Secondary | ICD-10-CM | POA: Diagnosis not present

## 2021-10-05 DIAGNOSIS — Z96641 Presence of right artificial hip joint: Secondary | ICD-10-CM | POA: Diagnosis present

## 2021-10-05 DIAGNOSIS — J690 Pneumonitis due to inhalation of food and vomit: Secondary | ICD-10-CM | POA: Diagnosis present

## 2021-10-05 DIAGNOSIS — A419 Sepsis, unspecified organism: Secondary | ICD-10-CM | POA: Diagnosis present

## 2021-10-05 DIAGNOSIS — F418 Other specified anxiety disorders: Secondary | ICD-10-CM

## 2021-10-05 DIAGNOSIS — J449 Chronic obstructive pulmonary disease, unspecified: Secondary | ICD-10-CM | POA: Diagnosis present

## 2021-10-05 DIAGNOSIS — E872 Acidosis, unspecified: Secondary | ICD-10-CM | POA: Diagnosis present

## 2021-10-05 DIAGNOSIS — F419 Anxiety disorder, unspecified: Secondary | ICD-10-CM | POA: Diagnosis present

## 2021-10-05 DIAGNOSIS — Z888 Allergy status to other drugs, medicaments and biological substances status: Secondary | ICD-10-CM

## 2021-10-05 DIAGNOSIS — Z66 Do not resuscitate: Secondary | ICD-10-CM | POA: Diagnosis present

## 2021-10-05 DIAGNOSIS — J9601 Acute respiratory failure with hypoxia: Secondary | ICD-10-CM | POA: Diagnosis present

## 2021-10-05 DIAGNOSIS — T17908A Unspecified foreign body in respiratory tract, part unspecified causing other injury, initial encounter: Secondary | ICD-10-CM | POA: Diagnosis not present

## 2021-10-05 DIAGNOSIS — R1310 Dysphagia, unspecified: Secondary | ICD-10-CM | POA: Diagnosis present

## 2021-10-05 DIAGNOSIS — R54 Age-related physical debility: Secondary | ICD-10-CM | POA: Diagnosis present

## 2021-10-05 DIAGNOSIS — Z882 Allergy status to sulfonamides status: Secondary | ICD-10-CM

## 2021-10-05 DIAGNOSIS — I129 Hypertensive chronic kidney disease with stage 1 through stage 4 chronic kidney disease, or unspecified chronic kidney disease: Secondary | ICD-10-CM | POA: Diagnosis present

## 2021-10-05 DIAGNOSIS — Z880 Allergy status to penicillin: Secondary | ICD-10-CM

## 2021-10-05 DIAGNOSIS — N183 Chronic kidney disease, stage 3 unspecified: Secondary | ICD-10-CM | POA: Diagnosis present

## 2021-10-05 DIAGNOSIS — N1831 Chronic kidney disease, stage 3a: Secondary | ICD-10-CM

## 2021-10-05 DIAGNOSIS — R739 Hyperglycemia, unspecified: Secondary | ICD-10-CM | POA: Diagnosis present

## 2021-10-05 DIAGNOSIS — E785 Hyperlipidemia, unspecified: Secondary | ICD-10-CM | POA: Diagnosis present

## 2021-10-05 DIAGNOSIS — G9341 Metabolic encephalopathy: Secondary | ICD-10-CM | POA: Diagnosis present

## 2021-10-05 DIAGNOSIS — G629 Polyneuropathy, unspecified: Secondary | ICD-10-CM | POA: Diagnosis present

## 2021-10-05 DIAGNOSIS — F32A Depression, unspecified: Secondary | ICD-10-CM | POA: Diagnosis present

## 2021-10-05 DIAGNOSIS — H409 Unspecified glaucoma: Secondary | ICD-10-CM | POA: Diagnosis not present

## 2021-10-05 DIAGNOSIS — Z90710 Acquired absence of both cervix and uterus: Secondary | ICD-10-CM | POA: Diagnosis not present

## 2021-10-05 DIAGNOSIS — N179 Acute kidney failure, unspecified: Secondary | ICD-10-CM

## 2021-10-05 DIAGNOSIS — K5900 Constipation, unspecified: Secondary | ICD-10-CM | POA: Diagnosis not present

## 2021-10-05 DIAGNOSIS — Z20822 Contact with and (suspected) exposure to covid-19: Secondary | ICD-10-CM | POA: Diagnosis present

## 2021-10-05 DIAGNOSIS — R6520 Severe sepsis without septic shock: Secondary | ICD-10-CM | POA: Diagnosis present

## 2021-10-05 DIAGNOSIS — E039 Hypothyroidism, unspecified: Secondary | ICD-10-CM

## 2021-10-05 DIAGNOSIS — R109 Unspecified abdominal pain: Secondary | ICD-10-CM | POA: Diagnosis present

## 2021-10-05 DIAGNOSIS — Z885 Allergy status to narcotic agent status: Secondary | ICD-10-CM

## 2021-10-05 DIAGNOSIS — Z79890 Hormone replacement therapy (postmenopausal): Secondary | ICD-10-CM

## 2021-10-05 DIAGNOSIS — N390 Urinary tract infection, site not specified: Secondary | ICD-10-CM | POA: Diagnosis present

## 2021-10-05 DIAGNOSIS — Z881 Allergy status to other antibiotic agents status: Secondary | ICD-10-CM

## 2021-10-05 DIAGNOSIS — R6521 Severe sepsis with septic shock: Secondary | ICD-10-CM | POA: Diagnosis present

## 2021-10-05 DIAGNOSIS — E440 Moderate protein-calorie malnutrition: Secondary | ICD-10-CM | POA: Diagnosis present

## 2021-10-05 DIAGNOSIS — K449 Diaphragmatic hernia without obstruction or gangrene: Secondary | ICD-10-CM | POA: Diagnosis present

## 2021-10-05 DIAGNOSIS — Z79899 Other long term (current) drug therapy: Secondary | ICD-10-CM

## 2021-10-05 DIAGNOSIS — Z6825 Body mass index (BMI) 25.0-25.9, adult: Secondary | ICD-10-CM

## 2021-10-05 LAB — COMPREHENSIVE METABOLIC PANEL
ALT: 11 U/L (ref 0–44)
AST: 27 U/L (ref 15–41)
Albumin: 3.8 g/dL (ref 3.5–5.0)
Alkaline Phosphatase: 61 U/L (ref 38–126)
Anion gap: 15 (ref 5–15)
BUN: 39 mg/dL — ABNORMAL HIGH (ref 8–23)
CO2: 22 mmol/L (ref 22–32)
Calcium: 9.4 mg/dL (ref 8.9–10.3)
Chloride: 98 mmol/L (ref 98–111)
Creatinine, Ser: 1.61 mg/dL — ABNORMAL HIGH (ref 0.44–1.00)
GFR, Estimated: 30 mL/min — ABNORMAL LOW (ref >60)
Glucose, Bld: 247 mg/dL — ABNORMAL HIGH (ref 70–99)
Potassium: 4.9 mmol/L (ref 3.5–5.1)
Sodium: 135 mmol/L (ref 135–145)
Total Bilirubin: 1.1 mg/dL (ref 0.3–1.2)
Total Protein: 7.5 g/dL (ref 6.5–8.1)

## 2021-10-05 LAB — CBC
HCT: 60.5 % — ABNORMAL HIGH (ref 36.0–46.0)
Hemoglobin: 18.8 g/dL — ABNORMAL HIGH (ref 12.0–15.0)
MCH: 29 pg (ref 26.0–34.0)
MCHC: 31.1 g/dL (ref 30.0–36.0)
MCV: 93.4 fL (ref 80.0–100.0)
Platelets: 369 10*3/uL (ref 150–400)
RBC: 6.48 MIL/uL — ABNORMAL HIGH (ref 3.87–5.11)
RDW: 14 % (ref 11.5–15.5)
WBC: 8.9 10*3/uL (ref 4.0–10.5)
nRBC: 0 % (ref 0.0–0.2)

## 2021-10-05 LAB — BLOOD GAS, VENOUS
Acid-base deficit: 2.1 mmol/L — ABNORMAL HIGH (ref 0.0–2.0)
Bicarbonate: 26 mmol/L (ref 20.0–28.0)
FIO2: 100 %
O2 Content: 15 L/min
O2 Saturation: 24.6 %
Patient temperature: 37
pCO2, Ven: 58 mmHg (ref 44–60)
pH, Ven: 7.26 (ref 7.25–7.43)
pO2, Ven: <31 mmHg — CL (ref 32–45)

## 2021-10-05 LAB — LIPASE, BLOOD: Lipase: 27 U/L (ref 11–51)

## 2021-10-05 LAB — TROPONIN I (HIGH SENSITIVITY): Troponin I (High Sensitivity): 13 ng/L (ref <18)

## 2021-10-05 LAB — LACTIC ACID, PLASMA: Lactic Acid, Venous: 4.9 mmol/L (ref 0.5–1.9)

## 2021-10-05 LAB — SARS CORONAVIRUS 2 BY RT PCR: SARS Coronavirus 2 by RT PCR: NEGATIVE

## 2021-10-05 MED ORDER — OCUVITE-LUTEIN PO CAPS
1.0000 | ORAL_CAPSULE | Freq: Two times a day (BID) | ORAL | Status: DC
  Filled 2021-10-05 (×7): qty 1

## 2021-10-05 MED ORDER — SODIUM CHLORIDE 0.9 % IV SOLN
500.0000 mg | Freq: Once | INTRAVENOUS | Status: AC
  Administered 2021-10-05: 500 mg via INTRAVENOUS
  Filled 2021-10-05: qty 5

## 2021-10-05 MED ORDER — SODIUM CHLORIDE 0.9 % IV SOLN
1.0000 g | Freq: Once | INTRAVENOUS | Status: AC
  Administered 2021-10-05: 1 g via INTRAVENOUS
  Filled 2021-10-05: qty 10

## 2021-10-05 MED ORDER — POLYETHYLENE GLYCOL 3350 17 G PO PACK
17.0000 g | PACK | Freq: Every day | ORAL | Status: DC | PRN
  Filled 2021-10-05: qty 1

## 2021-10-05 MED ORDER — SODIUM CHLORIDE 0.9 % IV SOLN
3.0000 g | Freq: Once | INTRAVENOUS | Status: AC
  Administered 2021-10-05: 3 g via INTRAVENOUS
  Filled 2021-10-05: qty 8

## 2021-10-05 MED ORDER — TIMOLOL MALEATE 0.5 % OP SOLN
1.0000 [drp] | Freq: Two times a day (BID) | OPHTHALMIC | Status: DC
Start: 2021-10-06 — End: 2021-10-11
  Administered 2021-10-06 – 2021-10-11 (×9): 1 [drp] via OPHTHALMIC
  Filled 2021-10-05 (×5): qty 5

## 2021-10-05 MED ORDER — SODIUM CHLORIDE 0.9 % IV SOLN
3.0000 g | Freq: Two times a day (BID) | INTRAVENOUS | Status: DC
  Administered 2021-10-06 – 2021-10-08 (×5): 3 g via INTRAVENOUS
  Filled 2021-10-05 (×3): qty 3
  Filled 2021-10-05 (×3): qty 8

## 2021-10-05 MED ORDER — DULOXETINE HCL 30 MG PO CPEP
30.0000 mg | ORAL_CAPSULE | Freq: Every day | ORAL | Status: DC
  Filled 2021-10-05: qty 1

## 2021-10-05 MED ORDER — ONDANSETRON HCL 4 MG/2ML IJ SOLN
4.0000 mg | Freq: Four times a day (QID) | INTRAMUSCULAR | Status: DC | PRN
Start: 2021-10-05 — End: 2021-10-11
  Administered 2021-10-05 – 2021-10-11 (×5): 4 mg via INTRAVENOUS
  Filled 2021-10-05 (×5): qty 2

## 2021-10-05 MED ORDER — SODIUM CHLORIDE 0.9 % IV SOLN: INTRAVENOUS | Status: AC

## 2021-10-05 MED ORDER — ACETAMINOPHEN 325 MG PO TABS: 650.0000 mg | ORAL_TABLET | Freq: Four times a day (QID) | ORAL | Status: DC | PRN

## 2021-10-05 MED ORDER — ENOXAPARIN SODIUM 30 MG/0.3ML IJ SOSY
30.0000 mg | PREFILLED_SYRINGE | INTRAMUSCULAR | Status: DC
  Administered 2021-10-06 – 2021-10-08 (×3): 30 mg via SUBCUTANEOUS
  Filled 2021-10-05 (×3): qty 0.3

## 2021-10-05 MED ORDER — SODIUM CHLORIDE 0.9 % IV BOLUS
500.0000 mL | Freq: Once | INTRAVENOUS | Status: AC
  Administered 2021-10-05: 500 mL via INTRAVENOUS

## 2021-10-05 MED ORDER — PANTOPRAZOLE SODIUM 40 MG IV SOLR
40.0000 mg | Freq: Every day | INTRAVENOUS | Status: DC
  Administered 2021-10-06 – 2021-10-11 (×7): 40 mg via INTRAVENOUS
  Filled 2021-10-05 (×7): qty 10

## 2021-10-05 MED ORDER — LATANOPROST 0.005 % OP SOLN
1.0000 [drp] | Freq: Every day | OPHTHALMIC | Status: DC
  Administered 2021-10-06 – 2021-10-08 (×3): 1 [drp] via OPHTHALMIC
  Filled 2021-10-05 (×5): qty 2.5

## 2021-10-05 MED ORDER — ACETAMINOPHEN 650 MG RE SUPP: 650.0000 mg | Freq: Four times a day (QID) | RECTAL | Status: DC | PRN

## 2021-10-05 MED ORDER — STERILE WATER FOR INJECTION IJ SOLN
INTRAMUSCULAR | Status: AC
  Administered 2021-10-05: 2 mL
  Filled 2021-10-05: qty 10

## 2021-10-05 MED ORDER — METHYLPREDNISOLONE SODIUM SUCC 125 MG IJ SOLR
125.0000 mg | Freq: Once | INTRAMUSCULAR | Status: AC
Start: 2021-10-05 — End: 2021-10-05
  Administered 2021-10-05: 125 mg via INTRAVENOUS
  Filled 2021-10-05: qty 2

## 2021-10-05 MED ORDER — SODIUM CHLORIDE 0.9% FLUSH
3.0000 mL | Freq: Two times a day (BID) | INTRAVENOUS | Status: DC
  Administered 2021-10-05 – 2021-10-11 (×12): 3 mL via INTRAVENOUS

## 2021-10-05 MED ORDER — LEVOTHYROXINE SODIUM 50 MCG PO TABS: 50.0000 ug | ORAL_TABLET | Freq: Every day | ORAL | Status: DC

## 2021-10-05 NOTE — ED Triage Notes (Addendum)
Pt arrived via [facility] from [facility] living with c/o N/V that started this afternoon. Per EMS, pt was 82-89% RA and was mouth breathing with auditory wheezing. Per pts home nurse, pt has breathing treatments. Pt hospitalized in May with hypoxia as well with oxygen sats in 80's on RA. No hx/o COPD or lung disease. Pt placed on 3L oxygen upon arrival by Probation officer. Pt also with recently hospitalization for UTI and sepsis.

## 2021-10-05 NOTE — ED Notes (Signed)
Pt short of breath, 02 sat=66% on 4LNC. 15L NRB applied, MD and RT notified. MD and RT to bedside.

## 2021-10-05 NOTE — ED Provider Notes (Signed)
[facility] Provider Note   Event Date/Time   First MD Initiated Contact with Patient 09/29/2021 2126     (approximate) History  Emesis  HPI Alexandria Roman is a 88 y.o. female with past medical history of CKD, hypothyroidism, and neuropathy who presents for worsening shortness of breath throughout the day after an episode of nausea/vomiting earlier.  Patient was in her independent living facility when a home health care nurse came to check on her and found her breathing at approximately 35 times a minute.  She then called EMS who noticed patient was hypoxic to the 70s and placed on 3 L nasal cannula.  Patient now requiring BiPAP.  Patient tachypneic and not easily answering questions but currently denies any chest pain ROS: Patient currently denies any vision changes, tinnitus, difficulty speaking, facial droop, sore throat, abdominal pain, diarrhea, dysuria, or weakness/numbness/paresthesias in any extremity   Physical Exam  Triage Vital Signs: ED Triage Vitals  Enc Vitals Group     BP 09/17/2021 2100 (!) 78/62     Pulse Rate 09/28/2021 2100 (!) 137     Resp 09/30/2021 2100 18     Temp 10/13/2021 2100 97.7 F (36.5 C)     Temp Source 09/29/2021 2100 Oral     SpO2 09/27/2021 2100 100 %     Weight 09/18/2021 2119 152 lb 1.9 oz (69 kg)     Height 09/19/2021 2119 '5\' 4"'$  (1.626 m)     Head Circumference --      Peak Flow --      Pain Score --      Pain Loc --      Pain Edu? --      Excl. in [place]? --    Most recent vital signs: Vitals:   10/10/2021 2245 09/23/2021 2300  BP:  93/73  Pulse: 87 (!) 35  Resp: (!) 29 (!) 35  Temp:    SpO2: 91% (!) 88%   General: Awake, oriented x4. CV:  Good peripheral perfusion.  Resp:  Increased effort.  Wheezing over bilateral lung fields Abd:  No distention.  Other:  Elderly Caucasian female laying in bed in moderate respiratory distress ED Results / Procedures / Treatments  Labs (all labs ordered are listed, but only abnormal results are  displayed) Labs Reviewed  COMPREHENSIVE METABOLIC PANEL - Abnormal; Notable for the following components:      Result Value   Glucose, Bld 247 (*)    BUN 39 (*)    Creatinine, Ser 1.61 (*)    GFR, Estimated 30 (*)    All other components within normal limits  CBC - Abnormal; Notable for the following components:   RBC 6.48 (*)    Hemoglobin 18.8 (*)    HCT 60.5 (*)    All other components within normal limits  LACTIC ACID, PLASMA - Abnormal; Notable for the following components:   Lactic Acid, Venous 4.9 (*)    All other components within normal limits  BLOOD GAS, VENOUS - Abnormal; Notable for the following components:   pO2, Ven <31 (*)    Acid-base deficit 2.1 (*)    All other components within normal limits  SARS CORONAVIRUS 2 BY RT PCR  CULTURE, BLOOD (ROUTINE X 2)  CULTURE, BLOOD (ROUTINE X 2)  LIPASE, BLOOD  URINALYSIS, ROUTINE W REFLEX MICROSCOPIC  LACTIC ACID, PLASMA  TROPONIN I (HIGH SENSITIVITY)  TROPONIN I (HIGH SENSITIVITY)   EKG ED ECG REPORT I, Naaman Plummer, the attending physician,  personally viewed and interpreted this ECG. Date: 09/18/2021 EKG Time: 2236 Rate: 133 Rhythm: Tachycardic sinus rhythm QRS Axis: normal Intervals: normal ST/T Wave abnormalities: normal Narrative Interpretation: Tachycardic sinus rhythm.  No evidence of acute ischemia RADIOLOGY ED MD interpretation: Single view portable chest x-ray shows bilateral lower lung field opacities like representing developing infiltrate or aspiration as well as a large hiatal hernia -Agree with radiology assessment Official radiology report(s): DG Chest Port 1 View  Result Date: 09/30/2021 CLINICAL DATA:  Hypoxia. EXAM: PORTABLE CHEST 1 VIEW COMPARISON:  Chest radiograph and CT dated 08/05/2020. FINDINGS: Bilateral lower lung field opacities new since the radiograph of 08/05/2020 and may represent developing infiltrate or aspiration. There is a large hiatal hernia containing the majority of the  stomach. No large pleural effusion. No pneumothorax. The cardiac silhouette is within limits. Atherosclerotic calcification of the aorta. No acute osseous pathology. IMPRESSION: 1. Bilateral lower lung field opacities may represent developing infiltrate or aspiration. 2. Large hiatal hernia. Electronically Signed   By: Anner Crete M.D.   On: 10/01/2021 21:41   PROCEDURES: Critical Care performed: Yes, see critical care procedure note(s) .1-3 Lead EKG Interpretation  Performed by: Naaman Plummer, MD Authorized by: Naaman Plummer, MD     Interpretation: abnormal     ECG rate:  121   ECG rate assessment: tachycardic     Rhythm: sinus tachycardia     Ectopy: none     Conduction: normal   CRITICAL CARE Performed by: Naaman Plummer  Total critical care time: 35 minutes  Critical care time was exclusive of separately billable procedures and treating other patients.  Critical care was necessary to treat or prevent imminent or life-threatening deterioration.  Critical care was time spent personally by me on the following activities: development of treatment plan with patient and/or surrogate as well as nursing, discussions with consultants, evaluation of patient's response to treatment, examination of patient, obtaining history from patient or surrogate, ordering and performing treatments and interventions, ordering and review of laboratory studies, ordering and review of radiographic studies, pulse oximetry and re-evaluation of patient's condition.  MEDICATIONS ORDERED IN ED: Medications  azithromycin (ZITHROMAX) 500 mg in sodium chloride 0.9 % 250 mL IVPB (500 mg Intravenous New Bag/Given 09/25/2021 2229)  Ampicillin-Sulbactam (UNASYN) 3 g in sodium chloride 0.9 % 100 mL IVPB (3 g Intravenous New Bag/Given 09/15/2021 2303)  methylPREDNISolone sodium succinate (SOLU-MEDROL) 125 mg/2 mL injection 125 mg (125 mg Intravenous Given 10/07/2021 2226)  cefTRIAXone (ROCEPHIN) 1 g in sodium chloride 0.9 %  100 mL IVPB (0 g Intravenous Stopped 10/13/2021 2257)  sterile water (preservative free) injection (2 mLs  Given 09/16/2021 2230)   IMPRESSION / MDM / ASSESSMENT AND PLAN / ED COURSE  I reviewed the triage vital signs and the nursing notes.                             The patient is on the cardiac monitor to evaluate for evidence of arrhythmia and/or significant heart rate changes. Patient's presentation is most consistent with acute presentation with potential threat to life or bodily function. Presents with shortness of breath, cough, and malaise concerning for pneumonia likely with a concurrent COPD exacerbation  DDx: PE, Pneumothorax, TB, Atypical ACS, Esophageal Rupture, Toxic Exposure, Foreign Body Airway Obstruction.  Workup: CXR CBC, CMP, lactate, troponin  Given History, Exam, and Workup presentation most consistent with pneumonia.  Findings: Chest x-ray concerning for aspiration pneumonia  Tx: Ceftriaxone 1g IV Azithromycin '500mg'$  IV Flagyl 500 mg IV 2238 Reassessment: As patient is continuing to require supplemental oxygenation via BiPAP for acute hypoxic respiratory failure, patient will require admission to the internal medicine service for further evaluation and management  Disposition: Admit   FINAL CLINICAL IMPRESSION(S) / ED DIAGNOSES   Final diagnoses:  Acute respiratory failure with hypoxia (HCC)  Nausea and vomiting, unspecified vomiting type  Aspiration pneumonia of right lower lobe due to vomit ([place])   Rx / DC Orders   ED Discharge Orders     None      Note:  This document was prepared using Dragon voice recognition software and may include unintentional dictation errors.   Naaman Plummer, MD 09/29/2021 [id number]

## 2021-10-05 NOTE — H&P (Addendum)
History and Physical   Alexandria Roman HEN:[id number] DOB: Feb 07, 1930 DOA: 10/06/2021  PCP: Adin Hector, MD   Patient coming from: Independent living facility  Chief Complaint: Shortness of breath  HPI: Alexandria Roman is a 88 y.o. female with medical history significant of hypertension, chronic pain, CKD 3A, hypothyroidism, hyperlipidemia, depression, anxiety, COPD, cough, presenting with progressive shortness of breath.  Patient reportedly presenting with progressively worsening shortness of breath after episode of nausea and vomiting earlier.  Her nausea vomiting follows eating seafood on Sunday.  She initially had just nausea Sunday night and then vomiting Monday morning.  She lives at an independent living facility and history is provided with assistance of her care nurse.  Her nurse went to check on her today and noted that she was tachypneic and EMS was called.  On their arrival patient was saturating in the 70% range and placed on 3 L.  Remained tachypneic in route and was placed on BiPAP in the ED.  Nurse does also report some dark stool but no blood.  Denies fevers, chills, chest pain, abdominal pain, constipation, diarrhea.   ED Course: Vital signs in ED significant for initial tachypnea in the 40s improved down to the 20s.  Initial hypoxia in the 70s now improved to the 90s to 100s.  Blood pressure initially had a reading in the 70 systolic now in the 36R to 443 systolic.  Heart rate in the 120s.  Patient is above initially on 3 L and placed on BiPAP with improvement.  Lab work-up included CMP with BUN elevated 39 creatinine elevated to 1.61 from baseline around 0.8, glucose 247.  CBC with hemoglobin elevated to 18.8 likely hemoconcentration.  Lactic acid elevated to 4.9 with repeat pending.  Lipase normal.  Troponin pending.  COVID test negative.  Urinalysis pending.  Blood cultures pending.  VBG with normal pH and PCO2.  On BiPAP.  Chest x-ray with bilateral lower lobe opacities  representing infiltrate or aspiration with large hiatal hernia also noted and normal cardiac silhouette.  Patient received ceftriaxone, azithromycin, Unasyn and a dose of Solu-Medrol in the ED.  Review of Systems: As per HPI otherwise all other systems reviewed and are negative.  Past Medical History:  Diagnosis Date   Cancer [facility])    Hypertension     Past Surgical History:  Procedure Laterality Date   ABDOMINAL HYSTERECTOMY     HIP ARTHROPLASTY Right 05/20/2018   Procedure: ARTHROPLASTY BIPOLAR HIP (HEMIARTHROPLASTY);  Surgeon: Thornton Park, MD;  Location: [facility] ORS;  Service: Orthopedics;  Laterality: Right;    Social History  reports that she has never smoked. She has never used smokeless tobacco. She reports that she does not drink alcohol and does not use drugs.  Allergies  Allergen Reactions   Amoxicillin-Pot Clavulanate Diarrhea   Cephalexin Other (See Comments) and Nausea And Vomiting   Hydrochlorothiazide Other (See Comments) and Nausea And Vomiting   Hydrocodone Bit-Homatrop Mbr Other (See Comments)   Hydrocodone-Chlorpheniramine Other (See Comments)   Metronidazole Other (See Comments)    Other reaction(s): Other (See Comments) Other Reaction: Not Assessed Other Reaction: Not Assessed   Moxifloxacin Other (See Comments)   Prednisone Nausea Only   Simvastatin Other (See Comments)   Sulfa Antibiotics Other (See Comments)    Other reaction(s): Unknown   Sulfasalazine Other (See Comments)    Other reaction(s): Unknown   Tramadol Other (See Comments)   Celecoxib Nausea And Vomiting   Codeine Nausea Only    Other reaction(s):  Other (See Comments) Other reaction(s): Unknown   Cyclobenzaprine Other (See Comments) and Nausea Only    Other reaction(s): Other (See Comments)   Oxycodone-Acetaminophen Nausea Only and Nausea And Vomiting   Penicillin V Potassium Rash    Other reaction(s): Other (See Comments) Other reaction(s): Unknown    Family History  Problem  Relation Age of Onset   Testicular cancer Father   Reviewed on admission  Prior to Admission medications   Medication Sig Start Date End Date Taking? Authorizing Provider  calcitonin, salmon, (MIACALCIN/FORTICAL) 200 UNIT/ACT nasal spray Place 1 spray into alternate nostrils daily. 04/23/20   Sharen Hones, MD  DULoxetine (CYMBALTA) 30 MG capsule Take 30 mg by mouth 2 (two) times daily.    [provider]  gabapentin (NEURONTIN) 400 MG capsule Take 400 mg by mouth at bedtime.    [provider]  latanoprost (XALATAN) 0.005 % ophthalmic solution Place 1 drop into both eyes at bedtime.    [provider]  levothyroxine (SYNTHROID) 75 MCG tablet Take 75 mcg by mouth daily. 06/12/20   [provider]  LORazepam (ATIVAN) 0.5 MG tablet Take 0.25 mg by mouth daily.    [provider]  Multiple Vitamins-Minerals (PRESERVISION/LUTEIN) CAPS Take 1 capsule by mouth 2 (two) times daily.    [provider]  ondansetron (ZOFRAN-ODT) 4 MG disintegrating tablet Take 4 mg by mouth every 8 (eight) hours as needed for nausea or vomiting.    [provider]  polyethylene glycol (MIRALAX / GLYCOLAX) 17 g packet Take 17 g by mouth daily as needed for mild constipation. 08/09/20   Jennye Boroughs, MD  timolol (TIMOPTIC) 0.5 % ophthalmic solution Place 1 drop into both eyes daily. 12/07/19   [provider]  amLODipine (NORVASC) 2.5 MG tablet Take 1 tablet (2.5 mg total) by mouth daily. 08/09/20 08/10/20  Jennye Boroughs, MD  enoxaparin (LOVENOX) 40 MG/0.4ML injection Inject 0.4 mLs (40 mg total) into the skin daily for 28 days. 05/24/18 05/13/19  Demetrios Loll, MD  losartan (COZAAR) 25 MG tablet Take 1 tablet (25 mg total) by mouth daily. 05/23/18 05/13/19  Demetrios Loll, MD  metoprolol succinate (TOPROL-XL) 25 MG 24 hr tablet Take 1 tablet by mouth daily. 03/21/19 01/28/20  [provider]    Physical Exam: Vitals:   09/22/2021 2300 09/29/2021 2315 10/09/2021  2330 09/21/2021 2345  BP: 93/73 105/80 111/84 125/87  Pulse: (!) 35 (!) 33 (!) 32 (!) 119  Resp: (!) 35 (!) 27 (!) 32 (!) 31  Temp:      TempSrc:      SpO2: (!) 88% (!) 83% 100% 100%  Weight:      Height:        Physical Exam Constitutional:      General: She is not in acute distress.    Appearance: She is ill-appearing.     Comments: Elderly female  HENT:     Head: Normocephalic and atraumatic.     Mouth/Throat:     Mouth: Mucous membranes are moist.     Pharynx: Oropharynx is clear.  Eyes:     Extraocular Movements: Extraocular movements intact.     Pupils: Pupils are equal, round, and reactive to light.  Cardiovascular:     Rate and Rhythm: Regular rhythm. Tachycardia present.     Pulses: Normal pulses.     Heart sounds: Normal heart sounds.  Pulmonary:     Effort: Pulmonary effort is normal. No respiratory distress.     Breath sounds: Rhonchi  present.     Comments: On BiPAP Abdominal:     General: Bowel sounds are normal. There is no distension.     Palpations: Abdomen is soft.     Tenderness: There is no abdominal tenderness.  Musculoskeletal:        General: No swelling or deformity.  Skin:    General: Skin is warm and dry.  Neurological:     General: No focal deficit present.     Mental Status: Mental status is at baseline.    Labs on Admission: I have personally reviewed following labs and imaging studies  CBC: Recent Labs  Lab 10/04/2021 2154  WBC 8.9  HGB 18.8*  HCT 60.5*  MCV 93.4  PLT 761    Basic Metabolic Panel: Recent Labs  Lab 10/10/2021 2154  NA 135  K 4.9  CL 98  CO2 22  GLUCOSE 247*  BUN 39*  CREATININE 1.61*  CALCIUM 9.4    GFR: Estimated Creatinine Clearance: 22.6 mL/min (A) (by C-G formula based on SCr of 1.61 mg/dL (H)).  Liver Function Tests: Recent Labs  Lab 10/13/2021 2154  AST 27  ALT 11  ALKPHOS 61  BILITOT 1.1  PROT 7.5  ALBUMIN 3.8    Urine analysis:    Component Value Date/Time   COLORURINE YELLOW (A)  08/05/2020 1516   APPEARANCEUR CLOUDY (A) 08/05/2020 1516   LABSPEC 1.018 08/05/2020 1516   PHURINE 5.0 08/05/2020 1516   GLUCOSEU NEGATIVE 08/05/2020 1516   HGBUR NEGATIVE 08/05/2020 1516   [place] NEGATIVE 08/05/2020 1516   [place] 08/05/2020 1516   PROTEINUR 100 (A) 08/05/2020 1516   NITRITE NEGATIVE 08/05/2020 1516   LEUKOCYTESUR LARGE (A) 08/05/2020 1516    Radiological Exams on Admission: DG Chest Port 1 View  Result Date: 09/24/2021 CLINICAL DATA:  Hypoxia. EXAM: PORTABLE CHEST 1 VIEW COMPARISON:  Chest radiograph and CT dated 08/05/2020. FINDINGS: Bilateral lower lung field opacities new since the radiograph of 08/05/2020 and may represent developing infiltrate or aspiration. There is a large hiatal hernia containing the majority of the stomach. No large pleural effusion. No pneumothorax. The cardiac silhouette is within limits. Atherosclerotic calcification of the aorta. No acute osseous pathology. IMPRESSION: 1. Bilateral lower lung field opacities may represent developing infiltrate or aspiration. 2. Large hiatal hernia. Electronically Signed   By: Anner Crete M.D.   On: 09/17/2021 21:41    EKG: Independently reviewed.  Sinus tachycardia 133 bpm.  Nonspecific ST changes.  Assessment/Plan Principal Problem:   Acute respiratory failure with hypoxia (HCC) Active Problems:   Hypothyroidism, unspecified   Depression with anxiety   CKD (chronic kidney disease), stage IIIa   Acute renal failure superimposed on stage 3a chronic kidney disease (HCC)   Aspiration into respiratory tract   Acute respiratory failure with hypoxia Aspiration Dysphagia Rule out pneumonia > Patient presenting with worsening shortness of breath following episode of nausea vomiting earlier. > Found to be hypoxic after EMS was called by nurse for tachypnea. > Initially was saturating the 70s and was placed on 3 L but remained significantly tachypneic and was placed on BiPAP in the  ED. > Nausea and vomiting is new and follows nausea that started the night before after having seafood. > X-ray with significant bilateral lower lobe opacities, will monitor for developing pneumonia. > Reported history of dysphagia, has in the past declined thickened liquids, but has been monitored regularly with chest x-rays without any issues until now. > Vomit appeared to be dark per RN, no  frank blood, no history of this.  Hemoglobin is reassuring. - Continue with BiPAP - Can continue with Unasyn given known episode nausea vomiting and chest x-ray consistent with aspiration - Trend fever and WBC - Wean off BiPAP/oxygen as tolerated - Trend lactic acid - IV PPI for now, while on BiPAP  AKI on CKD 3A Lactic acidosis > Creatinine elevated to 1.6 on baseline around 0.8. > In the setting of recurrent nausea vomiting. > Also did have elevated lactic acid at 4.9 in the ED. > Lactic acidosis likely multifactorial in the setting of AKI, hypoxia, accessory muscle use. - 500 cc bolus now, IV fluids overnight - Trend lactic acid (with repeat lab an hour or 2 after boluses given) - Trend renal function and electrolytes - Avoid nephrotoxic agents when able  Glaucoma - Continue home timolol and latanoprost  Depression Anxiety - Continue home duloxetine  Hypothyroidism - Continue home Synthroid  DVT prophylaxis: Lovenox Code Status:   Full  Family Communication:  Caretaker updated at bedside.  Caretaker is Wende Neighbors, RN with [facility], can be reached at [phone]  Disposition Plan:   Patient is from:  Assisted living  Anticipated DC to:  Same as above  Anticipated DC date:  2 to 4 days  Anticipated DC barriers: None  Consults called:  None Admission status:  Inpatient, stepdown  Severity of Illness: The appropriate patient status for this patient is INPATIENT. Inpatient status is judged to be reasonable and necessary in order to provide the required intensity of service  to ensure the patient's safety. The patient's presenting symptoms, physical exam findings, and initial radiographic and laboratory data in the context of their chronic comorbidities is felt to place them at high risk for further clinical deterioration. Furthermore, it is not anticipated that the patient will be medically stable for discharge from the hospital within 2 midnights of admission.   * I certify that at the point of admission it is my clinical judgment that the patient will require inpatient hospital care spanning beyond 2 midnights from the point of admission due to high intensity of service, high risk for further deterioration and high frequency of surveillance required.Marcelyn Bruins MD [facility]  How to contact the [facility] Attending or Consulting provider [place] or covering provider during after hours [place], for this patient?   Check the care team in [facility] and look for a) attending/consulting [facility] provider listed and b) the [facility] team listed Log into [email or web address] and use [place]'s universal password to access. If you do not have the password, please contact the hospital operator. Locate the [facility] provider you are looking for under [facility] and page to a number that you can be directly reached. If you still have difficulty reaching the provider, please page the [facility] (Director on Call) for the Hospitalists listed on amion for assistance.  10/06/2021, 11:52 PM

## 2021-10-06 ENCOUNTER — Inpatient Hospital Stay: Payer: Medicare PPO

## 2021-10-06 DIAGNOSIS — J9601 Acute respiratory failure with hypoxia: Secondary | ICD-10-CM | POA: Diagnosis not present

## 2021-10-06 LAB — RESPIRATORY PANEL BY PCR

## 2021-10-06 LAB — COMPREHENSIVE METABOLIC PANEL
ALT: 10 U/L (ref 0–44)
AST: 26 U/L (ref 15–41)
Albumin: 3 g/dL — ABNORMAL LOW (ref 3.5–5.0)
Alkaline Phosphatase: 37 U/L — ABNORMAL LOW (ref 38–126)
Anion gap: 11 (ref 5–15)
BUN: 47 mg/dL — ABNORMAL HIGH (ref 8–23)
CO2: 20 mmol/L — ABNORMAL LOW (ref 22–32)
Calcium: 8.3 mg/dL — ABNORMAL LOW (ref 8.9–10.3)
Chloride: 106 mmol/L (ref 98–111)
Creatinine, Ser: 1.68 mg/dL — ABNORMAL HIGH (ref 0.44–1.00)
GFR, Estimated: 29 mL/min — ABNORMAL LOW (ref >60)
Glucose, Bld: 137 mg/dL — ABNORMAL HIGH (ref 70–99)
Potassium: 4.5 mmol/L (ref 3.5–5.1)
Sodium: 137 mmol/L (ref 135–145)
Total Bilirubin: 1.1 mg/dL (ref 0.3–1.2)
Total Protein: 6 g/dL — ABNORMAL LOW (ref 6.5–8.1)

## 2021-10-06 LAB — GLUCOSE, CAPILLARY
Glucose-Capillary: 118 mg/dL — ABNORMAL HIGH (ref 70–99)
Glucose-Capillary: 79 mg/dL (ref 70–99)
Glucose-Capillary: 91 mg/dL (ref 70–99)

## 2021-10-06 LAB — BLOOD GAS, ARTERIAL
Acid-base deficit: 5.1 mmol/L — ABNORMAL HIGH (ref 0.0–2.0)
Acid-base deficit: 7 mmol/L — ABNORMAL HIGH (ref 0.0–2.0)
Bicarbonate: 22 mmol/L (ref 20.0–28.0)
Bicarbonate: 19.7 mmol/L — ABNORMAL LOW (ref 20.0–28.0)
FIO2: 100 %
FIO2: 100 %
MECHVT: 440 mL
MECHVT: 440 mL
Mechanical Rate: 18
Mechanical Rate: 18
O2 Saturation: 89.5 %
O2 Saturation: 99.1 %
PEEP: 10 cmH2O
PEEP: 10 cmH2O
Patient temperature: 37
Patient temperature: 37
pCO2 arterial: 48 mmHg (ref 32–48)
pCO2 arterial: 43 mmHg (ref 32–48)
pH, Arterial: 7.27 — ABNORMAL LOW (ref 7.35–7.45)
pH, Arterial: 7.27 — ABNORMAL LOW (ref 7.35–7.45)
pO2, Arterial: 63 mmHg — ABNORMAL LOW (ref 83–108)
pO2, Arterial: 99 mmHg (ref 83–108)

## 2021-10-06 LAB — LACTIC ACID, PLASMA
Lactic Acid, Venous: 2.3 mmol/L (ref 0.5–1.9)
Lactic Acid, Venous: 2.5 mmol/L (ref 0.5–1.9)
Lactic Acid, Venous: 2.8 mmol/L (ref 0.5–1.9)
Lactic Acid, Venous: 4.1 mmol/L (ref 0.5–1.9)

## 2021-10-06 LAB — URINALYSIS, ROUTINE W REFLEX MICROSCOPIC
Bilirubin Urine: NEGATIVE
Glucose, UA: NEGATIVE mg/dL
Hgb urine dipstick: NEGATIVE
Ketones, ur: NEGATIVE mg/dL
Nitrite: NEGATIVE
Protein, ur: 100 mg/dL — AB
Specific Gravity, Urine: 1.019 (ref 1.005–1.030)
WBC, UA: >50 WBC/hpf — ABNORMAL HIGH (ref 0–5)
pH: 5 (ref 5.0–8.0)

## 2021-10-06 LAB — CBC
HCT: 50.8 % — ABNORMAL HIGH (ref 36.0–46.0)
Hemoglobin: 16.6 g/dL — ABNORMAL HIGH (ref 12.0–15.0)
MCH: 29.9 pg (ref 26.0–34.0)
MCHC: 32.7 g/dL (ref 30.0–36.0)
MCV: 91.4 fL (ref 80.0–100.0)
Platelets: 284 10*3/uL (ref 150–400)
RBC: 5.56 MIL/uL — ABNORMAL HIGH (ref 3.87–5.11)
RDW: 14 % (ref 11.5–15.5)
WBC: 5.4 10*3/uL (ref 4.0–10.5)
nRBC: 0 % (ref 0.0–0.2)

## 2021-10-06 LAB — TROPONIN I (HIGH SENSITIVITY): Troponin I (High Sensitivity): 13 ng/L (ref <18)

## 2021-10-06 LAB — STREP PNEUMONIAE URINARY ANTIGEN: Strep Pneumo Urinary Antigen: NEGATIVE

## 2021-10-06 LAB — PROCALCITONIN: Procalcitonin: 60.58 ng/mL

## 2021-10-06 LAB — MRSA NEXT GEN BY PCR, NASAL: MRSA by PCR Next Gen: NOT DETECTED

## 2021-10-06 MED ORDER — ROCURONIUM BROMIDE 10 MG/ML (PF) SYRINGE
PREFILLED_SYRINGE | INTRAVENOUS | Status: AC
  Administered 2021-10-06: 50 mg via INTRAVENOUS
  Filled 2021-10-06: qty 10

## 2021-10-06 MED ORDER — IPRATROPIUM BROMIDE 0.02 % IN SOLN
0.5000 mg | Freq: Four times a day (QID) | RESPIRATORY_TRACT | Status: DC
Start: 2021-10-06 — End: 2021-10-09
  Administered 2021-10-06 – 2021-10-08 (×9): 0.5 mg via RESPIRATORY_TRACT
  Filled 2021-10-06 (×10): qty 2.5

## 2021-10-06 MED ORDER — FENTANYL CITRATE PF 50 MCG/ML IJ SOSY: 50.0000 ug | PREFILLED_SYRINGE | Freq: Once | INTRAMUSCULAR | Status: AC

## 2021-10-06 MED ORDER — METHYLPREDNISOLONE SODIUM SUCC 40 MG IJ SOLR
40.0000 mg | Freq: Every day | INTRAMUSCULAR | Status: DC
  Administered 2021-10-06 – 2021-10-10 (×5): 40 mg via INTRAVENOUS
  Filled 2021-10-06 (×5): qty 1

## 2021-10-06 MED ORDER — LEVALBUTEROL HCL 1.25 MG/0.5ML IN NEBU
1.2500 mg | INHALATION_SOLUTION | Freq: Four times a day (QID) | RESPIRATORY_TRACT | Status: DC
Start: 2021-10-06 — End: 2021-10-08
  Administered 2021-10-06 – 2021-10-08 (×9): 1.25 mg via RESPIRATORY_TRACT
  Filled 2021-10-06 (×9): qty 0.5

## 2021-10-06 MED ORDER — ORAL CARE MOUTH RINSE
15.0000 mL | OROMUCOSAL | Status: DC | PRN
Start: 2021-10-06 — End: 2021-10-11

## 2021-10-06 MED ORDER — ROCURONIUM BROMIDE 10 MG/ML (PF) SYRINGE: 50.0000 mg | PREFILLED_SYRINGE | Freq: Once | INTRAVENOUS | Status: AC

## 2021-10-06 MED ORDER — CHLORHEXIDINE GLUCONATE CLOTH 2 % EX PADS
6.0000 | MEDICATED_PAD | Freq: Every day | CUTANEOUS | Status: DC
  Administered 2021-10-06 – 2021-10-09 (×4): 6 via TOPICAL

## 2021-10-06 MED ORDER — NOREPINEPHRINE 4 MG/250ML-% IV SOLN
2.0000 ug/min | INTRAVENOUS | Status: DC
  Administered 2021-10-06 (×2): 2 ug/min via INTRAVENOUS

## 2021-10-06 MED ORDER — MIDAZOLAM HCL 2 MG/2ML IJ SOLN: 1.0000 mg | INTRAMUSCULAR | Status: DC | PRN

## 2021-10-06 MED ORDER — FENTANYL BOLUS VIA INFUSION: 25.0000 ug | INTRAVENOUS | Status: DC | PRN

## 2021-10-06 MED ORDER — MORPHINE SULFATE (PF) 2 MG/ML IV SOLN
2.0000 mg | Freq: Once | INTRAVENOUS | Status: AC
  Administered 2021-10-06: 2 mg via INTRAVENOUS
  Filled 2021-10-06: qty 1

## 2021-10-06 MED ORDER — ETOMIDATE 2 MG/ML IV SOLN
INTRAVENOUS | Status: AC
  Administered 2021-10-06: 20 mg via INTRAVENOUS
  Filled 2021-10-06: qty 10

## 2021-10-06 MED ORDER — FENTANYL CITRATE PF 50 MCG/ML IJ SOSY
PREFILLED_SYRINGE | INTRAMUSCULAR | Status: AC
  Administered 2021-10-06: 50 ug via INTRAVENOUS
  Filled 2021-10-06: qty 1

## 2021-10-06 MED ORDER — ORAL CARE MOUTH RINSE
15.0000 mL | OROMUCOSAL | Status: DC
  Administered 2021-10-06: 15 mL via OROMUCOSAL

## 2021-10-06 MED ORDER — SODIUM CHLORIDE 0.9 % IV SOLN
250.0000 mL | INTRAVENOUS | Status: DC
  Administered 2021-10-06 (×2): 250 mL via INTRAVENOUS

## 2021-10-06 MED ORDER — POLYETHYLENE GLYCOL 3350 17 G PO PACK: 17.0000 g | PACK | Freq: Every day | ORAL | Status: DC

## 2021-10-06 MED ORDER — BUDESONIDE 0.5 MG/2ML IN SUSP
0.5000 mg | Freq: Two times a day (BID) | RESPIRATORY_TRACT | Status: DC
  Administered 2021-10-06 – 2021-10-08 (×5): 0.5 mg via RESPIRATORY_TRACT
  Filled 2021-10-06 (×5): qty 2

## 2021-10-06 MED ORDER — ORAL CARE MOUTH RINSE
15.0000 mL | OROMUCOSAL | Status: DC
  Administered 2021-10-06 – 2021-10-10 (×36): 15 mL via OROMUCOSAL

## 2021-10-06 MED ORDER — LACTATED RINGERS IV BOLUS
1000.0000 mL | Freq: Once | INTRAVENOUS | Status: AC
  Administered 2021-10-06: 1000 mL via INTRAVENOUS

## 2021-10-06 MED ORDER — FENTANYL 2500MCG IN NS 250ML (10MCG/ML) PREMIX INFUSION
25.0000 ug/h | INTRAVENOUS | Status: DC
  Administered 2021-10-06: 25 ug/h via INTRAVENOUS
  Administered 2021-10-07: 100 ug/h via INTRAVENOUS
  Filled 2021-10-06 (×2): qty 250

## 2021-10-06 MED ORDER — DOCUSATE SODIUM 50 MG/5ML PO LIQD: 100.0000 mg | Freq: Two times a day (BID) | ORAL | Status: DC

## 2021-10-06 MED ORDER — INSULIN ASPART 100 UNIT/ML IJ SOLN
0.0000 [IU] | INTRAMUSCULAR | Status: DC
  Administered 2021-10-09: 8 [IU] via SUBCUTANEOUS
  Administered 2021-10-09: 5 [IU] via SUBCUTANEOUS
  Administered 2021-10-10 (×3): 2 [IU] via SUBCUTANEOUS
  Filled 2021-10-06 (×7): qty 1

## 2021-10-06 MED ORDER — ETOMIDATE 2 MG/ML IV SOLN: 20.0000 mg | Freq: Once | INTRAVENOUS | Status: AC

## 2021-10-06 NOTE — Consult Note (Signed)
NAME:  Alexandria Roman, MRN:  [id number], DOB:  1929-08-16, LOS: 1 ADMISSION DATE:  10/15/2021, CONSULTATION DATE:  10/06/21 REFERRING MD:  Dr. Posey Pronto, CHIEF COMPLAINT:  Acute Respiratory Distress   Brief Pt Description / Synopsis:  88 y.o. female admitted with Severe Sepsis and Acute Hypoxic Respiratory Failure in the setting of Aspiration Pneumonia, failed trial of BiPAP requiring intubation and mechanical ventilation.  History of Present Illness:  Alexandria Roman is a 88 year old female with a past medical history significant for COPD, hypertension, CKD stage III, chronic pain, hypothyroidism, hyperlipidemia, depression, anxiety who presented to [facility] ED on 10/05/2020 due to complaints of progressive shortness of breath.  Patient is currently unable to contribute to history due to acute respiratory distress and BiPAP, therefore history is obtained from chart review.  She reported she had an episode of vomiting after eating seafood on Sunday, which she then developed progressive shortness of breath.  She lives in the independent living facility and has nursing assistance for ADLs.  When her nurse checked on her she was noted to be tachypneic, of which EMS was dispatched.  Upon EMS arrival she was noted to be hypoxic with O2 sats in the 70s which she was placed on nasal cannula.  Upon arrival to the ED she was placed on BiPAP due to acute respiratory distress.  ED Course: Initial Vital Signs: Respiratory rate 20s, pulse 137, blood pressure 78/62, SPO2 100% on BiPAP Significant Labs: Glucose 247, BUN 39, creatinine 1.61, high-sensitivity troponin 13, lactic acid 4.9, WBC 8.9, hemoglobin 18.8, hematocrit 60.5 VBG on nonrebreather: pH 7.26/PCO2 58/PO2 less than 31/bicarb 26 COVID-19 PCR is negative Urinalysis is consistent with UTI (moderate leukocytes, WBC greater than 50) Imaging Chest X-ray>>IMPRESSION: 1. Bilateral lower lung field opacities may represent developing infiltrate or aspiration. 2.  Large hiatal hernia. Medications Administered: Azithromycin and ceftriaxone, Unasyn  Hospitalist were asked to admit for further work-up and treatment.  Please see "significant hospital events" section below for full detailed hospital course.  Pertinent  Medical History   Past Medical History:  Diagnosis Date   Cancer ([place])    Hypertension     Micro Data:  8/21: COVID-19 PCR>>negative 8/21: Blood culture x2>> 8/21: Urine>> 8/21: Strep pneumo urinary antigen>> 8/21: Legionella urinary antigen>>  Antimicrobials:  Azithromycin 8/21 x1 dose Ceftriaxone 8/21 x1 dose Unasyn 8/21>>  Significant Hospital Events: Including procedures, antibiotic start and stop dates in addition to other pertinent events   8/22: Admitted by Hospitalist.  Requiring BiPAP 8/23: Persistent respiratory distress despite BiPAP.  PCCM consulted, emergently intubated.  Discussion with son, code status changed to DNR.  Interim History / Subjective:  -Patient admitted previously by hospitalist due to respiratory failure requiring BiPAP -This morning despite maximal BiPAP settings, patient remains with respiratory distress ~concern for impending respiratory arrest -Dr. Mortimer Fries discussed with patient's son, would like to proceed with intubation and change CODE STATUS to DNR -We will proceed with emergent intubation  Objective   Blood pressure 119/80, pulse (!) 127, temperature 97.6 F (36.4 C), temperature source Axillary, resp. rate (!) 31, height $RemoveBe'5\' 4"'[id number]$  (1.626 m), weight 69 kg, SpO2 90 %.    FiO2 (%):  [100 %] 100 %  No intake or output data in the 24 hours ending 10/06/21 1059 Filed Weights   10/14/2021 2119  Weight: 69 kg    Examination: General: Acute on chronically ill-appearing female, laying in bed, on BiPAP with acute respiratory distress HENT: Atraumatic, normocephalic, neck supple, positive JVD Lungs: Coarse breath sounds  bilaterally, tachypneic, increased work of breathing despite  BiPAP Cardiovascular: Tachycardia, regular rate and rhythm, S1-S2, no murmurs, rubs, gallops Abdomen: Soft, nontender, nondistended, no guarding rebound tenderness, bowel sounds positive x4 Extremities: No deformities, trace edema bilaterally Neuro: Awake and alert, difficult to assess orientation due to BiPAP and respiratory distress, moves all extremities to command, no focal deficits noted, pupils PERRLA GU: Deferred  Resolved Hospital Problem list     Assessment & Plan:   #Acute Hypoxic Respiratory Failure due to Aspiration -Full vent support, implement lung protective strategies -Plateau pressures less than 30 cm H20 -Wean FiO2 & PEEP as tolerated to maintain O2 sats >92% -Follow intermittent Chest X-ray & ABG as needed -Spontaneous Breathing Trials when respiratory parameters met and mental status permits -Implement VAP Bundle -Bronchodilators & Pulmicort nebs -Antibiotics as above -IV steroids  #Severe Sepsis due to Aspiration Pneumonia & UTI (Met SIRS Criteria on admission: HR >90, RR >20, lactic 4.9)  -Monitor fever curve -Trend WBC's & Procalcitonin -Follow cultures as above -Continue empiric Unasyn pending cultures & sensitivities  #Tachycardia due to Sepsis #Hypotension: developing septic shock vs. Sedation related (was not hypotensive until after intubation and sedation initiated) PMHx: Hypertension, Hyperlipidemia -Continuous cardiac monitoring -Maintain MAP >65 -IV fluids -Vasopressors as needed to maintain MAP goal -Trend lactic acid until normalized (4.9 ~ 4.1 ~ ) -HS Troponin negative x2 (13 ~ 13) -Consider Echocardiogram   #AKI on CKD Stage IIIa #Mildly elevated AG Metabolic Acidosis in setting of Lactic Acidosis -Monitor I&O's / urinary output -Follow BMP -Ensure adequate renal perfusion -Avoid nephrotoxic agents as able -Replace electrolytes as indicated -IV fluids -Trend lactic acid  #Hypothyroidism  -Continue home synthroid -Follow  TSH  #Hyperglycemia -CBG's q4h; Target range of 140 to 180 -SSI -Follow ICU Hypo/Hyperglycemia protocol -Check Hgb I7T  #Acute Metabolic Encephalopathy #Sedation needs in the setting of mechanical ventilation PMHx: anxiety, depression -Maintain a RASS goal of 0 to -11 -Fentanyl as needed to maintain RASS goal -Avoid sedating medications as able -Daily wake up assessment    Patient is critically ill, gnosis guarded.  High risk for further decompensation, cardiac arrest and death.  CODE STATUS changed to DNR by patient's son.   Best Practice (right click and "Reselect all SmartList Selections" daily)   Diet/type: NPO DVT prophylaxis: LMWH GI prophylaxis: PPI Lines: Central line Foley:  Yes, and it is still needed Code Status:  DNR Last date of multidisciplinary goals of care discussion [N/A]  Patient's son was updated by Dr. Mortimer Fries 8/22.  Labs   CBC: Recent Labs  Lab 10/03/2021 2154 10/06/21 0536  WBC 8.9 5.4  HGB 18.8* 16.6*  HCT 60.5* 50.8*  MCV 93.4 91.4  PLT 369 245    Basic Metabolic Panel: Recent Labs  Lab 10/12/2021 2154 10/06/21 0536  NA 135 137  K 4.9 4.5  CL 98 106  CO2 22 20*  GLUCOSE 247* 137*  BUN 39* 47*  CREATININE 1.61* 1.68*  CALCIUM 9.4 8.3*   GFR: Estimated Creatinine Clearance: 21.6 mL/min (A) (by C-G formula based on SCr of 1.68 mg/dL (H)). Recent Labs  Lab 09/26/2021 2154 09/24/2021 2337 10/06/21 0536  WBC 8.9  --  5.4  LATICACIDVEN 4.9* 4.1*  --     Liver Function Tests: Recent Labs  Lab 10/12/2021 2154 10/06/21 0536  AST 27 26  ALT 11 10  ALKPHOS 61 37*  BILITOT 1.1 1.1  PROT 7.5 6.0*  ALBUMIN 3.8 3.0*   Recent Labs  Lab 10/07/2021 2154  LIPASE  27   No results for input(s): "AMMONIA" in the last 168 hours.  ABG    Component Value Date/Time   HCO3 26.0 10/02/2021 2154   ACIDBASEDEF 2.1 (H) 09/24/2021 2154   O2SAT 24.6 09/21/2021 2154     Coagulation Profile: No results for input(s): "INR", "PROTIME" in the last  168 hours.  Cardiac Enzymes: No results for input(s): "CKTOTAL", "CKMB", "CKMBINDEX", "TROPONINI" in the last 168 hours.  HbA1C: No results found for: "HGBA1C"  CBG: No results for input(s): "GLUCAP" in the last 168 hours.  Review of Systems:   Unable to assess due to respiratory distress and BiPAP   Past Medical History:  She,  has a past medical history of Cancer (HCC) and Hypertension.   Surgical History:   Past Surgical History:  Procedure Laterality Date   ABDOMINAL HYSTERECTOMY     HIP ARTHROPLASTY Right 05/20/2018   Procedure: ARTHROPLASTY BIPOLAR HIP (HEMIARTHROPLASTY);  Surgeon: Juanell Fairly, MD;  Location: [facility] ORS;  Service: Orthopedics;  Laterality: Right;     Social History:   reports that she has never smoked. She has never used smokeless tobacco. She reports that she does not drink alcohol and does not use drugs.   Family History:  Her family history includes Testicular cancer in her father.   Allergies Allergies  Allergen Reactions   Amoxicillin-Pot Clavulanate Diarrhea   Cephalexin Other (See Comments) and Nausea And Vomiting   Hydrochlorothiazide Other (See Comments) and Nausea And Vomiting   Hydrocodone Bit-Homatrop Mbr Other (See Comments)   Hydrocodone-Chlorpheniramine Other (See Comments)   Metronidazole Other (See Comments)    Other reaction(s): Other (See Comments) Other Reaction: Not Assessed Other Reaction: Not Assessed   Moxifloxacin Other (See Comments)   Prednisone Nausea Only   Simvastatin Other (See Comments)   Sulfa Antibiotics Other (See Comments)    Other reaction(s): Unknown   Sulfasalazine Other (See Comments)    Other reaction(s): Unknown   Tramadol Other (See Comments)   Celecoxib Nausea And Vomiting   Codeine Nausea Only    Other reaction(s): Other (See Comments) Other reaction(s): Unknown   Cyclobenzaprine Other (See Comments) and Nausea Only    Other reaction(s): Other (See Comments)   Oxycodone-Acetaminophen Nausea  Only and Nausea And Vomiting   Penicillin V Potassium Rash    Other reaction(s): Other (See Comments) Other reaction(s): Unknown     Home Medications  Prior to Admission medications   Medication Sig Start Date End Date Taking? Authorizing Provider  calcitonin, salmon, (MIACALCIN/FORTICAL) 200 UNIT/ACT nasal spray Place 1 spray into alternate nostrils daily. 04/23/20  Yes Marrion Coy, MD  Cranberry 450 MG CAPS Take 450 mg by mouth daily.   Yes [provider]  DULoxetine (CYMBALTA) 30 MG capsule Take 30 mg by mouth daily.   Yes [provider]  levothyroxine (SYNTHROID) 50 MCG tablet Take 50 mcg by mouth daily. 06/12/20  Yes [provider]  Multiple Vitamins-Minerals (PRESERVISION/LUTEIN) CAPS Take 1 capsule by mouth 2 (two) times daily.   Yes [provider]  timolol (TIMOPTIC) 0.5 % ophthalmic solution Place 1 drop into both eyes daily. 12/07/19  Yes [provider]  gabapentin (NEURONTIN) 400 MG capsule Take 400 mg by mouth at bedtime.    [provider]  latanoprost (XALATAN) 0.005 % ophthalmic solution Place 1 drop into both eyes at bedtime.    [provider]  polyethylene glycol (MIRALAX / GLYCOLAX) 17 g packet Take 17 g by mouth daily as needed for mild constipation. 08/09/20  Jennye Boroughs, MD  amLODipine (NORVASC) 2.5 MG tablet Take 1 tablet (2.5 mg total) by mouth daily. 08/09/20 08/10/20  Jennye Boroughs, MD  enoxaparin (LOVENOX) 40 MG/0.4ML injection Inject 0.4 mLs (40 mg total) into the skin daily for 28 days. 05/24/18 05/13/19  Demetrios Loll, MD  losartan (COZAAR) 25 MG tablet Take 1 tablet (25 mg total) by mouth daily. 05/23/18 05/13/19  Demetrios Loll, MD  metoprolol succinate (TOPROL-XL) 25 MG 24 hr tablet Take 1 tablet by mouth daily. 03/21/19 01/28/20  [provider]     Critical care time: 50 minutes     Darel Hong, AGACNP-BC [facility] epic messenger for cross cover needs If  after hours, please call E-link

## 2021-10-06 NOTE — Procedures (Signed)
Intubation Procedure Note  JALYSSA FLEISHER  [id number]  1930-02-07  Date:10/06/21  Time:11:27 AM   Provider Performing:Zyrion Coey D Dewaine Conger    Procedure: Intubation (88416)  Indication(s) Respiratory Failure  Consent Risks of the procedure as well as the alternatives and risks of each were explained to the patient and/or caregiver.  Consent for the procedure was obtained and is signed in the bedside chart   Anesthesia Etomidate, Fentanyl, and Rocuronium   Time Out Verified patient identification, verified procedure, site/side was marked, verified correct patient position, special equipment/implants available, medications/allergies/relevant history reviewed, required imaging and test results available.   Sterile Technique Usual hand hygeine, masks, and gloves were used   Procedure Description Patient positioned in bed supine.  Sedation given as noted above.  Patient was intubated with endotracheal tube using Glidescope.  View was Grade 1 full glottis .  Number of attempts was 1.  Colorimetric CO2 detector was consistent with tracheal placement.   Complications/Tolerance None; patient tolerated the procedure well. Chest X-ray is ordered to verify placement.   EBL Minimal   Specimen(s) None   Size 7.5 ETT Secured at 21 cm at lip  Darel Hong, AGACNP-BC [place] epic messenger for cross cover needs If after hours, please call E-link

## 2021-10-06 NOTE — Procedures (Signed)
Central Venous Catheter Insertion Procedure Note  MURDIS FLITTON  [id number]  March 29, 1929  Date:10/06/21  Time:12:05 PM   Provider Performing:Katlyn Muldrew D Dewaine Conger   Procedure: Insertion of Non-tunneled Central Venous [id number]) with US guidance (79024)   Indication(s) Medication administration and Difficult access  Consent Risks of the procedure as well as the alternatives and risks of each were explained to the patient and/or caregiver.  Consent for the procedure was obtained and is signed in the bedside chart  Anesthesia Topical only with 1% lidocaine   Timeout Verified patient identification, verified procedure, site/side was marked, verified correct patient position, special equipment/implants available, medications/allergies/relevant history reviewed, required imaging and test results available.  Sterile Technique Maximal sterile technique including full sterile barrier drape, hand hygiene, sterile gown, sterile gloves, mask, hair covering, sterile ultrasound probe cover (if used).  Procedure Description Area of catheter insertion was cleaned with chlorhexidine and draped in sterile fashion.  With real-time ultrasound guidance a central venous catheter was placed into the left internal jugular vein. Nonpulsatile blood flow and easy flushing noted in all ports.  The catheter was sutured in place and sterile dressing applied.  Complications/Tolerance None; patient tolerated the procedure well. Chest X-ray is ordered to verify placement for internal jugular or subclavian cannulation.   Chest x-ray is not ordered for femoral cannulation.  EBL Minimal  Specimen(s) None   Line secured at the 19 cm mark. BIOPATCH applied to the insertion site.   Darel Hong, AGACNP-BC [facility] Prefer epic messenger for cross cover needs If after hours, please call E-link

## 2021-10-06 NOTE — ED Notes (Signed)
Report given to ICU, no change in pt condition.

## 2021-10-06 NOTE — ED Notes (Signed)
Assumed care, pt linens changed, dressing to bottom placed. Pt noted to by tachy 110-120, RR 30's saturation 90-92% , RT called states well come to check on settings. Pt reoriented to place. Bed Alarm on. Message to MD to advise of HR and current condition. Will monitor.

## 2021-10-06 NOTE — ED Notes (Signed)
Caregiver at bedside. Pt alert to her. No needs at this time.

## 2021-10-06 NOTE — ED Notes (Signed)
MD aware of Pt's current VS and condition.

## 2021-10-06 NOTE — Progress Notes (Signed)
Darel Hong, NP notified of UOP of 60 mL total since 1015 today.  CVP obtained per NP request.  CVP 3.

## 2021-10-06 NOTE — Procedures (Signed)
Arterial Catheter Insertion Procedure Note  Alexandria Roman  [id number]  1929/10/27  Date:10/06/21  Time:4:08 PM    Provider Performing: Bradly Bienenstock    Procedure: Insertion of Arterial Line [phone]) with US guidance ([id number])   Indication(s) Blood pressure monitoring and/or need for frequent ABGs  Consent Risks of the procedure as well as the alternatives and risks of each were explained to the patient and/or caregiver.  Consent for the procedure was obtained and is signed in the bedside chart  Anesthesia None   Time Out Verified patient identification, verified procedure, site/side was marked, verified correct patient position, special equipment/implants available, medications/allergies/relevant history reviewed, required imaging and test results available.   Sterile Technique Maximal sterile technique including full sterile barrier drape, hand hygiene, sterile gown, sterile gloves, mask, hair covering, sterile ultrasound probe cover (if used).   Procedure Description Area of catheter insertion was cleaned with chlorhexidine and draped in sterile fashion. With real-time ultrasound guidance an arterial catheter was placed into the left femoral artery.  Appropriate arterial tracings confirmed on monitor.     Complications/Tolerance None; patient tolerated the procedure well.   EBL Minimal   Specimen(s) None   BIOPATCH applied to the insertion site.   Darel Hong, AGACNP-BC [facility] Prefer epic messenger for cross cover needs If after hours, please call E-link

## 2021-10-06 NOTE — IPAL (Signed)
  Interdisciplinary Goals of Care Family Meeting   Date carried out: 10/06/2021  Location of the meeting: Phone conference  Member's involved: Physician, Nurse Practitioner, and Family Member or next of kin    GOALS OF CARE DISCUSSION  The Clinical status was relayed to family in [place] at [phone]  Updated and notified of patients medical condition- Patient remains unresponsive and will not open eyes to command.   Patient is having a weak cough and struggling to remove secretions.   Patient with increased WOB and using accessory muscles to breathe Explained to family course of therapy and the modalities   Patient with Progressive multiorgan failure with a very high probablity of a very minimal chance of meaningful recovery despite all aggressive and optimal medical therapy.    Family understands the situation.  Son has consented and agreed to DNR status. Permission and verbal consent obtain for central line and bronchoscopy if needed  Family are satisfied with Plan of action and management. All questions answered  Additional CC time 35 mins   Lorraine Terriquez Patricia Pesa, M.D.  [facility]  Medical Director [place] Director [facility] Cardio-Pulmonary Department

## 2021-10-06 NOTE — ED Notes (Signed)
MD to bedside.

## 2021-10-07 DIAGNOSIS — J9601 Acute respiratory failure with hypoxia: Secondary | ICD-10-CM | POA: Diagnosis not present

## 2021-10-07 DIAGNOSIS — E440 Moderate protein-calorie malnutrition: Secondary | ICD-10-CM | POA: Insufficient documentation

## 2021-10-07 LAB — URINE CULTURE: Culture: NO GROWTH

## 2021-10-07 LAB — GLUCOSE, CAPILLARY
Glucose-Capillary: 105 mg/dL — ABNORMAL HIGH (ref 70–99)
Glucose-Capillary: 107 mg/dL — ABNORMAL HIGH (ref 70–99)
Glucose-Capillary: 107 mg/dL — ABNORMAL HIGH (ref 70–99)
Glucose-Capillary: 109 mg/dL — ABNORMAL HIGH (ref 70–99)
Glucose-Capillary: 97 mg/dL (ref 70–99)
Glucose-Capillary: 97 mg/dL (ref 70–99)

## 2021-10-07 LAB — CBC
HCT: 41.1 % (ref 36.0–46.0)
Hemoglobin: 12.9 g/dL (ref 12.0–15.0)
MCH: 29.4 pg (ref 26.0–34.0)
MCHC: 31.4 g/dL (ref 30.0–36.0)
MCV: 93.6 fL (ref 80.0–100.0)
Platelets: 233 10*3/uL (ref 150–400)
RBC: 4.39 MIL/uL (ref 3.87–5.11)
RDW: 14.4 % (ref 11.5–15.5)
WBC: 16.8 10*3/uL — ABNORMAL HIGH (ref 4.0–10.5)
nRBC: 0 % (ref 0.0–0.2)

## 2021-10-07 LAB — BLOOD GAS, ARTERIAL
Acid-base deficit: 6.6 mmol/L — ABNORMAL HIGH (ref 0.0–2.0)
Bicarbonate: 19.1 mmol/L — ABNORMAL LOW (ref 20.0–28.0)
FIO2: 40 %
MECHVT: 440 mL
Mechanical Rate: 18
O2 Saturation: 97.6 %
PEEP: 8 cmH2O
Patient temperature: 37
pCO2 arterial: 38 mmHg (ref 32–48)
pH, Arterial: 7.31 — ABNORMAL LOW (ref 7.35–7.45)
pO2, Arterial: 79 mmHg — ABNORMAL LOW (ref 83–108)

## 2021-10-07 LAB — BASIC METABOLIC PANEL
Anion gap: 9 (ref 5–15)
BUN: 64 mg/dL — ABNORMAL HIGH (ref 8–23)
CO2: 23 mmol/L (ref 22–32)
Calcium: 7.7 mg/dL — ABNORMAL LOW (ref 8.9–10.3)
Chloride: 108 mmol/L (ref 98–111)
Creatinine, Ser: 2.2 mg/dL — ABNORMAL HIGH (ref 0.44–1.00)
GFR, Estimated: 21 mL/min — ABNORMAL LOW (ref >60)
Glucose, Bld: 113 mg/dL — ABNORMAL HIGH (ref 70–99)
Potassium: 4.8 mmol/L (ref 3.5–5.1)
Sodium: 140 mmol/L (ref 135–145)

## 2021-10-07 LAB — LEGIONELLA PNEUMOPHILA SEROGP 1 UR AG: L. pneumophila Serogp 1 Ur Ag: NEGATIVE

## 2021-10-07 LAB — PROCALCITONIN: Procalcitonin: 71.05 ng/mL

## 2021-10-07 LAB — HEMOGLOBIN A1C
Hgb A1c MFr Bld: 5.6 % (ref 4.8–5.6)
Mean Plasma Glucose: 114.02 mg/dL

## 2021-10-07 NOTE — Progress Notes (Signed)
NAME:  Alexandria Roman, MRN:  [id number], DOB:  26-Oct-1929, LOS: 2 ADMISSION DATE:  10/04/2021, CONSULTATION DATE:  10/06/21 REFERRING MD:  Dr. Posey Pronto, CHIEF COMPLAINT:  Acute Respiratory Distress   Brief Pt Description / Synopsis:  88 y.o. female admitted with Severe Sepsis and Acute Hypoxic Respiratory Failure in the setting of Aspiration Pneumonia, failed trial of BiPAP requiring intubation and mechanical ventilation.  History of Present Illness:  Alexandria Roman is a 88 year old female with a past medical history significant for COPD, hypertension, CKD stage III, chronic pain, hypothyroidism, hyperlipidemia, depression, anxiety who presented to [facility] ED on 10/05/2020 due to complaints of progressive shortness of breath.  Patient is currently unable to contribute to history due to acute respiratory distress and BiPAP, therefore history is obtained from chart review.  She reported she had an episode of vomiting after eating seafood on Sunday, which she then developed progressive shortness of breath.  She lives in the independent living facility and has nursing assistance for ADLs.  When her nurse checked on her she was noted to be tachypneic, of which EMS was dispatched.  Upon EMS arrival she was noted to be hypoxic with O2 sats in the 70s which she was placed on nasal cannula.  Upon arrival to the ED she was placed on BiPAP due to acute respiratory distress.  ED Course: Initial Vital Signs: Respiratory rate 20s, pulse 137, blood pressure 78/62, SPO2 100% on BiPAP Significant Labs: Glucose 247, BUN 39, creatinine 1.61, high-sensitivity troponin 13, lactic acid 4.9, WBC 8.9, hemoglobin 18.8, hematocrit 60.5 VBG on nonrebreather: pH 7.26/PCO2 58/PO2 less than 31/bicarb 26 COVID-19 PCR is negative Urinalysis is consistent with UTI (moderate leukocytes, WBC greater than 50) Imaging Chest X-ray>>IMPRESSION: 1. Bilateral lower lung field opacities may represent developing infiltrate or aspiration. 2.  Large hiatal hernia. Medications Administered: Azithromycin and ceftriaxone, Unasyn  Hospitalist were asked to admit for further work-up and treatment.  Please see "significant hospital events" section below for full detailed hospital course.  Pertinent  Medical History   Past Medical History:  Diagnosis Date   Cancer ([place])    Hypertension     Micro Data:  8/21: COVID-19 PCR>>negative 8/21: Blood culture x2>>negative  8/21: Urine>> 8/21: Strep pneumo urinary antigen>>negative  8/23: Legionella urinary antigen>>  Antimicrobials:  Azithromycin 8/21 x1 dose Ceftriaxone 8/21 x1 dose Unasyn 8/21>>  Significant Hospital Events: Including procedures, antibiotic start and stop dates in addition to other pertinent events   8/22: Admitted by Hospitalist.  Requiring BiPAP 8/22: Persistent respiratory distress despite BiPAP.  PCCM consulted, emergently intubated.  Discussion with son, code status changed to DNR 8/23: Pt remains mechanically intubated vent settings weaned to 50%/PEEP 10, sedated with fentanyl gtt and following some commands  Interim History / Subjective:  As outlined above   Objective   Blood pressure 107/66, pulse (!) 102, temperature 97.9 F (36.6 C), resp. rate (!) 21, height _0  (1.626 m), weight 68.6 kg, SpO2 98 %. CVP:  [1 mmHg-15 mmHg] 8 mmHg  Vent Mode: PRVC FiO2 (%):  [50 %-100 %] 50 % Set Rate:  [18 bmp] 18 bmp Vt Set:  [440 mL] 440 mL PEEP:  [10 cmH20] 10 cmH20 Plateau Pressure:  [20 cmH20-21 cmH20] 20 cmH20   Intake/Output Summary (Last 24 hours) at 10/07/2021 0943 Last data filed at 10/07/2021 0900 Gross per 24 hour  Intake 2554.73 ml  Output 370 ml  Net 2184.73 ml   Filed Weights   10/09/2021 2119 10/07/21 0438  Weight: 69  kg 68.6 kg    Examination: General: Acute on chronically ill-appearing female, sedated NAD mechanically intubated  HENT: Supple, mild JVD present  Lungs: Diminished with faint rhonchi throughout, even, non labored   Cardiovascular: Sinus tachycardia, no r/g, 2+ radial/1+ distal pulses, no edema  Abdomen: +BS x4, soft, non distended,  Extremities: No deformities, trace edema bilaterally Neuro: Lightly sedated, following commands, PERRL GU: Indwelling catheter draining yellow urine  Resolved Hospital Problem list   Hypotension   Assessment & Plan:  Acute Hypoxic Respiratory Failure due to Aspiration - Full vent support, implement lung protective strategies - Plateau pressures less than 30 cm H20 - Wean FiO2 & PEEP as tolerated to maintain O2 sats >92% - Follow intermittent Chest X-ray & ABG as needed - Spontaneous Breathing Trials when respiratory parameters met and mental status permits - Implement VAP Bundle - IV and nebulized steroids   Severe Sepsis due to Aspiration Pneumonia & UTI (Met SIRS Criteria on admission: HR >90, RR >20, lactic 4.9)  - Trend WBC and monitor fever curve  - Trend PCT  - Follow cultures as above - Continue empiric Unasyn pending cultures & sensitivities  Tachycardia due to Sepsis PMHx: Hypertension, Hyperlipidemia - Continuous telemetry monitoring  - Maintain map >65   AKI on CKD Stage IIIa Lactic acidosis  - Trend BMP  - Replace electrolytes as indicated  - Monitor UOP - Avoid nephrotoxic medication s   Hypothyroidism  - Continue outpatient synthroid - Thyroid panel with TSH pending   Hyperglycemia - CBG's q4h; Target range of 140 to 180 - SSI - Follow ICU Hypo/Hyperglycemia protocol - Hgb A1c pending   Acute Metabolic Encephalopathy Sedation needs in the setting of mechanical ventilation PMHx: anxiety, depression - Maintain a RASS goal of 0 to -1 - Fentanyl as needed to maintain RASS goal - Daily wake up assessment  Best Practice (right click and "Reselect all SmartList Selections" daily)   Diet/type: NPO; if unable to extubate in the next 24hrs will start TF's  DVT prophylaxis: LMWH GI prophylaxis: PPI Lines: Central line; d/c left femoral  arterial line  Foley:  Yes, and it is still needed Code Status:  DNR Last date of multidisciplinary goals of care discussion [10/07/21]  Labs   CBC: Recent Labs  Lab 10/09/2021 2154 10/06/21 0536 10/07/21 0452  WBC 8.9 5.4 16.8*  HGB 18.8* 16.6* 12.9  HCT 60.5* 50.8* 41.1  MCV 93.4 91.4 93.6  PLT 369 284 001    Basic Metabolic Panel: Recent Labs  Lab 09/28/2021 2154 10/06/21 0536 10/07/21 0452  NA 135 137 140  K 4.9 4.5 4.8  CL 98 106 108  CO2 22 20* 23  GLUCOSE 247* 137* 113*  BUN 39* 47* 64*  CREATININE 1.61* 1.68* 2.20*  CALCIUM 9.4 8.3* 7.7*   GFR: Estimated Creatinine Clearance: 16.5 mL/min (A) (by C-G formula based on SCr of 2.2 mg/dL (H)). Recent Labs  Lab 09/16/2021 2154 09/25/2021 2337 10/06/21 0536 10/06/21 1325 10/06/21 1736 10/06/21 2002 10/07/21 0452  PROCALCITON  --   --   --  60.58  --   --  71.05  WBC 8.9  --  5.4  --   --   --  16.8*  LATICACIDVEN 4.9* 4.1*  --  2.3* 2.8* 2.5*  --     Liver Function Tests: Recent Labs  Lab 10/13/2021 2154 10/06/21 0536  AST 27 26  ALT 11 10  ALKPHOS 61 37*  BILITOT 1.1 1.1  PROT 7.5 6.0*  ALBUMIN  3.8 3.0*   Recent Labs  Lab 10/04/2021 2154  LIPASE 27   No results for input(s): "AMMONIA" in the last 168 hours.  ABG    Component Value Date/Time   PHART 7.27 (L) 10/06/2021 1624   PCO2ART 43 10/06/2021 1624   PO2ART 99 10/06/2021 1624   HCO3 19.7 (L) 10/06/2021 1624   ACIDBASEDEF 7.0 (H) 10/06/2021 1624   O2SAT 99.1 10/06/2021 1624     Coagulation Profile: No results for input(s): "INR", "PROTIME" in the last 168 hours.  Cardiac Enzymes: No results for input(s): "CKTOTAL", "CKMB", "CKMBINDEX", "TROPONINI" in the last 168 hours.  HbA1C: No results found for: "HGBA1C"  CBG: Recent Labs  Lab 10/06/21 1008 10/06/21 1933 10/06/21 2348 10/07/21 0326 10/07/21 0725  GLUCAP 118* 91 79 105* 107*    Review of Systems:   Unable to assess due to respiratory distress and BiPAP   Past Medical  History:  She,  has a past medical history of Cancer ([place]) and Hypertension.   Surgical History:   Past Surgical History:  Procedure Laterality Date   ABDOMINAL HYSTERECTOMY     HIP ARTHROPLASTY Right 05/20/2018   Procedure: ARTHROPLASTY BIPOLAR HIP (HEMIARTHROPLASTY);  Surgeon: Thornton Park, MD;  Location: [facility] ORS;  Service: Orthopedics;  Laterality: Right;     Social History:   reports that she has never smoked. She has never used smokeless tobacco. She reports that she does not drink alcohol and does not use drugs.   Family History:  Her family history includes Testicular cancer in her father.   Allergies Allergies  Allergen Reactions   Amoxicillin-Pot Clavulanate Diarrhea   Cephalexin Other (See Comments) and Nausea And Vomiting   Hydrochlorothiazide Other (See Comments) and Nausea And Vomiting   Hydrocodone Bit-Homatrop Mbr Other (See Comments)   Hydrocodone-Chlorpheniramine Other (See Comments)   Metronidazole Other (See Comments)    Other reaction(s): Other (See Comments) Other Reaction: Not Assessed Other Reaction: Not Assessed   Moxifloxacin Other (See Comments)   Prednisone Nausea Only   Simvastatin Other (See Comments)   Sulfa Antibiotics Other (See Comments)    Other reaction(s): Unknown   Sulfasalazine Other (See Comments)    Other reaction(s): Unknown   Tramadol Other (See Comments)   Celecoxib Nausea And Vomiting   Codeine Nausea Only    Other reaction(s): Other (See Comments) Other reaction(s): Unknown   Cyclobenzaprine Other (See Comments) and Nausea Only    Other reaction(s): Other (See Comments)   Oxycodone-Acetaminophen Nausea Only and Nausea And Vomiting   Penicillin V Potassium Rash    Other reaction(s): Other (See Comments) Other reaction(s): Unknown     Home Medications  Prior to Admission medications   Medication Sig Start Date End Date Taking? Authorizing Provider  calcitonin, salmon, (MIACALCIN/FORTICAL) 200 UNIT/ACT nasal spray Place  1 spray into alternate nostrils daily. 04/23/20  Yes Sharen Hones, MD  Cranberry 450 MG CAPS Take 450 mg by mouth daily.   Yes [provider]  DULoxetine (CYMBALTA) 30 MG capsule Take 30 mg by mouth daily.   Yes [provider]  levothyroxine (SYNTHROID) 50 MCG tablet Take 50 mcg by mouth daily. 06/12/20  Yes [provider]  Multiple Vitamins-Minerals (PRESERVISION/LUTEIN) CAPS Take 1 capsule by mouth 2 (two) times daily.   Yes [provider]  timolol (TIMOPTIC) 0.5 % ophthalmic solution Place 1 drop into both eyes daily. 12/07/19  Yes [provider]  gabapentin (NEURONTIN) 400 MG capsule Take 400 mg by mouth at bedtime.    [provider]  latanoprost (XALATAN) 0.005 % ophthalmic solution Place 1 drop into both eyes at bedtime.    [provider]  polyethylene glycol (MIRALAX / GLYCOLAX) 17 g packet Take 17 g by mouth daily as needed for mild constipation. 08/09/20   Jennye Boroughs, MD  amLODipine (NORVASC) 2.5 MG tablet Take 1 tablet (2.5 mg total) by mouth daily. 08/09/20 08/10/20  Jennye Boroughs, MD  enoxaparin (LOVENOX) 40 MG/0.4ML injection Inject 0.4 mLs (40 mg total) into the skin daily for 28 days. 05/24/18 05/13/19  Demetrios Loll, MD  losartan (COZAAR) 25 MG tablet Take 1 tablet (25 mg total) by mouth daily. 05/23/18 05/13/19  Demetrios Loll, MD  metoprolol succinate (TOPROL-XL) 25 MG 24 hr tablet Take 1 tablet by mouth daily. 03/21/19 01/28/20  [provider]     Critical care time: 42 minutes    Donell Beers, [place] Pager [phone] (please enter 7 digits) PCCM Consult Pager [phone] (please enter 7 digits)

## 2021-10-07 NOTE — Progress Notes (Signed)
Initial Nutrition Assessment  DOCUMENTATION CODES:   Non-severe (moderate) malnutrition in context of social or environmental circumstances  INTERVENTION:   If tube feeds initiated, recommend:  Vital 1.2'@50ml'$ /hr- Initiate at 27m/hr and increase by 139mhr q 8 hours until goal rate is reached.   Free water flushes 3054m4 hours to maintain tube patency   Regimen provides 1440kcal/day, 90g/day protein and 1153m72my of free water   Pt at high refeed risk; recommend monitor potassium, magnesium and phosphorus labs daily until stable  NUTRITION DIAGNOSIS:   Moderate Malnutrition related to social / environmental circumstances (advanced age) as evidenced by moderate fat depletion, moderate muscle depletion.  GOAL:   Provide needs based on ASPEN/SCCM guidelines  MONITOR:   Vent status, Labs, Weight trends, Skin, I & O's  REASON FOR ASSESSMENT:   Ventilator    ASSESSMENT:   89 y25 female with h/o DDD, depression, anxiety, CKD III and HTN who is admitted with aspiration PNA, UTI and sepsis.  Pt sedated and ventilated. OGT placed and was noted to be coiled in a large hiatal hernia so it was removed. Pt currently does not have enteral access. Plan is to re-assess enteral access tomorrow. If plan is for patient to remain ventilated, will consult fluoroscopy to see if NGT can be advanced past the hiatal hernia. Family at bedside reports pt with fairly good appetite and oral intake at baseline. Pt loves beef, chicken and fish but is not big on vegetables. Pt does have Boost at home that she drinks sometimes but not regularly. Family endorses that pt has had a significant amount of weight over the past several years but reports that she has been weight stable for some time now. Per chart, pt is down 26lbs over the past couple of years but does appear weight stable for the past year.   Medications reviewed and include: colace, lovenox, insulin, synthroid, ocuvite, protonix, miralax,  unasyn  Labs reviewed: K 4.8 wnl, BUN 64(H), creat 2.20(H) Wbc- 16.8(H) Cbgs- 97, 107, 105 x 24 hrs  Patient is currently intubated on ventilator support MV: 8.6 L/min Temp (24hrs), Avg:97.9 F (36.6 C), Min:97.6 F (36.4 C), Max:98.2 F (36.8 C)  Propofol: none   MAP- >65mm67m UOP- 220ml 9mTRITION - FOCUSED PHYSICAL EXAM:  Flowsheet Row Most Recent Value  Orbital Region Moderate depletion  Upper Arm Region No depletion  Thoracic and Lumbar Region No depletion  Buccal Region Moderate depletion  Temple Region Moderate depletion  Clavicle Bone Region Mild depletion  Clavicle and Acromion Bone Region Mild depletion  Scapular Bone Region No depletion  Dorsal Hand Mild depletion  Patellar Region Mild depletion  Anterior Thigh Region Mild depletion  Posterior Calf Region Mild depletion  Edema (RD Assessment) None  Hair Reviewed  Eyes Reviewed  Mouth Reviewed  Skin Reviewed  Nails Reviewed   Diet Order:   Diet Order             Diet NPO time specified  Diet effective now                  EDUCATION NEEDS:   No education needs have been identified at this time  Skin:  Skin Assessment: Reviewed RN Assessment  Last BM:  8/20  Height:   Ht Readings from Last 1 Encounters:  10/01/2021 '5\' 4"'$  (1.626 m)    Weight:   Wt Readings from Last 1 Encounters:  10/07/21 68.6 kg    Ideal Body Weight:  54.5 kg  BMI:  Body mass index is 25.96 kg/m.  Estimated Nutritional Needs:   Kcal:  1259kcal/day  Protein:  80-90g/day  Fluid:  1.4-1.6L/day  Koleen Distance MS, RD, LDN Please refer to [facility] for RD and/or RD on-call/weekend/after hours pager

## 2021-10-07 NOTE — Progress Notes (Signed)
Updated pts grandson Roderic Scarce via telephone regarding pts condition and current plan of care all questions were answered.  Donell Beers, [place] Pager [phone] (please enter 7 digits) PCCM Consult Pager [phone] (please enter 7 digits)

## 2021-10-08 ENCOUNTER — Inpatient Hospital Stay: Payer: Medicare PPO

## 2021-10-08 DIAGNOSIS — J9601 Acute respiratory failure with hypoxia: Secondary | ICD-10-CM | POA: Diagnosis not present

## 2021-10-08 LAB — CBC
HCT: 35.1 % — ABNORMAL LOW (ref 36.0–46.0)
Hemoglobin: 11.2 g/dL — ABNORMAL LOW (ref 12.0–15.0)
MCH: 29.8 pg (ref 26.0–34.0)
MCHC: 31.9 g/dL (ref 30.0–36.0)
MCV: 93.4 fL (ref 80.0–100.0)
Platelets: 225 10*3/uL (ref 150–400)
RBC: 3.76 MIL/uL — ABNORMAL LOW (ref 3.87–5.11)
RDW: 14.5 % (ref 11.5–15.5)
WBC: 16.6 10*3/uL — ABNORMAL HIGH (ref 4.0–10.5)
nRBC: 0 % (ref 0.0–0.2)

## 2021-10-08 LAB — GLUCOSE, CAPILLARY
Glucose-Capillary: 91 mg/dL (ref 70–99)
Glucose-Capillary: 90 mg/dL (ref 70–99)
Glucose-Capillary: 87 mg/dL (ref 70–99)
Glucose-Capillary: 105 mg/dL — ABNORMAL HIGH (ref 70–99)
Glucose-Capillary: 101 mg/dL — ABNORMAL HIGH (ref 70–99)
Glucose-Capillary: 108 mg/dL — ABNORMAL HIGH (ref 70–99)

## 2021-10-08 LAB — LEGIONELLA PNEUMOPHILA SEROGP 1 UR AG: L. pneumophila Serogp 1 Ur Ag: NEGATIVE

## 2021-10-08 LAB — PROCALCITONIN: Procalcitonin: 40.31 ng/mL

## 2021-10-08 LAB — BASIC METABOLIC PANEL
Anion gap: 6 (ref 5–15)
BUN: 60 mg/dL — ABNORMAL HIGH (ref 8–23)
CO2: 24 mmol/L (ref 22–32)
Calcium: 7.7 mg/dL — ABNORMAL LOW (ref 8.9–10.3)
Chloride: 113 mmol/L — ABNORMAL HIGH (ref 98–111)
Creatinine, Ser: 1.58 mg/dL — ABNORMAL HIGH (ref 0.44–1.00)
GFR, Estimated: 31 mL/min — ABNORMAL LOW (ref >60)
Glucose, Bld: 97 mg/dL (ref 70–99)
Potassium: 4.2 mmol/L (ref 3.5–5.1)
Sodium: 143 mmol/L (ref 135–145)

## 2021-10-08 LAB — MAGNESIUM: Magnesium: 2.1 mg/dL (ref 1.7–2.4)

## 2021-10-08 LAB — PHOSPHORUS: Phosphorus: 4 mg/dL (ref 2.5–4.6)

## 2021-10-08 MED ORDER — SODIUM CHLORIDE 0.9 % IV SOLN
2.0000 g | INTRAVENOUS | Status: DC
  Administered 2021-10-08: 2 g via INTRAVENOUS
  Filled 2021-10-08 (×2): qty 12.5

## 2021-10-08 MED ORDER — LEVALBUTEROL HCL 1.25 MG/0.5ML IN NEBU
1.2500 mg | INHALATION_SOLUTION | Freq: Four times a day (QID) | RESPIRATORY_TRACT | Status: DC | PRN
Start: 2021-10-08 — End: 2021-10-11

## 2021-10-08 MED ORDER — HYDRALAZINE HCL 20 MG/ML IJ SOLN
10.0000 mg | INTRAMUSCULAR | Status: DC | PRN
  Administered 2021-10-08: 20 mg via INTRAVENOUS
  Administered 2021-10-09: 10 mg via INTRAVENOUS
  Administered 2021-10-09: 20 mg via INTRAVENOUS
  Administered 2021-10-09: 10 mg via INTRAVENOUS
  Filled 2021-10-08 (×4): qty 1

## 2021-10-08 NOTE — Progress Notes (Signed)
[place] for Electrolyte Monitoring and Replacement   Recent Labs: Potassium (mmol/L)  Date Value  10/08/2021 4.2   Magnesium (mg/dL)  Date Value  10/08/2021 2.1   Calcium (mg/dL)  Date Value  10/08/2021 7.7 (L)   Albumin (g/dL)  Date Value  10/06/2021 3.0 (L)  12/08/2016 4.5   Phosphorus (mg/dL)  Date Value  10/08/2021 4.0   Sodium (mmol/L)  Date Value  10/08/2021 143  12/08/2016 140   Corrected Ca 8.5 mg/dL  Assessment: 88 y/o female with h/o DDD, depression, anxiety, CKD III and HTN who is admitted with aspiration PNA, UTI and sepsis. Pharmacy is asked to follow and replace electrolytes while in CCU  Goal of Therapy:  Electrolytes WNL  Plan:  No electrolyte replacement warranted for today Recheck electrolytes in am  Dallie Piles ,PharmD Clinical Pharmacist 10/08/2021 7:07 AM

## 2021-10-08 NOTE — Progress Notes (Signed)
NAME:  Alexandria Roman, MRN:  [id number], DOB:  1930/02/06, LOS: 3 ADMISSION DATE:  09/15/2021, CONSULTATION DATE:  10/06/21 REFERRING MD:  Dr. Posey Pronto, CHIEF COMPLAINT:  Acute Respiratory Distress   Brief Pt Description / Synopsis:  88 y.o. female admitted with Severe Sepsis and Acute Hypoxic Respiratory Failure in the setting of Aspiration Pneumonia, failed trial of BiPAP requiring intubation and mechanical ventilation.  History of Present Illness:  Alexandria Roman is a 88 year old female with a past medical history significant for COPD, hypertension, CKD stage III, chronic pain, hypothyroidism, hyperlipidemia, depression, anxiety who presented to [facility] ED on 10/05/2020 due to complaints of progressive shortness of breath.  Patient is currently unable to contribute to history due to acute respiratory distress and BiPAP, therefore history is obtained from chart review.  She reported she had an episode of vomiting after eating seafood on Sunday, which she then developed progressive shortness of breath.  She lives in the independent living facility and has nursing assistance for ADLs.  When her nurse checked on her she was noted to be tachypneic, of which EMS was dispatched.  Upon EMS arrival she was noted to be hypoxic with O2 sats in the 70s which she was placed on nasal cannula.  Upon arrival to the ED she was placed on BiPAP due to acute respiratory distress.  ED Course: Initial Vital Signs: Respiratory rate 20s, pulse 137, blood pressure 78/62, SPO2 100% on BiPAP Significant Labs: Glucose 247, BUN 39, creatinine 1.61, high-sensitivity troponin 13, lactic acid 4.9, WBC 8.9, hemoglobin 18.8, hematocrit 60.5 VBG on nonrebreather: pH 7.26/PCO2 58/PO2 less than 31/bicarb 26 COVID-19 PCR is negative Urinalysis is consistent with UTI (moderate leukocytes, WBC greater than 50) Imaging Chest X-ray>>IMPRESSION: 1. Bilateral lower lung field opacities may represent developing infiltrate or aspiration. 2.  Large hiatal hernia. Medications Administered: Azithromycin and ceftriaxone, Unasyn  Hospitalist were asked to admit for further work-up and treatment.  Please see "significant hospital events" section below for full detailed hospital course.  Pertinent  Medical History   Past Medical History:  Diagnosis Date   Cancer ([place])    Hypertension     Micro Data:  8/21: COVID-19 PCR>>negative 8/21: Blood culture x2>>negative  8/21: Urine>>negative  8/21: Strep pneumo urinary antigen>>negative  8/23: Legionella urinary antigen>>negative   Antimicrobials:  Azithromycin 8/21 x1 dose Ceftriaxone 8/21 x1 dose Unasyn 8/21>>  Significant Hospital Events: Including procedures, antibiotic start and stop dates in addition to other pertinent events   8/22: Admitted by Hospitalist.  Requiring BiPAP 8/22: Persistent respiratory distress despite BiPAP.  PCCM consulted, emergently intubated.  Discussion with son, code status changed to DNR 8/23: Pt remains mechanically intubated vent settings weaned to 50%/PEEP 10, sedated with fentanyl gtt and following some commands 8/24: Pt remains mechanically intubated currently tolerating SBT 5/5. Prior to proceeding with extubation discussing with pts primary POA Roderic Scarce via telephone if pt is extubated and fails extubation would he want the pt reintubated   Interim History / Subjective:  As outlined above   Objective   Blood pressure 130/79, pulse 93, temperature 98.1 F (36.7 C), temperature source Oral, resp. rate 19, height $RemoveBe'5\' 4"'DqesZBSiD$  (1.626 m), weight 68.6 kg, SpO2 96 %. CVP:  [2 mmHg-8 mmHg] 5 mmHg  Vent Mode: Spontaneous FiO2 (%):  [40 %-60 %] 40 % Set Rate:  [18 bmp] 18 bmp Vt Set:  [440 mL] 440 mL PEEP:  [5 cmH20-10 cmH20] 5 cmH20 Pressure Support:  [5 cmH20] 5 cmH20 Plateau Pressure:  [15 VVO16-07  cmH20] 15 cmH20   Intake/Output Summary (Last 24 hours) at 10/08/2021 0757 Last data filed at 10/08/2021 0700 Gross per 24 hour  Intake 423.57 ml   Output 1250 ml  Net -826.43 ml   Filed Weights   10/06/2021 2119 10/07/21 0438  Weight: 69 kg 68.6 kg    Examination: General: Acute on chronically ill-appearing female, sedated NAD mechanically intubated  HENT: Supple, mild JVD present  Lungs: Diminished with faint rhonchi throughout, even, non labored  Cardiovascular: Sinus tachycardia, no r/g, 2+ radial/2+ distal pulses, no edema  Abdomen: +BS x4, soft, non distended, tenderness present  Extremities: No deformities, trace edema bilaterally Neuro: Lightly sedated, intermittently following commands, PERRL GU: Indwelling catheter draining yellow urine  Resolved Hospital Problem list   Hypotension   Assessment & Plan:  Acute Hypoxic Respiratory Failure due to Aspiration - Full vent support, implement lung protective strategies - Plateau pressures less than 30 cm H20 - Wean FiO2 & PEEP as tolerated to maintain O2 sats >92% - Follow intermittent Chest X-ray & ABG as needed - Spontaneous Breathing Trials when respiratory parameters met and mental status permits - Implement VAP Bundle - IV and nebulized steroids   Severe Sepsis due to Aspiration Pneumonia & UTI (Met SIRS Criteria on admission: HR >90, RR >20, lactic 4.9)  - Trend WBC and monitor fever curve  - Trend PCT  - Follow cultures as above - Continue empiric Unasyn pending cultures & sensitivities  Tachycardia due to Sepsis PMHx: Hypertension, Hyperlipidemia - Continuous telemetry monitoring  - Maintain map >65   AKI on CKD Stage IIIa Lactic acidosis  - Trend BMP  - Replace electrolytes as indicated  - Monitor UOP - Avoid nephrotoxic medications   Abdominal pain  -KUB pending   Hypothyroidism  - Continue outpatient synthroid - Thyroid panel with TSH pending   Hyperglycemia  Hemoglobin A1C: 5.6 - CBG's q4h; Target range of 140 to 180 - SSI - Follow ICU Hypo/Hyperglycemia protocol  Acute Metabolic Encephalopathy Sedation needs in the setting of  mechanical ventilation PMHx: anxiety, depression - Maintain a RASS goal of 0 to -1 - Fentanyl as needed to maintain RASS goal - Daily wake up assessment  Best Practice (right click and "Reselect all SmartList Selections" daily)   Diet/type: NPO; if unable to extubate in the next 24hrs will start TF's  DVT prophylaxis: LMWH GI prophylaxis: PPI Lines: Central line Foley:  Yes, and it is still needed Code Status:  DNR Last date of multidisciplinary goals of care discussion [10/08/21]  Updated pts primary POA grandson Roderic Scarce via telephone regarding plan of care and all questions answered  Labs   CBC: Recent Labs  Lab 09/22/2021 2154 10/06/21 0536 10/07/21 0452 10/08/21 0350  WBC 8.9 5.4 16.8* 16.6*  HGB 18.8* 16.6* 12.9 11.2*  HCT 60.5* 50.8* 41.1 35.1*  MCV 93.4 91.4 93.6 93.4  PLT 369 284 233 353    Basic Metabolic Panel: Recent Labs  Lab 09/19/2021 2154 10/06/21 0536 10/07/21 0452 10/08/21 0350  NA 135 137 140 143  K 4.9 4.5 4.8 4.2  CL 98 106 108 113*  CO2 22 20* 23 24  GLUCOSE 247* 137* 113* 97  BUN 39* 47* 64* 60*  CREATININE 1.61* 1.68* 2.20* 1.58*  CALCIUM 9.4 8.3* 7.7* 7.7*  MG  --   --   --  2.1  PHOS  --   --   --  4.0   GFR: Estimated Creatinine Clearance: 23 mL/min (A) (by C-G formula based  on SCr of 1.58 mg/dL (H)). Recent Labs  Lab 10/14/2021 2154 09/21/2021 2337 10/06/21 0536 10/06/21 1325 10/06/21 1736 10/06/21 2002 10/07/21 0452 10/08/21 0350  PROCALCITON  --   --   --  60.58  --   --  71.05 40.31  WBC 8.9  --  5.4  --   --   --  16.8* 16.6*  LATICACIDVEN 4.9* 4.1*  --  2.3* 2.8* 2.5*  --   --     Liver Function Tests: Recent Labs  Lab 10/07/2021 2154 10/06/21 0536  AST 27 26  ALT 11 10  ALKPHOS 61 37*  BILITOT 1.1 1.1  PROT 7.5 6.0*  ALBUMIN 3.8 3.0*   Recent Labs  Lab 10/08/2021 2154  LIPASE 27   No results for input(s): "AMMONIA" in the last 168 hours.  ABG    Component Value Date/Time   PHART 7.31 (L) 10/07/2021 1051    PCO2ART 38 10/07/2021 1051   PO2ART 79 (L) 10/07/2021 1051   HCO3 19.1 (L) 10/07/2021 1051   ACIDBASEDEF 6.6 (H) 10/07/2021 1051   O2SAT 97.6 10/07/2021 1051     Coagulation Profile: No results for input(s): "INR", "PROTIME" in the last 168 hours.  Cardiac Enzymes: No results for input(s): "CKTOTAL", "CKMB", "CKMBINDEX", "TROPONINI" in the last 168 hours.  HbA1C: Hgb A1c MFr Bld  Date/Time Value Ref Range Status  10/07/2021 04:52 AM 5.6 4.8 - 5.6 % Final    Comment:    (NOTE) Pre diabetes:          5.7%-6.4%  Diabetes:              >6.4%  Glycemic control for   <7.0% adults with diabetes     CBG: Recent Labs  Lab 10/07/21 1600 10/07/21 2013 10/07/21 2342 10/08/21 0350 10/08/21 0749  GLUCAP 109* 107* 97 91 90    Review of Systems:   Unable to assess due to respiratory distress and BiPAP   Past Medical History:  She,  has a past medical history of Cancer ([place]) and Hypertension.   Surgical History:   Past Surgical History:  Procedure Laterality Date   ABDOMINAL HYSTERECTOMY     HIP ARTHROPLASTY Right 05/20/2018   Procedure: ARTHROPLASTY BIPOLAR HIP (HEMIARTHROPLASTY);  Surgeon: Thornton Park, MD;  Location: [facility] ORS;  Service: Orthopedics;  Laterality: Right;     Social History:   reports that she has never smoked. She has never used smokeless tobacco. She reports that she does not drink alcohol and does not use drugs.   Family History:  Her family history includes Testicular cancer in her father.   Allergies Allergies  Allergen Reactions   Amoxicillin-Pot Clavulanate Diarrhea   Cephalexin Other (See Comments) and Nausea And Vomiting   Hydrochlorothiazide Other (See Comments) and Nausea And Vomiting   Hydrocodone Bit-Homatrop Mbr Other (See Comments)   Hydrocodone-Chlorpheniramine Other (See Comments)   Metronidazole Other (See Comments)    Other reaction(s): Other (See Comments) Other Reaction: Not Assessed Other Reaction: Not Assessed    Moxifloxacin Other (See Comments)   Prednisone Nausea Only   Simvastatin Other (See Comments)   Sulfa Antibiotics Other (See Comments)    Other reaction(s): Unknown   Sulfasalazine Other (See Comments)    Other reaction(s): Unknown   Tramadol Other (See Comments)   Celecoxib Nausea And Vomiting   Codeine Nausea Only    Other reaction(s): Other (See Comments) Other reaction(s): Unknown   Cyclobenzaprine Other (See Comments) and Nausea Only    Other reaction(s): Other (  See Comments)   Oxycodone-Acetaminophen Nausea Only and Nausea And Vomiting   Penicillin V Potassium Rash    Other reaction(s): Other (See Comments) Other reaction(s): Unknown     Home Medications  Prior to Admission medications   Medication Sig Start Date End Date Taking? Authorizing Provider  calcitonin, salmon, (MIACALCIN/FORTICAL) 200 UNIT/ACT nasal spray Place 1 spray into alternate nostrils daily. 04/23/20  Yes Sharen Hones, MD  Cranberry 450 MG CAPS Take 450 mg by mouth daily.   Yes [provider]  DULoxetine (CYMBALTA) 30 MG capsule Take 30 mg by mouth daily.   Yes [provider]  levothyroxine (SYNTHROID) 50 MCG tablet Take 50 mcg by mouth daily. 06/12/20  Yes [provider]  Multiple Vitamins-Minerals (PRESERVISION/LUTEIN) CAPS Take 1 capsule by mouth 2 (two) times daily.   Yes [provider]  timolol (TIMOPTIC) 0.5 % ophthalmic solution Place 1 drop into both eyes daily. 12/07/19  Yes [provider]  gabapentin (NEURONTIN) 400 MG capsule Take 400 mg by mouth at bedtime.    [provider]  latanoprost (XALATAN) 0.005 % ophthalmic solution Place 1 drop into both eyes at bedtime.    [provider]  polyethylene glycol (MIRALAX / GLYCOLAX) 17 g packet Take 17 g by mouth daily as needed for mild constipation. 08/09/20   Jennye Boroughs, MD  amLODipine (NORVASC) 2.5 MG tablet Take 1 tablet (2.5 mg total) by mouth daily. 08/09/20 08/10/20  Jennye Boroughs,  MD  enoxaparin (LOVENOX) 40 MG/0.4ML injection Inject 0.4 mLs (40 mg total) into the skin daily for 28 days. 05/24/18 05/13/19  Demetrios Loll, MD  losartan (COZAAR) 25 MG tablet Take 1 tablet (25 mg total) by mouth daily. 05/23/18 05/13/19  Demetrios Loll, MD  metoprolol succinate (TOPROL-XL) 25 MG 24 hr tablet Take 1 tablet by mouth daily. 03/21/19 01/28/20  [provider]     Critical care time: 35 minutes    Donell Beers, [place] Pager [phone] (please enter 7 digits) PCCM Consult Pager [phone] (please enter 7 digits)

## 2021-10-08 NOTE — IPAL (Signed)
  Interdisciplinary Goals of Care Family Meeting   Date carried out: 10/08/2021  Location of the meeting: Bedside  Member's involved: Physician and Family Member or next of kin    GOALS OF CARE DISCUSSION  The Clinical status was relayed to family in detail- Tillie Rung over the phone, [facility] health Nurse    Updated and notified of patients medical condition- Patient remains unresponsive and will not open eyes to command.   Plan for weaning trials and possible trial of extubation Explained to family course of therapy and the modalities   Patient with Progressive multiorgan failure with a very high probablity of a very minimal chance of meaningful recovery despite all aggressive and optimal medical therapy.  PATIENT REMAINS DNR status  Family understands the situation.  Plan for SAT/SBT Possible trial of extubation if able Plan for private home health aganecy to take patient home with hospice  Family are satisfied with Plan of action and management. All questions answered  Additional CC time 45 mins   Adair Lauderback Patricia Pesa, M.D.  [facility]  Medical Director [place] Director [facility] Cardio-Pulmonary Department

## 2021-10-08 NOTE — Progress Notes (Signed)
Pt extubated and placed on 5lpm [place], sats 93%, no stridor noted, respiratory rate 16/min.

## 2021-10-09 DIAGNOSIS — E440 Moderate protein-calorie malnutrition: Secondary | ICD-10-CM

## 2021-10-09 DIAGNOSIS — E039 Hypothyroidism, unspecified: Secondary | ICD-10-CM | POA: Diagnosis not present

## 2021-10-09 DIAGNOSIS — T17908A Unspecified foreign body in respiratory tract, part unspecified causing other injury, initial encounter: Secondary | ICD-10-CM | POA: Diagnosis not present

## 2021-10-09 DIAGNOSIS — J9601 Acute respiratory failure with hypoxia: Secondary | ICD-10-CM | POA: Diagnosis not present

## 2021-10-09 LAB — BASIC METABOLIC PANEL
Anion gap: 8 (ref 5–15)
BUN: 47 mg/dL — ABNORMAL HIGH (ref 8–23)
CO2: 24 mmol/L (ref 22–32)
Calcium: 8.8 mg/dL — ABNORMAL LOW (ref 8.9–10.3)
Chloride: 114 mmol/L — ABNORMAL HIGH (ref 98–111)
Creatinine, Ser: 1.15 mg/dL — ABNORMAL HIGH (ref 0.44–1.00)
GFR, Estimated: 46 mL/min — ABNORMAL LOW (ref >60)
Glucose, Bld: 108 mg/dL — ABNORMAL HIGH (ref 70–99)
Potassium: 4 mmol/L (ref 3.5–5.1)
Sodium: 146 mmol/L — ABNORMAL HIGH (ref 135–145)

## 2021-10-09 LAB — GLUCOSE, CAPILLARY
Glucose-Capillary: 104 mg/dL — ABNORMAL HIGH (ref 70–99)
Glucose-Capillary: 127 mg/dL — ABNORMAL HIGH (ref 70–99)
Glucose-Capillary: 149 mg/dL — ABNORMAL HIGH (ref 70–99)
Glucose-Capillary: 224 mg/dL — ABNORMAL HIGH (ref 70–99)
Glucose-Capillary: 229 mg/dL — ABNORMAL HIGH (ref 70–99)
Glucose-Capillary: 97 mg/dL (ref 70–99)

## 2021-10-09 LAB — CBC
HCT: 37.3 % (ref 36.0–46.0)
Hemoglobin: 11.9 g/dL — ABNORMAL LOW (ref 12.0–15.0)
MCH: 29.5 pg (ref 26.0–34.0)
MCHC: 31.9 g/dL (ref 30.0–36.0)
MCV: 92.3 fL (ref 80.0–100.0)
Platelets: 228 10*3/uL (ref 150–400)
RBC: 4.04 MIL/uL (ref 3.87–5.11)
RDW: 14.5 % (ref 11.5–15.5)
WBC: 17.7 10*3/uL — ABNORMAL HIGH (ref 4.0–10.5)
nRBC: 0.2 % (ref 0.0–0.2)

## 2021-10-09 LAB — MAGNESIUM: Magnesium: 2.4 mg/dL (ref 1.7–2.4)

## 2021-10-09 LAB — THYROID PANEL WITH TSH
Free Thyroxine Index: 1.5 (ref 1.2–4.9)
T3 Uptake Ratio: 31 % (ref 24–39)
T4, Total: 4.7 ug/dL (ref 4.5–12.0)
TSH: 3.76 u[IU]/mL (ref 0.450–4.500)

## 2021-10-09 LAB — PHOSPHORUS: Phosphorus: 2.7 mg/dL (ref 2.5–4.6)

## 2021-10-09 MED ORDER — DULOXETINE HCL 30 MG PO CPEP
30.0000 mg | ORAL_CAPSULE | Freq: Every day | ORAL | Status: DC
  Administered 2021-10-09: 30 mg via ORAL
  Filled 2021-10-09 (×2): qty 1

## 2021-10-09 MED ORDER — GABAPENTIN 400 MG PO CAPS
400.0000 mg | ORAL_CAPSULE | Freq: Every day | ORAL | Status: DC
  Administered 2021-10-09 – 2021-10-10 (×2): 400 mg via ORAL
  Filled 2021-10-09 (×3): qty 1

## 2021-10-09 MED ORDER — POLYETHYLENE GLYCOL 3350 17 G PO PACK: 17.0000 g | PACK | Freq: Every day | ORAL | Status: DC | PRN

## 2021-10-09 MED ORDER — ENSURE ENLIVE PO LIQD: 237.0000 mL | Freq: Two times a day (BID) | ORAL | Status: DC

## 2021-10-09 MED ORDER — IPRATROPIUM BROMIDE 0.02 % IN SOLN: 0.5000 mg | Freq: Four times a day (QID) | RESPIRATORY_TRACT | Status: DC | PRN

## 2021-10-09 MED ORDER — CEFEPIME HCL 2 G IV SOLR
2.0000 g | Freq: Two times a day (BID) | INTRAVENOUS | Status: DC
Start: 2021-10-09 — End: 2021-10-10
  Administered 2021-10-09 – 2021-10-10 (×3): 2 g via INTRAVENOUS
  Filled 2021-10-09 (×3): qty 12.5

## 2021-10-09 MED ORDER — ADULT MULTIVITAMIN W/MINERALS CH
1.0000 | ORAL_TABLET | Freq: Every day | ORAL | Status: DC
Start: 2021-10-10 — End: 2021-10-11
  Filled 2021-10-09: qty 1

## 2021-10-09 MED ORDER — ENOXAPARIN SODIUM 40 MG/0.4ML IJ SOSY
40.0000 mg | PREFILLED_SYRINGE | INTRAMUSCULAR | Status: DC
  Administered 2021-10-09 – 2021-10-11 (×3): 40 mg via SUBCUTANEOUS
  Filled 2021-10-09 (×3): qty 0.4

## 2021-10-09 MED ORDER — OCUVITE-LUTEIN PO CAPS
1.0000 | ORAL_CAPSULE | Freq: Two times a day (BID) | ORAL | Status: DC
Start: 2021-10-09 — End: 2021-10-11
  Administered 2021-10-09 – 2021-10-10 (×3): 1 via ORAL
  Filled 2021-10-09 (×5): qty 1

## 2021-10-09 MED ORDER — LEVOTHYROXINE SODIUM 50 MCG PO TABS
50.0000 ug | ORAL_TABLET | Freq: Every day | ORAL | Status: DC
  Administered 2021-10-09 – 2021-10-10 (×2): 50 ug via ORAL
  Filled 2021-10-09 (×3): qty 1

## 2021-10-09 MED ORDER — CALCITONIN (SALMON) 200 UNIT/ACT NA SOLN
1.0000 | Freq: Every day | NASAL | Status: DC
  Administered 2021-10-11: 1 via NASAL
  Filled 2021-10-09 (×2): qty 3.7

## 2021-10-09 MED ORDER — PHENOL 1.4 % MT LIQD: 1.0000 | OROMUCOSAL | Status: DC | PRN

## 2021-10-09 NOTE — TOC Initial Note (Signed)
Transition of Care [facility]) - Initial/Assessment Note    Patient Details  Name: Alexandria Roman MRN: [id number] Date of Birth: 12-24-1929  [facility] CM/SW Contact:    Shelbie Hutching, RN Phone Number: 10/09/2021, 4:21 PM  Clinical Narrative:                 Patient admitted to the hospital with acute respiratory failure initially requiring intubation.  Patient is now extubated tolerating [place] 4L.  Patient's home care nurse Deneise Lever from [facility] at the bedside today, she has been taking care of patient for over a year.  Tillie Rung is the [place], he is here for the weekend.  They have decided to take the patient home with [facility].  RNCM reached out to [employer] with [facility] and placed a referral for home hospice services.  Patient will need oxygen and bedside commode.  Deneise Lever thinks that the patient will likely need EMS transport.  Melissa reports that admission to hospice services could be Monday.    Expected Discharge Plan: Home w Hospice Care Barriers to Discharge: Continued Medical Work up   Patient Goals and CMS Choice Patient states their goals for this hospitalization and ongoing recovery are:: Family has decided to go home hospice CMS Medicare.gov Compare Post Acute Care list provided to:: Patient Represenative (must comment) Choice offered to / list presented to : [facility] POA / Guardian  Expected Discharge Plan and Services Expected Discharge Plan: Home w Hospice Care   Discharge Planning Services: CM Consult Post Acute Care Choice: Hospice Living arrangements for the past 2 months: [place]                 DME Arranged: Oxygen, 3-N-1 DME Agency: [facility] Date DME Agency Contacted: 10/09/21 Time DME Agency Contacted: 7 Representative spoke with at DME Agency: [place]            Prior Living Arrangements/Services Living arrangements for the past 2 months: [place] Lives with:: Self Patient language and need for interpreter reviewed:: Yes Do you feel safe going back to the place where you live?: Yes      Need for Family Participation in Patient Care: Yes (Comment) Care giver support system in place?: Yes (comment) Current home services: DME, Homehealth aide Criminal Activity/Legal Involvement Pertinent to Current Situation/Hospitalization: No - Comment as needed  Activities of Daily Living      Permission Sought/Granted Permission sought to share information with : Case Manager, Family Supports, Other (comment) Permission granted to share information with : Yes, Verbal Permission Granted  Share Information with NAME: Roderic Scarce  Permission granted to share info w AGENCY: [facility] and [place] granted to share info w Relationship: Grandson  Permission granted to share info w Contact Information: [phone]  Emotional Assessment Appearance:: Appears stated age     Orientation: : Oriented to Self, Oriented to Situation, Oriented to Place Alcohol / Substance Use: Not Applicable Psych Involvement: No (comment)  Admission diagnosis:  Acute respiratory failure with hypoxia (HCC) [J96.01] Aspiration pneumonia of right lower lobe due to vomit ([place]) [J69.0] Nausea and vomiting, unspecified vomiting type [R11.2] Patient Active Problem List   Diagnosis Date Noted   Malnutrition of moderate degree 10/07/2021   Acute renal failure superimposed on stage 3a chronic kidney disease ([place]) 10/13/2021   Aspiration into respiratory tract 10/13/2021   UTI (urinary tract infection) 08/05/2020  CKD (chronic kidney disease), stage IIIa 08/05/2020   Left rib fracture 18/56/3149   Acute metabolic encephalopathy 70/26/3785   Fall 08/05/2020   Acute respiratory failure with hypoxia ([place]) 08/05/2020   Abnormal EKG 08/05/2020   CAP (community acquired pneumonia) 08/05/2020   Severe sepsis ([place])    Cystitis    E. coli  UTI    Chronic thoracic back pain    Sepsis due to gram-negative UTI ([place]) 06/22/2020   Nocturnal hypoxia    Weakness    Neuropathy    Glaucoma of both eyes    Thoracic compression fracture ([place]) 04/19/2020   Hip fracture ([place]) 05/19/2018   Depression with anxiety 12/09/2016   Numbness of foot 12/09/2016   Macular degeneration 12/09/2016   Gait instability 12/08/2016   Upper respiratory infection, viral 12/06/2016   Primary osteoarthritis of both knees 07/20/2016   Postmenopausal osteoporosis 04/15/2016   Chest pain 03/19/2016   Spinal stenosis 08/04/2015   Myalgia 06/11/2015   Polyarthralgia 06/11/2015   Chronic midline low back pain with bilateral sciatica 01/10/2015   Essential hypertension 06/04/2014   Mixed hyperlipidemia 06/04/2014   Other allergic rhinitis 06/04/2014   DDD (degenerative disc disease), lumbar 09/07/2013   Lumbar radiculitis 09/07/2013   Hypothyroidism, unspecified 08/02/2013   PCP:  Adin Hector, MD Pharmacy:   [place], [place] [address] [place] [address] Phone: [phone] Fax: [phone]     Social Determinants of Health (SDOH) Interventions    Readmission Risk Interventions    08/10/2020    2:54 PM  Readmission Risk Prevention Plan  Transportation Screening Complete  HRI or [place] Complete  Social Work Consult for [place] Planning/Counseling Complete  Palliative Care Screening Complete  Medication Review Press photographer) Complete

## 2021-10-09 NOTE — Progress Notes (Addendum)
[facility] [place] NAME: Alexandria Roman    MR#:  [id number]  DATE OF BIRTH:  1929/10/21  SUBJECTIVE:   Patient awake and alert. Patient's home care nurses at bedside. Patient complains of sore throat and inability to speak. Explain to her that is due to her intubation tube.   VITALS:  Blood pressure (!) 141/95, pulse 70, temperature 97.7 F (36.5 C), temperature source Oral, resp. rate 18, height '5\' 4"'$  (1.626 m), weight 68.4 kg, SpO2 93 %.  PHYSICAL EXAMINATION:   GENERAL:  88 y.o.-year-old patient lying in the bed with no acute distress.  LUNGS: decreased breath sounds bilaterally, no wheezing, rales, rhonchi.  CARDIOVASCULAR: S1, S2 normal. No murmurs, rubs, or gallops.  ABDOMEN: Soft, nontender, nondistended.  EXTREMITIES: No  edema b/l.    NEUROLOGIC: nonfocal  patient is alert and awake, weak SKIN: per RN  LABORATORY PANEL:  CBC Recent Labs  Lab 10/09/21 0541  WBC 17.7*  HGB 11.9*  HCT 37.3  PLT 228    Chemistries  Recent Labs  Lab 10/06/21 0536 10/07/21 0452 10/09/21 0541  NA 137   < > 146*  K 4.5   < > 4.0  CL 106   < > 114*  CO2 20*   < > 24  GLUCOSE 137*   < > 108*  BUN 47*   < > 47*  CREATININE 1.68*   < > 1.15*  CALCIUM 8.3*   < > 8.8*  MG  --    < > 2.4  AST 26  --   --   ALT 10  --   --   ALKPHOS 37*  --   --   BILITOT 1.1  --   --    < > = values in this interval not displayed.   Cardiac Enzymes No results for input(s): "TROPONINI" in the last 168 hours. RADIOLOGY:  DG Abd 1 View  Result Date: 10/08/2021 CLINICAL DATA:  Abdominal pain EXAM: ABDOMEN - 1 VIEW COMPARISON:  None Available. FINDINGS: Bowel gas pattern is nonspecific. No abnormal masses or calcifications are seen. Surgical clips are seen in pelvis. There is previous right hip arthroplasty. There is previous vertebroplasty in the L1 vertebra. There is increased density in left lower lung field. Degenerative changes are noted in lumbar  spine, more so at the L4-L5 and L5-S1 levels. IMPRESSION: Nonspecific bowel gas pattern. Electronically Signed   By: Elmer Picker M.D.   On: 10/08/2021 08:16    Assessment and Plan Alexandria Roman is a 88 year old female with a past medical history significant for COPD, hypertension, CKD stage III, chronic pain, hypothyroidism, hyperlipidemia, depression, anxiety who presented to [facility] ED on 10/05/2020 due to complaints of progressive shortness of breath.   She reported she had an episode of vomiting after eating seafood on Sunday, which she then developed progressive shortness of breath.  She lives in the independent living facility and has nursing assistance for ADLs.  When her nurse checked on her she was noted to be tachypneic, of which EMS was dispatched.  Upon EMS arrival she was noted to be hypoxic with O2 sats in the 70s which she was placed on nasal cannula.   Upon arrival to the ED she was placed on BiPAP due to acute respiratory distress. Patient deteriorated and was thereafter intubated and placed on the ventilator.  Chest X-ray>>IMPRESSION: 1. Bilateral lower lung field opacities may represent developing infiltrate or aspiration. 2. Large hiatal hernia  acute hypoxic respiratory failure secondary to aspiration severe sepsis due to aspiration pneumonia and UTI -- patient now extubated. She is DNR DNI -- cont cefepime -- remains afebrile. -- Wean oxygen down to keep sats greater than 92% -- sepsis improved  AK I on CKD stage IIIa -- improving -- received IV fluids --Monitor UOP - Avoid nephrotoxic medications   Chronic dysphagia -- per patient's home care RN anymore son who has discussed with patient's POA Marya Amsler they would like patient to be on her previous diet with thin liquid. They declined speech therapy to see. They understand patient is at a high risk for aspiration. -- Will give food for pleasure  Hypothyroidism -- continue Synthroid  Generalized weakness,  deconditioning -- Dr. Mortimer Fries has discussed with patient's [place] and home healthcare RN Desmond Dike-- plan is to discharge patient to home with hospice. They declined PT OT to see patient.  Nutrition Status: Nutrition Problem: Moderate Malnutrition Etiology: social / environmental circumstances (advanced age) Signs/Symptoms: moderate fat depletion, moderate muscle depletion      Transfer out of ICU.    Procedures: mechanical intubation Family communication : Therapist, nutritional (>1 year) who is in touch with Marya Amsler Education officer, community) Consults :PCCM CODE STATUS: DNR/DNI DVT Prophylaxis : Level of care: Med-Surg Status is: Inpatient Remains inpatient appropriate because: will go home on Monday once home hospice has been set up    [place]: 35 minutes.  >50% time spent on counselling and coordination of care  Note: This dictation was prepared with Dragon dictation along with smaller phrase technology. Any transcriptional errors that result from this process are unintentional.  Fritzi Mandes M.D    [facility]   CC: Primary care physician; Adin Hector, MD

## 2021-10-09 NOTE — Progress Notes (Signed)
Nutrition Follow Up Note   DOCUMENTATION CODES:   Non-severe (moderate) malnutrition in context of social or environmental circumstances  INTERVENTION:   Ensure Enlive po BID, each supplement provides 350 kcal and 20 grams of protein.  Magic cup TID with meals, each supplement provides 290 kcal and 9 grams of protein  MVI po daily   Pt at high refeed risk; recommend monitor potassium, magnesium and phosphorus labs daily until stable  NUTRITION DIAGNOSIS:   Moderate Malnutrition related to social / environmental circumstances (advanced age) as evidenced by moderate fat depletion, moderate muscle depletion.  GOAL:   Patient will meet greater than or equal to 90% of their needs -not met   MONITOR:   PO intake, Supplement acceptance, Labs, Weight trends, Skin, I & O's  ASSESSMENT:   88 y/o female with h/o DDD, depression, anxiety, hiatal hernia, CKD III and HTN who is admitted with aspiration PNA, UTI and sepsis.  Pt extubated yesterday. Pt seen by SLP today and placed on a mechanical soft diet. Pt unable to receive tube feeds while intubated r/t a large hiatal hernia. RD will add supplements and MVI to help pt meet her estimated needs. Pt is at high refeed risk. No BM since 8/20; MD aware. Per chart, pt is down ~2lbs since admission. Pt +57ml on her I & Os.   Medications reviewed and include: lovenox, insulin, solu-medrol, cuvite, protonix, cefepime   Labs reviewed: Na 146(H), K 4.0 wnl, BUN 47(H), creat 1.15(H), P 2.7 wnl, Mg 2.4 wnl Wbc- 17.7(H) Cbgs- 127, 97, 104 x 24 hrs  Diet Order:   Diet Order             DIET DYS 3 Room service appropriate? Yes; Fluid consistency: Thin  Diet effective now                  EDUCATION NEEDS:   No education needs have been identified at this time  Skin:  Skin Assessment: Reviewed RN Assessment  Last BM:  8/20  Height:   Ht Readings from Last 1 Encounters:  09/21/2021 $RemoveB'5\' 4"'iOtbtXDt$  (1.626 m)    Weight:   Wt Readings from  Last 1 Encounters:  10/09/21 68.4 kg    Ideal Body Weight:  54.5 kg  BMI:  Body mass index is 25.88 kg/m.  Estimated Nutritional Needs:   Kcal:  1400-1600kcal/day  Protein:  70-80g/day  Fluid:  1.4-1.6L/day  Koleen Distance MS, RD, LDN Please refer to [facility] for RD and/or RD on-call/weekend/after hours pager

## 2021-10-09 NOTE — Progress Notes (Signed)
PHARMACY NOTE:  ANTIMICROBIAL RENAL DOSAGE ADJUSTMENT  Current antimicrobial regimen includes a mismatch between antimicrobial dosage and estimated renal function.  As per policy approved by the Pharmacy & Therapeutics and Medical Executive Committees, the antimicrobial dosage will be adjusted accordingly.  Current antimicrobial dosage:  cefepime 2gm IV q24h  Indication: E. Aerogenes pneumonia  Renal Function:  Estimated Creatinine Clearance: 31.5 mL/min (A) (by C-G formula based on SCr of 1.15 mg/dL (H)). '[]'$      On intermittent HD, scheduled: '[]'$      On CRRT    Antimicrobial dosage has been changed to:  cefepime 2gm IV q12h for improved CrCl  Additional comments:   Thank you for allowing pharmacy to be a part of this patient's care.  Doreene Eland, PharmD, BCPS, BCIDP Work Cell: [phone] 10/09/2021 12:37 PM

## 2021-10-09 NOTE — Progress Notes (Signed)
[place] for Electrolyte Monitoring and Replacement   Recent Labs: Potassium (mmol/L)  Date Value  10/09/2021 4.0   Magnesium (mg/dL)  Date Value  10/09/2021 2.4   Calcium (mg/dL)  Date Value  10/09/2021 8.8 (L)   Albumin (g/dL)  Date Value  10/06/2021 3.0 (L)  12/08/2016 4.5   Phosphorus (mg/dL)  Date Value  10/09/2021 2.7   Sodium (mmol/L)  Date Value  10/09/2021 146 (H)  12/08/2016 140   Corrected Ca 8.5 mg/dL  Assessment: 88 y/o female with h/o DDD, depression, anxiety, CKD III and HTN who is admitted with aspiration PNA, UTI and sepsis. Pharmacy is asked to follow and replace electrolytes while in CCU  Goal of Therapy:  Electrolytes WNL  Plan:  No electrolyte replacement warranted for today Recheck electrolytes in am  Alexandria Roman ,PharmD Clinical Pharmacist 10/09/2021 7:18 AM

## 2021-10-09 NOTE — Progress Notes (Signed)
PT Cancellation Note  Patient Details Name: Alexandria Roman MRN: [id number] DOB: [birth year]   Cancelled Treatment:    Reason Eval/Treat Not Completed: Other (comment). Declining PT services per pt and caregivers. Wanting to focus on decreasing stress and comfort. PT to sign off at this time.    Lieutenant Diego PT, DPT 11:06 AM,10/09/21

## 2021-10-09 NOTE — Progress Notes (Signed)
SLP Cancellation Note  Patient Details Name: Alexandria Roman MRN: [id number] DOB: Jun 29, 1929   Cancelled treatment:       Reason Eval/Treat Not Completed:  (chart reviewed; pt's Caregivers present in room.) Per pt's Caregivers, along w/ the decision by her POA, they said, stated pt is known to have "some swallowing issues"(large hiatal hernia) from a previous evaluation completed ~1 year ago, and pt does not want to be on thickened liquids nor a modified diet. When the Caregiver asked the pt of this, the pt nodded in agreement.  The Caregivers stated "pt has decided to enjoy eating/drinking what she wants" and that this has been agreed upon by pt, POA, and Caregivers to "respect her wishes". Caregivers stated she was eating at "[employer]" enjoying her meal prior to this admit before "vomiting" which led her to being hospitalized(pt has a large hiatal hernia and should follow REFLUX precautions). Also stated they are seeking Hospice services for D/C- referred them to the MD re: this issue. Caregivers stated they are awaiting to meet w/ the MD(along w/ the POA) to discuss pt's POC further. In light of decline for any ST services(for a swallowing evaluation), ST services will sign off at this time. MD to reconsult if any new needs arise. Pt/Caregivers agreed. MD updated, agreed.       Orinda Kenner, MS, CCC-SLP Speech Language Pathologist Rehab Services; [place] [phone] (ascom) Donnalee Cellucci 10/09/2021, 11:01 AM

## 2021-10-09 NOTE — Progress Notes (Signed)
Report called to receiving Rn 213. Will transfer via bed. Family aware of transfer.

## 2021-10-09 NOTE — Progress Notes (Signed)
OT Cancellation Note  Patient Details Name: [name] MRN: [id number] DOB: 1929-07-07   Cancelled Treatment:    Reason Eval/Treat Not Completed: Other (comment)Declining OT services per pt and caregivers. Wanting to focus on decreasing stress and comfort. OT to sign off at this time.   Darleen Crocker, MS, OTR/L , CBIS ascom [phone]  10/09/21, 11:38 AM

## 2021-10-10 DIAGNOSIS — E440 Moderate protein-calorie malnutrition: Secondary | ICD-10-CM | POA: Diagnosis not present

## 2021-10-10 DIAGNOSIS — E039 Hypothyroidism, unspecified: Secondary | ICD-10-CM | POA: Diagnosis not present

## 2021-10-10 DIAGNOSIS — T17908A Unspecified foreign body in respiratory tract, part unspecified causing other injury, initial encounter: Secondary | ICD-10-CM | POA: Diagnosis not present

## 2021-10-10 DIAGNOSIS — J9601 Acute respiratory failure with hypoxia: Secondary | ICD-10-CM | POA: Diagnosis not present

## 2021-10-10 LAB — CULTURE, BLOOD (ROUTINE X 2)
Culture: NO GROWTH
Culture: NO GROWTH

## 2021-10-10 LAB — GLUCOSE, CAPILLARY
Glucose-Capillary: 109 mg/dL — ABNORMAL HIGH (ref 70–99)
Glucose-Capillary: 128 mg/dL — ABNORMAL HIGH (ref 70–99)
Glucose-Capillary: 131 mg/dL — ABNORMAL HIGH (ref 70–99)
Glucose-Capillary: 139 mg/dL — ABNORMAL HIGH (ref 70–99)
Glucose-Capillary: 148 mg/dL — ABNORMAL HIGH (ref 70–99)
Glucose-Capillary: 165 mg/dL — ABNORMAL HIGH (ref 70–99)

## 2021-10-10 LAB — CULTURE, RESPIRATORY W GRAM STAIN

## 2021-10-10 MED ORDER — SODIUM CHLORIDE 0.9 % IV SOLN
2.0000 g | INTRAVENOUS | Status: DC
  Administered 2021-10-10: 2 g via INTRAVENOUS
  Filled 2021-10-10 (×2): qty 20

## 2021-10-10 MED ORDER — FLEET ENEMA 7-19 GM/118ML RE ENEM
1.0000 | ENEMA | Freq: Every day | RECTAL | Status: DC | PRN
  Administered 2021-10-10: 1 via RECTAL

## 2021-10-10 MED ORDER — BISACODYL 10 MG RE SUPP
10.0000 mg | Freq: Once | RECTAL | Status: AC
Start: 2021-10-10 — End: 2021-10-10
  Administered 2021-10-10: 10 mg via RECTAL
  Filled 2021-10-10: qty 1

## 2021-10-10 MED ORDER — INSULIN ASPART 100 UNIT/ML IJ SOLN
0.0000 [IU] | Freq: Three times a day (TID) | INTRAMUSCULAR | Status: DC
  Administered 2021-10-10: 2 [IU] via SUBCUTANEOUS
  Administered 2021-10-10: 3 [IU] via SUBCUTANEOUS
  Administered 2021-10-11: 2 [IU] via SUBCUTANEOUS
  Filled 2021-10-10 (×4): qty 1

## 2021-10-10 NOTE — Progress Notes (Signed)
[facility] NAME: Alexandria Roman    MR#:  [id number]  DATE OF BIRTH:  1929/10/23  SUBJECTIVE:   Patient awake and alert. Patient's home care nurses at bedside.  Had bilious vomiting earlier. Constipated Suppository given VITALS:  Blood pressure (!) 173/116, pulse 65, temperature 97.6 F (36.4 C), temperature source Axillary, resp. rate 18, height '5\' 4"'$  (1.626 m), weight 68.4 kg, SpO2 95 %.  PHYSICAL EXAMINATION:   GENERAL:  88 y.o.-year-old patient lying in the bed with no acute distress. Weak frail LUNGS: decreased breath sounds bilaterally, no wheezing  CARDIOVASCULAR: S1, S2 normal. No murmurs ABDOMEN: Soft, nontender, nondistended.  EXTREMITIES: No  edema b/l.    NEUROLOGIC: nonfocal  patient is alert and awake, weak SKIN: per RN  LABORATORY PANEL:  CBC Recent Labs  Lab 10/09/21 0541  WBC 17.7*  HGB 11.9*  HCT 37.3  PLT 228     Chemistries  Recent Labs  Lab 10/06/21 0536 10/07/21 0452 10/09/21 0541  NA 137   < > 146*  K 4.5   < > 4.0  CL 106   < > 114*  CO2 20*   < > 24  GLUCOSE 137*   < > 108*  BUN 47*   < > 47*  CREATININE 1.68*   < > 1.15*  CALCIUM 8.3*   < > 8.8*  MG  --    < > 2.4  AST 26  --   --   ALT 10  --   --   ALKPHOS 37*  --   --   BILITOT 1.1  --   --    < > = values in this interval not displayed.    Cardiac Enzymes No results for input(s): "TROPONINI" in the last 168 hours. RADIOLOGY:  No results found.  Assessment and Plan Debralee Roman is a 88 year old female with a past medical history significant for COPD, hypertension, CKD stage III, chronic pain, hypothyroidism, hyperlipidemia, depression, anxiety who presented to [facility] ED on 10/05/2020 due to complaints of progressive shortness of breath.   She reported she had an episode of vomiting after eating seafood on Sunday, which she then developed progressive shortness of breath.  She lives in the independent living facility and has  nursing assistance for ADLs.  When her nurse checked on her she was noted to be tachypneic, of which EMS was dispatched.  Upon EMS arrival she was noted to be hypoxic with O2 sats in the 70s which she was placed on nasal cannula.   Upon arrival to the ED she was placed on BiPAP due to acute respiratory distress. Patient deteriorated and was thereafter intubated and placed on the ventilator.  Chest X-ray>>IMPRESSION: 1. Bilateral lower lung field opacities may represent developing infiltrate or aspiration. 2. Large hiatal hernia   acute hypoxic respiratory failure secondary to aspiration severe sepsis due to aspiration pneumonia and UTI -- patient now extubated. She is DNR DNI -- cont rocephin --Tracheal aspirate--Pseudomonas -- remains afebrile. -- Wean oxygen down to keep sats greater than 92% -- sepsis improved  AK I on CKD stage IIIa -- improving -- received IV fluids --Monitor UOP - Avoid nephrotoxic medications   Chronic dysphagia -- per patient's home care RN anymore son who has discussed with patient's POA Marya Amsler they would like patient to be on her previous diet with thin liquid. They declined speech therapy to see. They understand patient is at a high risk for aspiration. --  Will give food for pleasure  Hypothyroidism -- continue Synthroid  Generalized weakness, deconditioning -- Dr. Mortimer Fries has discussed with patient's [place] and home healthcare RN Desmond Dike-- plan is to discharge patient to home with hospice. They declined PT OT to see patient.  Constipation -- give bowel regimen  Nutrition Status: Nutrition Problem: Moderate Malnutrition Etiology: social / environmental circumstances (advanced age) Signs/Symptoms: moderate fat depletion, moderate muscle depletion    Procedures: mechanical intubation Family communication : Therapist, nutritional (>1 year) who is in touch with Marya Amsler Education officer, community) Consults :PCCM CODE STATUS: DNR/DNI DVT Prophylaxis  : Level of care: Med-Surg Status is: Inpatient Remains inpatient appropriate because: will go home on Monday once home hospice has been set up    [place]: 35 minutes.  >50% time spent on counselling and coordination of care  Note: This dictation was prepared with Dragon dictation along with smaller phrase technology. Any transcriptional errors that result from this process are unintentional.  Fritzi Mandes M.D    [facility]   CC: Primary care physician; Adin Hector, MD

## 2021-10-11 ENCOUNTER — Inpatient Hospital Stay: Payer: Medicare PPO

## 2021-10-11 DIAGNOSIS — E039 Hypothyroidism, unspecified: Secondary | ICD-10-CM | POA: Diagnosis not present

## 2021-10-11 DIAGNOSIS — E440 Moderate protein-calorie malnutrition: Secondary | ICD-10-CM | POA: Diagnosis not present

## 2021-10-11 DIAGNOSIS — J9601 Acute respiratory failure with hypoxia: Secondary | ICD-10-CM | POA: Diagnosis not present

## 2021-10-11 DIAGNOSIS — T17908A Unspecified foreign body in respiratory tract, part unspecified causing other injury, initial encounter: Secondary | ICD-10-CM | POA: Diagnosis not present

## 2021-10-11 LAB — GLUCOSE, CAPILLARY
Glucose-Capillary: 134 mg/dL — ABNORMAL HIGH (ref 70–99)
Glucose-Capillary: 134 mg/dL — ABNORMAL HIGH (ref 70–99)
Glucose-Capillary: 92 mg/dL (ref 70–99)

## 2021-10-11 MED ORDER — SODIUM CHLORIDE 0.9 % IV SOLN
12.5000 mg | Freq: Once | INTRAVENOUS | Status: AC
  Administered 2021-10-11: 12.5 mg via INTRAVENOUS
  Filled 2021-10-11: qty 12.5

## 2021-10-11 MED ORDER — MORPHINE SULFATE (PF) 2 MG/ML IV SOLN
1.0000 mg | INTRAVENOUS | Status: DC | PRN
  Administered 2021-10-11 (×2): 2 mg via INTRAVENOUS
  Filled 2021-10-11 (×2): qty 1

## 2021-10-11 MED ORDER — SODIUM CHLORIDE 0.9 % IV SOLN: INTRAVENOUS | Status: DC

## 2021-10-16 NOTE — Progress Notes (Signed)
Chaplain responded to rapid response.  Pt is receiving care, no family present. Checked in with RN and sec.  Please contact as needed for support.  Minus [place], [place] Pager:  [phone]   12-Oct-2021 0800  Clinical Encounter Type  Visited With Patient not available  Visit Type Critical Care  Referral From Nurse  Consult/Referral To Chaplain  Stress Factors  Patient Stress Factors Health changes

## 2021-10-16 NOTE — Progress Notes (Signed)
Patients was gurgling when I entered the room. Her skin color was dusky. She was vomiting brown bile. Rapid response called. NG and foley catheter placed. Dr Posey Pronto was at the bedside.

## 2021-10-16 NOTE — Progress Notes (Addendum)
Spoke with Marya Amsler again and updated abot KUB/CXR, Placing NG tube, Foley and plan of care. Marya Amsler understands pt may not do well. Discussed about comfort care if pt does not show improvement. He is in agreement. Given tachypnea and current respiratory status will hold off bowel prep. Will reassess and consider accordingly.   Pt passed away at 1250

## 2021-10-16 NOTE — Progress Notes (Addendum)
[facility] [place] NAME: Alexandria Roman    MR#:  [id number]  DATE OF BIRTH:  24-Sep-1929  SUBJECTIVE:   Called RN to see patient. She was actively vomiting what looks like fecal material. Tachycardia tachypnea. NG placed with dark bilious/fecal matter. Patient did receive Dulcolax and fleets enema yesterday. Did have some hard stool per RN patient sats dropped in the 76s. Placed on on rebreather. She likely aspirated. VITALS:  Blood pressure 126/78, pulse 87, temperature 98.3 F (36.8 C), resp. rate 20, height '5\' 4"'$  (1.626 m), weight 68.4 kg, SpO2 (!) 71 %.  PHYSICAL EXAMINATION:   GENERAL:  88 y.o.-year-old patient lying in the bed with  acute distress. Weak frail, deconditioned LUNGS: decreased breath sounds bilaterally, no wheezing tachypnea CARDIOVASCULAR: S1, S2 normal. No murmurs tachycardia ABDOMEN: Soft, nontender +distended. Foley placed+ EXTREMITIES: trace edema   NEUROLOGIC: nonfocal  patient is sleepy, weak SKIN: per RN  LABORATORY PANEL:  CBC Recent Labs  Lab 10/09/21 0541  WBC 17.7*  HGB 11.9*  HCT 37.3  PLT 228     Chemistries  Recent Labs  Lab 10/06/21 0536 10/07/21 0452 10/09/21 0541  NA 137   < > 146*  K 4.5   < > 4.0  CL 106   < > 114*  CO2 20*   < > 24  GLUCOSE 137*   < > 108*  BUN 47*   < > 47*  CREATININE 1.68*   < > 1.15*  CALCIUM 8.3*   < > 8.8*  MG  --    < > 2.4  AST 26  --   --   ALT 10  --   --   ALKPHOS 37*  --   --   BILITOT 1.1  --   --    < > = values in this interval not displayed.    Cardiac Enzymes No results for input(s): "TROPONINI" in the last 168 hours. RADIOLOGY:  No results found.  Assessment and Plan Alexandria Roman is a 88 year old female with a past medical history significant for COPD, hypertension, CKD stage III, chronic pain, hypothyroidism, hyperlipidemia, depression, anxiety who presented to [facility] ED on 10/05/2020 due to complaints of progressive shortness of  breath.   She reported she had an episode of vomiting after eating seafood on Sunday, which she then developed progressive shortness of breath.  She lives in the independent living facility and has nursing assistance for ADLs.  When her nurse checked on her she was noted to be tachypneic, of which EMS was dispatched.  Upon EMS arrival she was noted to be hypoxic with O2 sats in the 70s which she was placed on nasal cannula.   Upon arrival to the ED she was placed on BiPAP due to acute respiratory distress. Patient deteriorated and was thereafter intubated and placed on the ventilator.  Chest X-ray>>IMPRESSION: 1. Bilateral lower lung field opacities may represent developing infiltrate or aspiration. 2. Large hiatal hernia   acute hypoxic respiratory failure secondary to aspiration severe sepsis due to aspiration pneumonia and UTI -- patient now extubated. She is DNR DNI -- cont rocephin --Tracheal aspirate--Pseudomonas -- remains afebrile. -- Wean oxygen down to keep sats greater than 92% -- sepsis improved --8/27-- likely aspirated this morning with vomiting -- continue IV antibiotic, NPO, start IV fluid -- PRN morphine for respiratory distress--- discussed with grandson HC POA [name] on the phone and updated patient's status. Did discuss with grandson about her  critical nature of illness. -- Discussed comfort care and hospice if patient continues to decline.  Intractable vomiting with respiratory distress Constipation -NG placed, Foley catheter placed  -- patient did receive Dulcolax suppository and enema yesterday--had some hard stools. RN manually removed some hard stool -- will check stat chest x-ray and KUB --enema once pt settles down and able to tolerate  AK I on CKD stage IIIa Alexandria Roman -- received IV fluids --Monitor UOP - Avoid nephrotoxic medications   Chronic dysphagia -- per patient's home care RN anymore son who has discussed with patient's POA Marya Amsler they would like  patient to be on her previous diet with thin liquid. They declined speech therapy to see. They understand patient is at a high risk for aspiration. -- will keep pt NPO  Hypothyroidism --  Synthroid  Generalized weakness, deconditioning -- Dr. Mortimer Fries has discussed with patient's [place] and home healthcare RN Desmond Dike-- plan is to discharge patient to home with hospice. They declined PT OT to see patient.  Nutrition Status: Nutrition Problem: Moderate Malnutrition Etiology: social / environmental circumstances (advanced age) Signs/Symptoms: moderate fat depletion, moderate muscle depletion     Overall poor prognosis  Procedures: mechanical intubation Family communication :  [name] (HCPOA)--on the phone today Consults :PCCM CODE STATUS: DNR/DNI DVT Prophylaxis : Level of care: Med-Surg Status is: Inpatient Remains inpatient appropriate because: respiratory distress, constipation, vomiting, aspiration   TOTAL TIME TAKING CARE OF THIS PATIENT: 35 minutes.  >50% time spent on counselling and coordination of care  Note: This dictation was prepared with Dragon dictation along with smaller phrase technology. Any transcriptional errors that result from this process are unintentional.  Fritzi Mandes M.D    [facility]   CC: Primary care physician; Adin Hector, MD

## 2021-10-16 NOTE — Death Summary Note (Signed)
DEATH SUMMARY   Patient Details  Name: Alexandria Roman MRN: [id number] DOB: 09-07-1929 [id number], Tonette Bihari, MD Admission/Discharge Information   Admit Date:  10-25-2021  Date of Death: Date of Death: 10-31-21  Time of Death: Time of Death: 1250  Length of Stay: 6   Principle Cause of death: aspiration pneumonia   Discharge Diagnoses: Aspiration pneumonia Acute Respiraotry failure with hypoxia  Hospital Course: Alexandria Roman is a 88 year old female with a past medical history significant for COPD, hypertension, CKD stage III, chronic pain, hypothyroidism, hyperlipidemia, depression, anxiety who presented to [facility] ED on 10-25-20 due to complaints of progressive shortness of breath.   She reported she had an episode of vomiting after eating seafood on Sunday, which she then developed progressive shortness of breath.  She lives in the independent living facility and has nursing assistance for ADLs. Chest X-ray>>IMPRESSION: 1. Bilateral lower lung field opacities may represent developing infiltrate or aspiration. 2. Large hiatal hernia  acute hypoxic respiratory failure secondary to aspiration severe sepsis due to aspiration pneumonia and UTI -- patient presented with increasing shortness of breath with respiratory distress was intubated in the emergency room and admitted to ICU. She was extubated and remained a DNR DNI. Patient was continued on IV antibiotic. She was transferred to the floor. She patient has history of chronic dysphagia and started having nausea vomiting. She was constipated did receive bowel prep. She started developing intractable nausea vomiting. NG was placed and fecal material along with bilious material was suction. Patient was placed on non-rebreather. She continued to decline. Family discussion was made. There are aware of or prognosis. Patient expired on 01-Nov-2022  Other diagnosis constipation, intractable nausea vomiting, chronic dysphagia, moderate  malnutrition       The results of significant diagnostics from this hospitalization (including imaging, microbiology, ancillary and laboratory) are listed below for reference.   Significant Diagnostic Studies: DG Chest Port 1 View  Result Date: 10-31-21 CLINICAL DATA:  Shortness of breath EXAM: PORTABLE CHEST 1 VIEW COMPARISON:  Previous studies including the examination of 10/06/2021 FINDINGS: Transverse diameter of heart is increased. Tip of NG tube appears to be lying superior to the right hemidiaphragm within large hiatal hernia. Central pulmonary vessels are more prominent. Increased interstitial and alveolar markings are seen in the parahilar regions and lower lung fields. Small bilateral pleural effusions are seen. There is no pneumothorax. IMPRESSION: Central pulmonary vessels are prominent suggesting CHF. Increased interstitial and alveolar markings are seen in the parahilar regions and lower lung fields suggesting pulmonary edema and possibly underlying pneumonia. Small bilateral pleural effusions. Electronically Signed   By: Elmer Picker M.D.   On: October 31, 2021 08:52   DG Abd 1 View  Result Date: 10-31-2021 CLINICAL DATA:  Abdominal distention EXAM: ABDOMEN - 1 VIEW COMPARISON:  10/08/2021 FINDINGS: There is possible mild dilation of small bowel loops in left side of abdomen. Stomach is not distended. Gas and stool are present in colon. Surgical clips are seen in both sides of pelvis. There is previous right hip arthroplasty. Degenerative changes are noted in the lumbar spine. There is possible vertebroplasty in body of L1 vertebra. IMPRESSION: There is mild dilation of small-bowel loops suggesting possible ileus. Electronically Signed   By: Elmer Picker M.D.   On: 10-31-21 08:48   DG Abd 1 View  Result Date: 10/08/2021 CLINICAL DATA:  Abdominal pain EXAM: ABDOMEN - 1 VIEW COMPARISON:  None Available. FINDINGS: Bowel gas pattern is nonspecific. No abnormal masses or  calcifications  are seen. Surgical clips are seen in pelvis. There is previous right hip arthroplasty. There is previous vertebroplasty in the L1 vertebra. There is increased density in left lower lung field. Degenerative changes are noted in lumbar spine, more so at the L4-L5 and L5-S1 levels. IMPRESSION: Nonspecific bowel gas pattern. Electronically Signed   By: Elmer Picker M.D.   On: 10/08/2021 08:16   DG Chest Port 1 View  Result Date: 10/06/2021 CLINICAL DATA:  Endotracheal tube, central line, NG placement EXAM: PORTABLE CHEST 1 VIEW COMPARISON:  10/15/2021 FINDINGS: Endotracheal tube in good position Left jugular central venous catheter tip in the SVC. No pneumothorax NG tube extends into a large hiatal hernia to the right of midline. NG tube is not extend below the diaphragm. Heart size and vascularity normal. Negative for heart failure. Left lower lobe atelectasis and small left effusion Bilateral rotator cuff impingement. Degenerative changes in the right acromion. IMPRESSION: Endotracheal tube and central line in good position NG tube is coiled within a large hiatal hernia above the diaphragm on the right. Left lower lobe atelectasis and small left effusion. Electronically Signed   By: Franchot Gallo M.D.   On: 10/06/2021 13:07   DG Chest Port 1 View  Result Date: 10/09/2021 CLINICAL DATA:  Hypoxia. EXAM: PORTABLE CHEST 1 VIEW COMPARISON:  Chest radiograph and CT dated 08/05/2020. FINDINGS: Bilateral lower lung field opacities new since the radiograph of 08/05/2020 and may represent developing infiltrate or aspiration. There is a large hiatal hernia containing the majority of the stomach. No large pleural effusion. No pneumothorax. The cardiac silhouette is within limits. Atherosclerotic calcification of the aorta. No acute osseous pathology. IMPRESSION: 1. Bilateral lower lung field opacities may represent developing infiltrate or aspiration. 2. Large hiatal hernia. Electronically Signed    By: Anner Crete M.D.   On: 10/08/2021 21:41    Microbiology: Recent Results (from the past 240 hour(s))  Blood culture (routine x 2)     Status: None   Collection Time: 09/30/2021  9:40 PM   Specimen: BLOOD  Result Value Ref Range Status   Specimen Description BLOOD RIGHT ASSIST CONTROL  Final   Special Requests   Final    BOTTLES DRAWN AEROBIC AND ANAEROBIC Blood Culture results may not be optimal due to an inadequate volume of blood received in culture bottles   Culture   Final    NO GROWTH 5 DAYS Performed at [facility], [address], [place], [place] [address]    Report Status 10/10/2021 FINAL  Final  SARS Coronavirus 2 by RT PCR (hospital order, performed in [facility] hospital lab) *cepheid single result test* Anterior Nasal Swab     Status: None   Collection Time: 10/08/2021  9:54 PM   Specimen: Anterior Nasal Swab  Result Value Ref Range Status   SARS Coronavirus 2 by RT PCR NEGATIVE NEGATIVE Final    Comment: (NOTE) SARS-CoV-2 target nucleic acids are NOT DETECTED.  The SARS-CoV-2 RNA is generally detectable in upper and lower respiratory specimens during the acute phase of infection. The lowest concentration of SARS-CoV-2 viral copies this assay can detect is 250 copies / mL. A negative result does not preclude SARS-CoV-2 infection and should not be used as the sole basis for treatment or other patient management decisions.  A negative result may occur with improper specimen collection / handling, submission of specimen other than nasopharyngeal swab, presence of viral mutation(s) within the areas targeted by this assay, and inadequate number of viral copies (<  250 copies / mL). A negative result must be combined with clinical observations, patient history, and epidemiological information.  Fact Sheet for Patients:   https://www..info/  Fact Sheet for Healthcare Providers: https://hall.com/  This test is  not yet approved or  cleared by the [place] FDA and has been authorized for detection and/or diagnosis of SARS-CoV-2 by FDA under an Emergency Use Authorization (EUA).  This EUA will remain in effect (meaning this test can be used) for the duration of the COVID-19 declaration under Section 564(b)(1) of the Act, 21 U.S.C. section 360bbb-3(b)(1), unless the authorization is terminated or revoked sooner.  Performed at [facility], [address] [place], [place] [address]   Blood culture (routine x 2)     Status: None   Collection Time: 09/22/2021 10:00 PM   Specimen: BLOOD  Result Value Ref Range Status   Specimen Description BLOOD LEFT ASSIST CONTROL  Final   Special Requests   Final    BOTTLES DRAWN AEROBIC ONLY Blood Culture results may not be optimal due to an inadequate volume of blood received in culture bottles   Culture   Final    NO GROWTH 5 DAYS Performed at [facility], [address], [place], [place] [address]    Report Status 10/10/2021 FINAL  Final  Urine Culture     Status: None   Collection Time: 10/06/21  2:25 AM   Specimen: In/Out Cath Urine  Result Value Ref Range Status   Specimen Description   Final    IN/OUT CATH URINE Performed at [facility], [address], [place], [place] [address]    Special Requests   Final    NONE Performed at [facility], [address] [place], [place] [address]    Culture   Final    NO GROWTH Performed at [facility], [place] [address] [place], [place] [address]    Report Status 10/07/2021 FINAL  Final  MRSA Next Gen by PCR, Nasal     Status: None   Collection Time: 10/06/21 10:14 AM   Specimen: Nasal Mucosa; Nasal Swab  Result Value Ref Range Status   MRSA by PCR Next Gen NOT DETECTED NOT DETECTED Final    Comment: (NOTE) The GeneXpert MRSA Assay (FDA approved for NASAL specimens only), is one component of a comprehensive MRSA colonization surveillance program. It  is not intended to diagnose MRSA infection nor to guide or monitor treatment for MRSA infections. Test performance is not FDA approved in patients less than 61 years old. Performed at [facility], [address] [place], [place] [address]   Culture, Respiratory w Gram Stain     Status: None   Collection Time: 10/06/21 11:31 AM   Specimen: Tracheal Aspirate; Respiratory  Result Value Ref Range Status   Specimen Description   Final    TRACHEAL ASPIRATE Performed at [facility], [address] [place], [place] [address]    Special Requests   Final    NONE Performed at [facility], [address] [place], [place] [address]    Gram Stain   Final    MODERATE WBC PRESENT, PREDOMINANTLY PMN RARE BUDDING YEAST SEEN RARE GRAM NEGATIVE RODS Performed at [facility], [place] [address] [place],  [address]    Culture FEW ENTEROBACTER AEROGENES  Final   Report Status 10/10/2021 FINAL  Final   Organism ID, Bacteria ENTEROBACTER AEROGENES  Final      Susceptibility  Enterobacter aerogenes - MIC*    CEFAZOLIN RESISTANT Resistant     CEFEPIME <=0.12 SENSITIVE Sensitive     CEFTAZIDIME <=1 SENSITIVE Sensitive     CEFTRIAXONE <=0.25 SENSITIVE Sensitive     CIPROFLOXACIN <=0.25 SENSITIVE Sensitive     GENTAMICIN <=1 SENSITIVE Sensitive     IMIPENEM <=0.25 SENSITIVE Sensitive     TRIMETH/SULFA <=20 SENSITIVE Sensitive     PIP/TAZO <=4 SENSITIVE Sensitive     * FEW ENTEROBACTER AEROGENES  Respiratory (~20 pathogens) panel by PCR     Status: None   Collection Time: 10/06/21  1:24 PM  Result Value Ref Range Status   Adenovirus NOT DETECTED NOT DETECTED Final   Coronavirus 229E NOT DETECTED NOT DETECTED Final    Comment: (NOTE) The Coronavirus on the Respiratory Panel, DOES NOT test for the novel  Coronavirus (2019 nCoV)    Coronavirus HKU1 NOT DETECTED NOT DETECTED Final   Coronavirus NL63 NOT DETECTED NOT DETECTED Final   Coronavirus OC43 NOT  DETECTED NOT DETECTED Final   Metapneumovirus NOT DETECTED NOT DETECTED Final   Rhinovirus / Enterovirus NOT DETECTED NOT DETECTED Final   Influenza A NOT DETECTED NOT DETECTED Final   Influenza B NOT DETECTED NOT DETECTED Final   Parainfluenza Virus 1 NOT DETECTED NOT DETECTED Final   Parainfluenza Virus 2 NOT DETECTED NOT DETECTED Final   Parainfluenza Virus 3 NOT DETECTED NOT DETECTED Final   Parainfluenza Virus 4 NOT DETECTED NOT DETECTED Final   Respiratory Syncytial Virus NOT DETECTED NOT DETECTED Final   Bordetella pertussis NOT DETECTED NOT DETECTED Final   Bordetella Parapertussis NOT DETECTED NOT DETECTED Final   Chlamydophila pneumoniae NOT DETECTED NOT DETECTED Final   Mycoplasma pneumoniae NOT DETECTED NOT DETECTED Final    Comment: Performed at [facility], [place] [address] [place], [place] [address]    Time spent: 20 minutes  Signed: Fritzi Mandes, MD 10/15/21

## 2021-10-16 NOTE — Significant Event (Signed)
Rapid Response Event Note   Reason for Call : called for decreased 02 sats, increased HR, and vomit of stool appearing liquid   Initial Focused Assessment: Laying in bed, labored breathing, NRB mask in place, Tachycardic on monitor... VS- see flowsheets for details.      Interventions: Dr Posey Pronto to bedside, NGT placed, Foley Placed, made NPO, stat CXR and KUB, Fluids ordered, morphine given. Dr Posey Pronto spoke with [facility] to make aware of situation. No escalation, pt is suppose to go home with hospice Monday morning.   Plan of Care: as above, RN Erica to call for further assistance.    Event Summary: as above  MD Notified: Dr Posey Pronto [phone] Call Goodman A, RN

## 2021-10-16 DEATH — deceased

## 2022-06-30 IMAGING — CT CT HEAD W/O CM
1 series · 16 of 30 positions shown, 20 images · non-contrast
Comparison: 08/05/2020

CLINICAL DATA: Fall 3 times at rest home.

EXAM:
CT HEAD WITHOUT CONTRAST
TECHNIQUE: Contiguous axial images were obtained from the base of the skull
through the vertex without intravenous contrast.

[Series 2: head wo · axial · 0.43mm/px · z∈[-189,-54]mm · 16 of 31 slices shown, 20 images]
[im 2/31  brain]
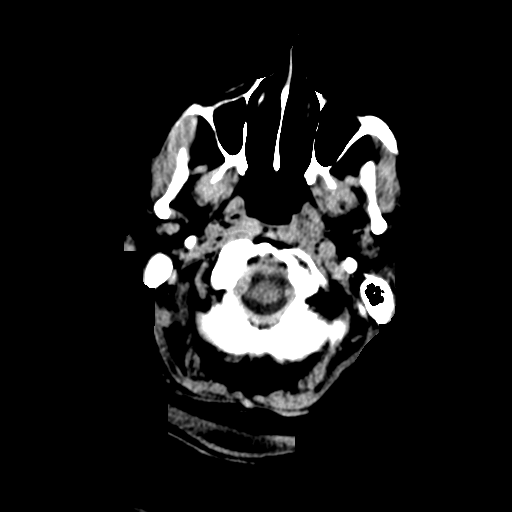
[im 2/31  bone]
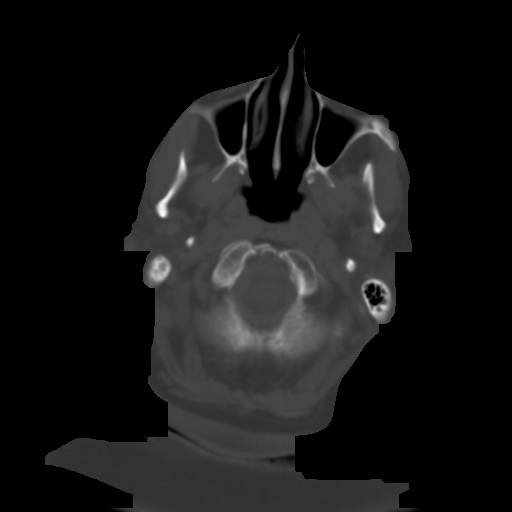
[im 4/31  brain]
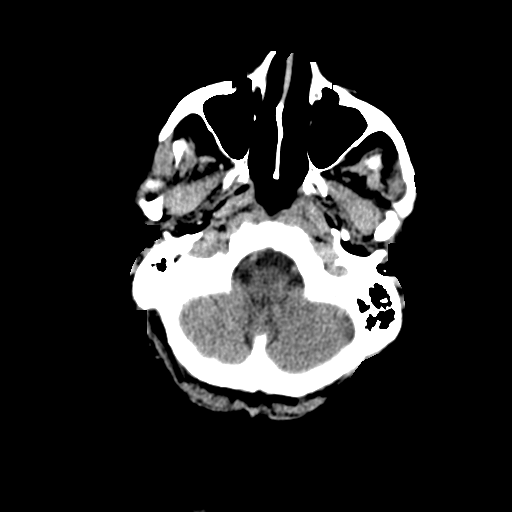
[im 6/31  brain]
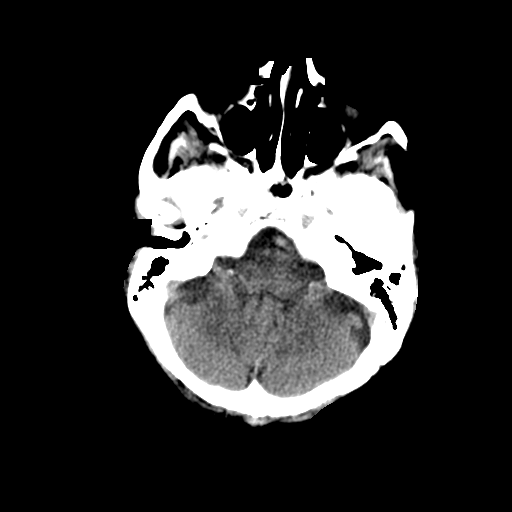
[im 8/31  brain]
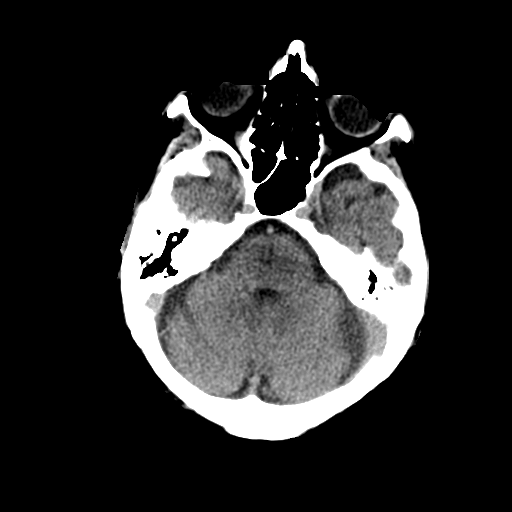
[im 9/31  brain]
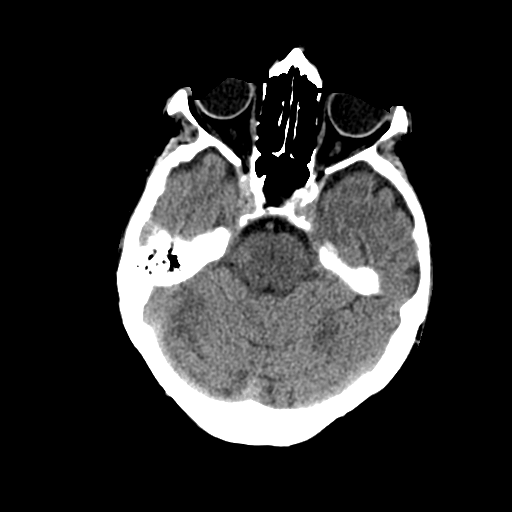
[im 9/31  bone]
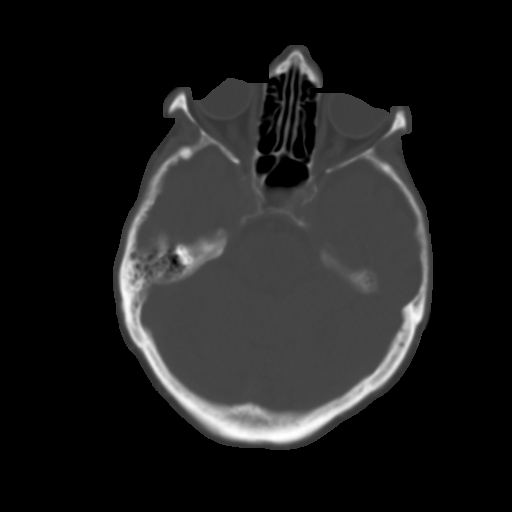
[im 11/31  brain]
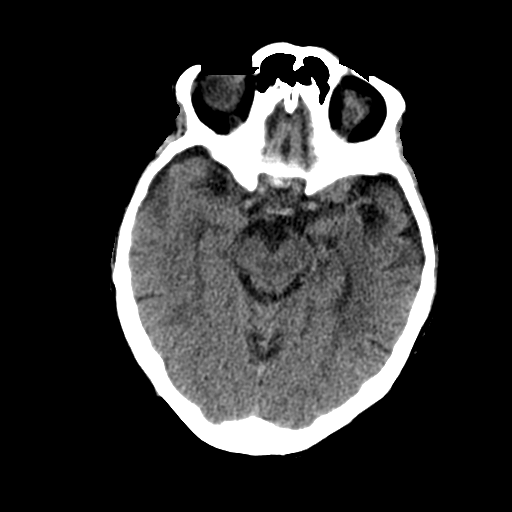
[im 13/31  brain]
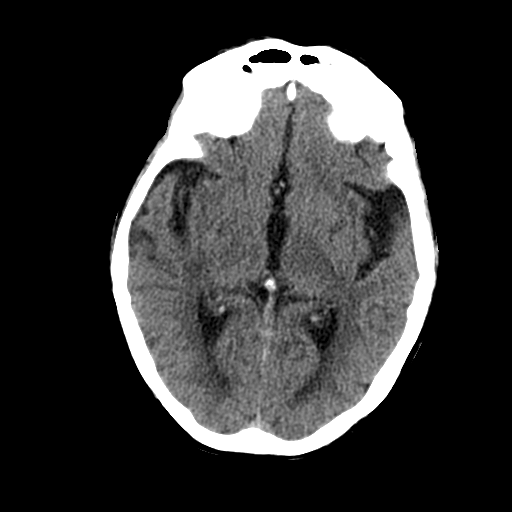
[im 15/31  brain]
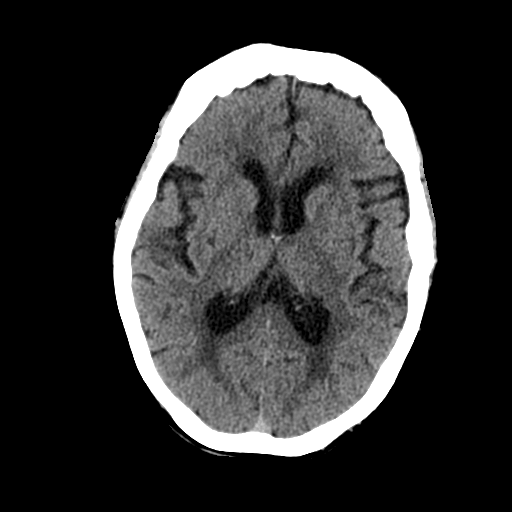
[im 16/31  brain]
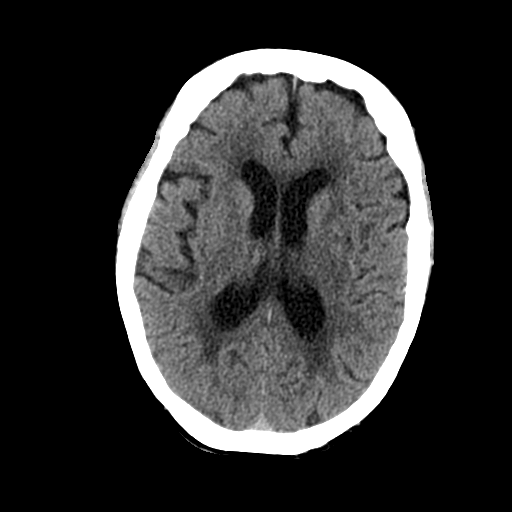
[im 16/31  bone]
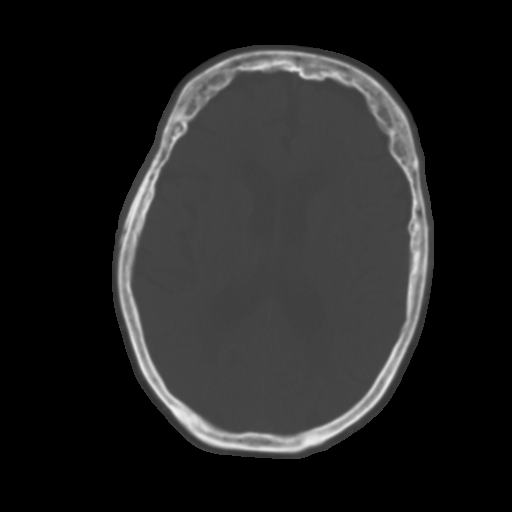
[im 18/31  brain]
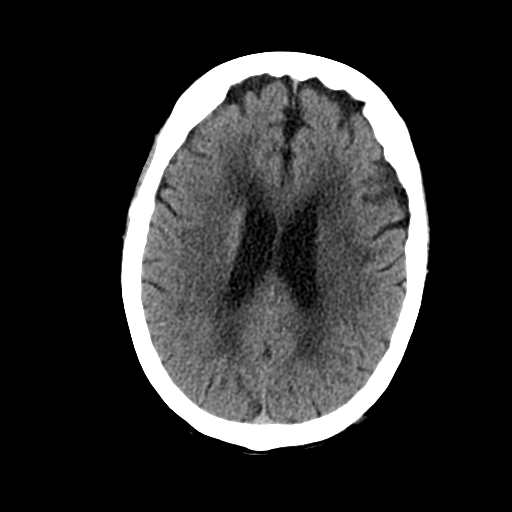
[im 20/31  brain]
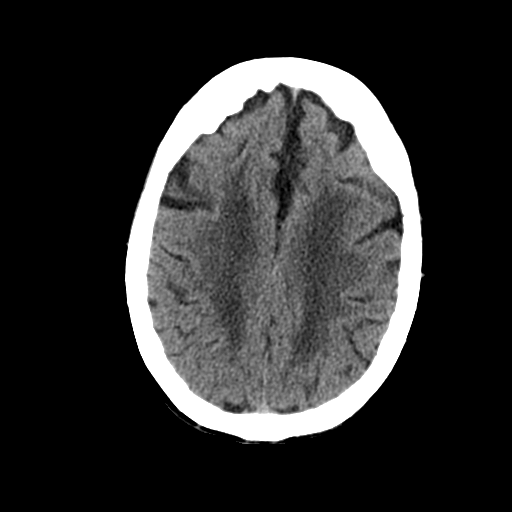
[im 22/31  brain]
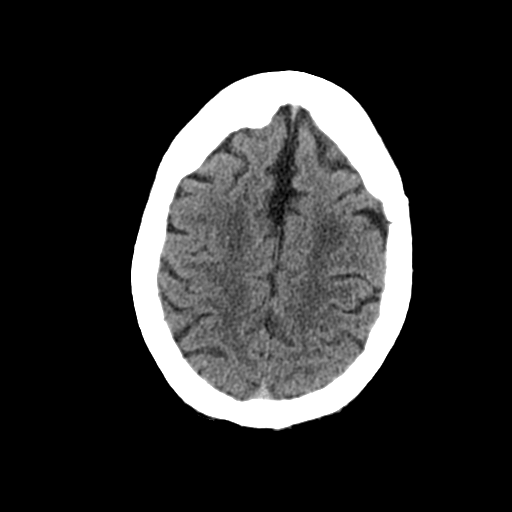
[im 23/31  brain]
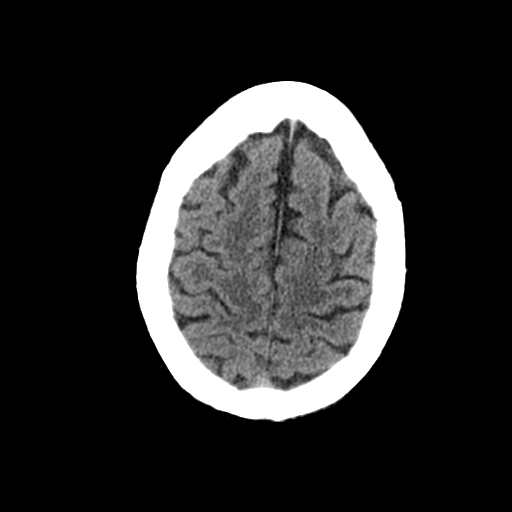
[im 23/31  bone]
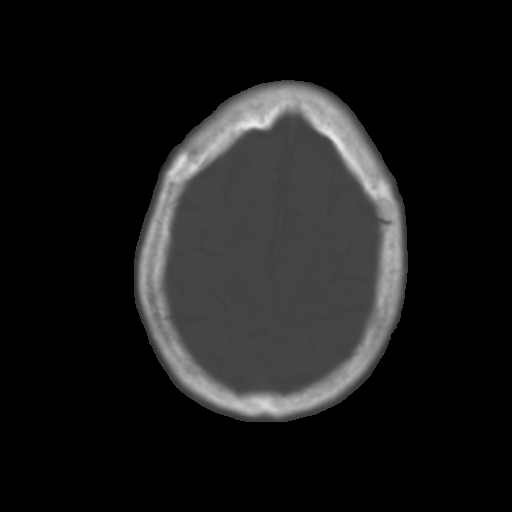
[im 25/31  brain]
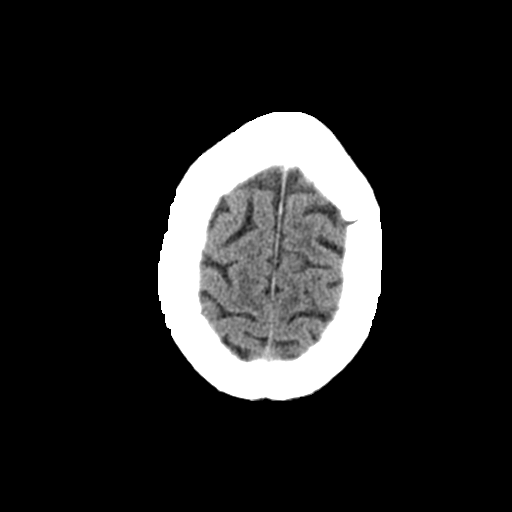
[im 27/31  brain]
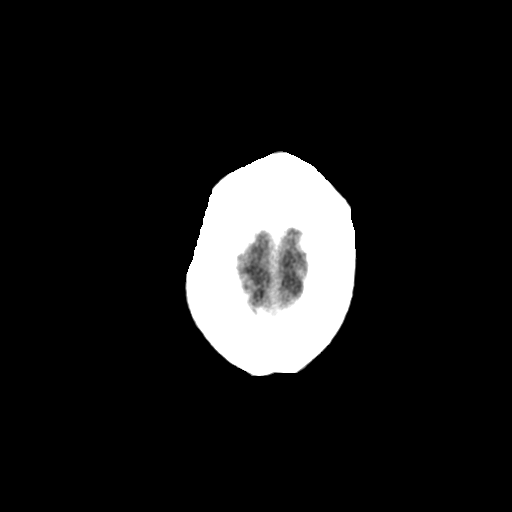
[im 29/31  brain]
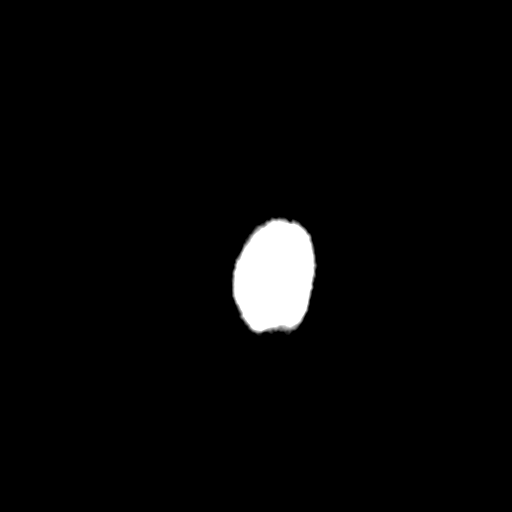

[16 of 30 positions shown; findings below may reference images not displayed]

FINDINGS: Brain: No evidence of acute infarction, hemorrhage, hydrocephalus,
extra-axial collection or mass lesion/mass effect. Generalized
atrophy and chronic small vessel ischemia.

Vascular: No hyperdense vessel or unexpected calcification.

Skull: Normal. Negative for fracture or focal lesion.

Sinuses/Orbits: No acute finding.
IMPRESSION: No acute finding or change from July 2020.

Aging brain.

## 2022-06-30 IMAGING — US US CAROTID DUPLEX BILAT
1 series · 14 of 24 positions shown · non-contrast
Comparison: None.

CLINICAL DATA: Slurred speech

Dizziness
Hypertension
Syncope
Hyperlipidemia
EXAM:
BILATERAL CAROTID DUPLEX ULTRASOUND
TECHNIQUE: Gray scale imaging, color Doppler and duplex ultrasound were
performed of bilateral carotid and vertebral arteries in the neck.

[Series 1: us carotid duplex bilat · 0.06mm/px · 14 of 64 slices shown]
[im 1/64]
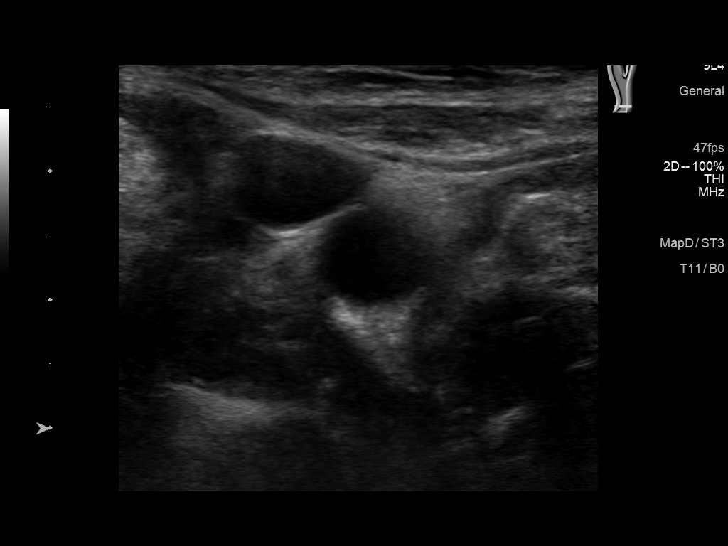
[im 6/64]
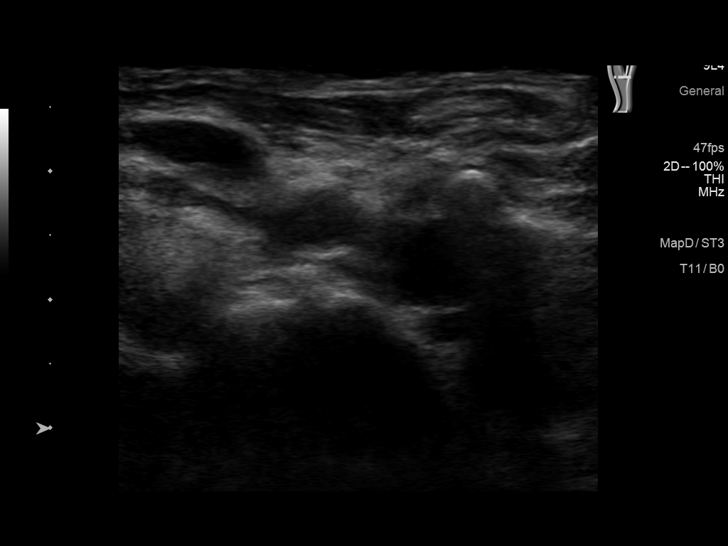
[im 11/64]
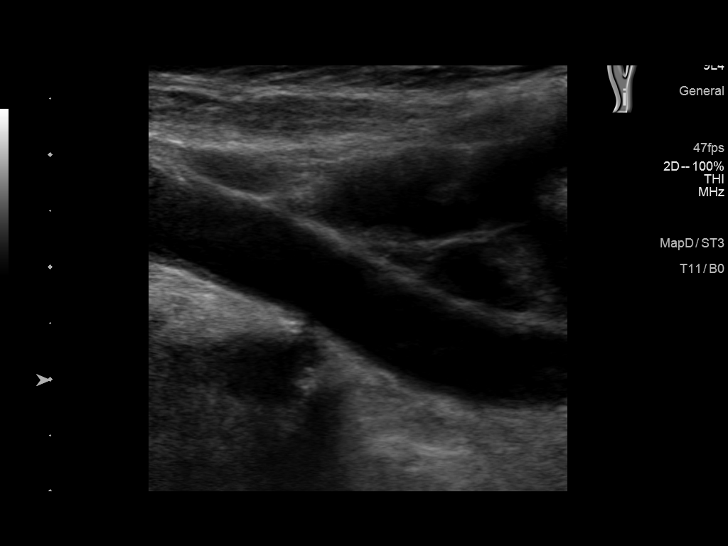
[im 17/64]
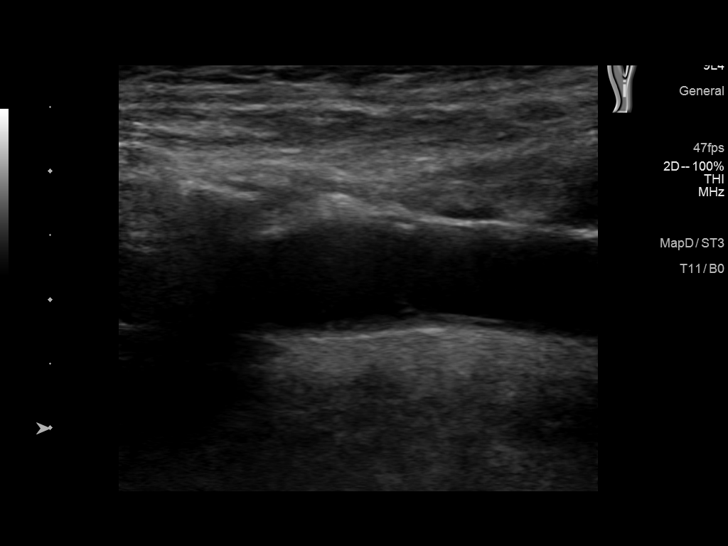
[im 20/64]
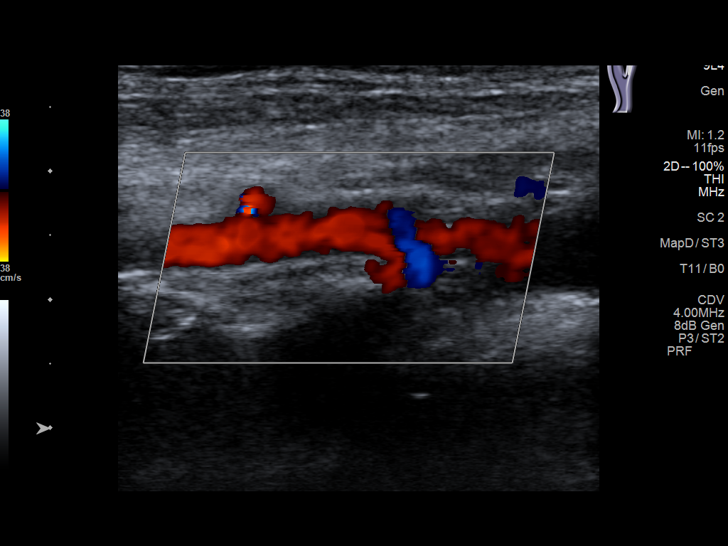
[im 25/64]
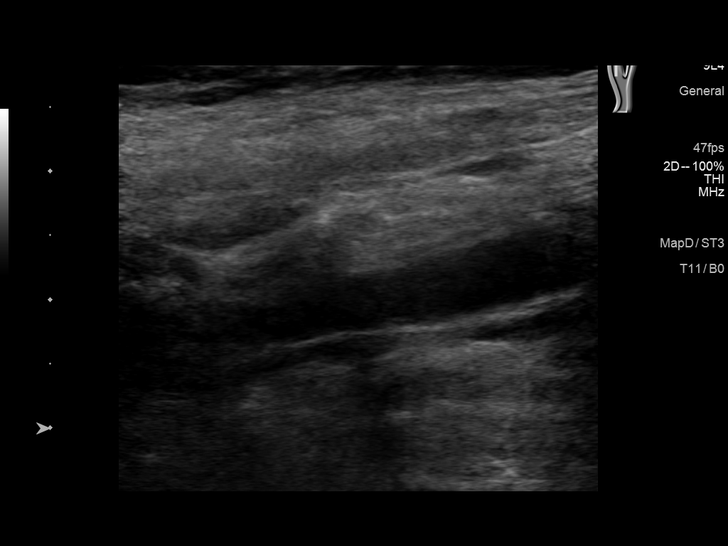
[im 31/64]
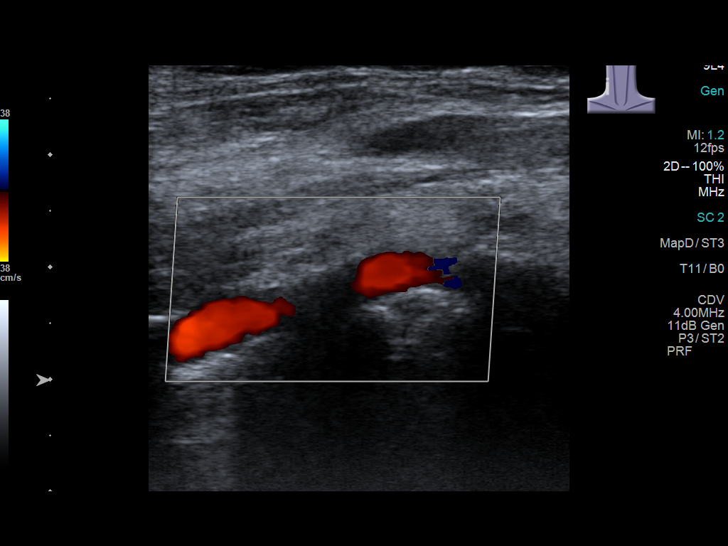
[im 33/64]
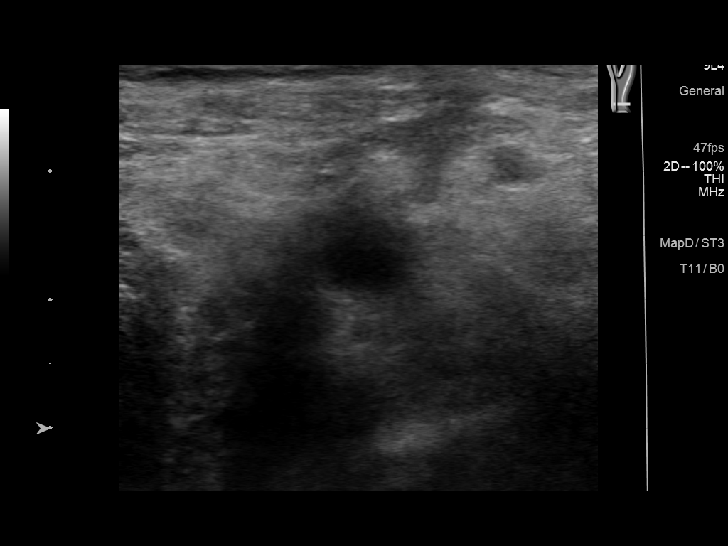
[im 39/64]
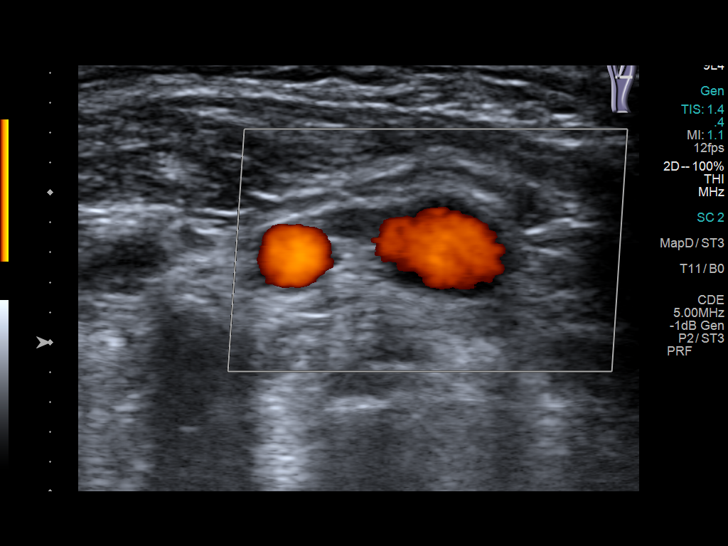
[im 44/64]
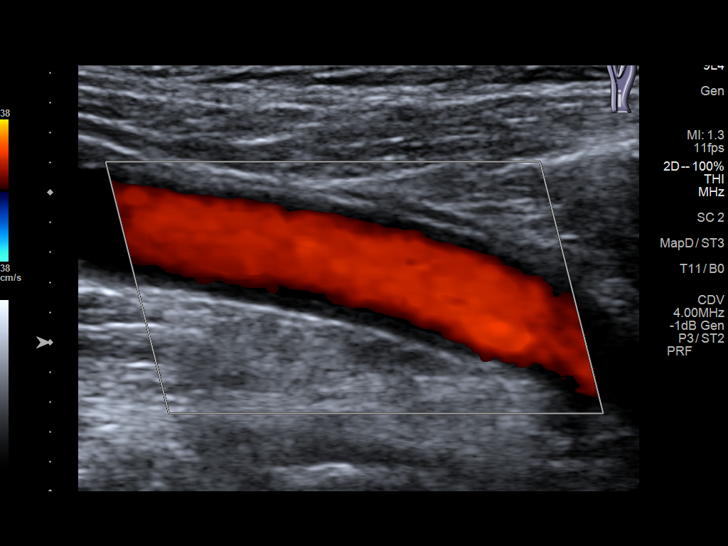
[im 50/64]
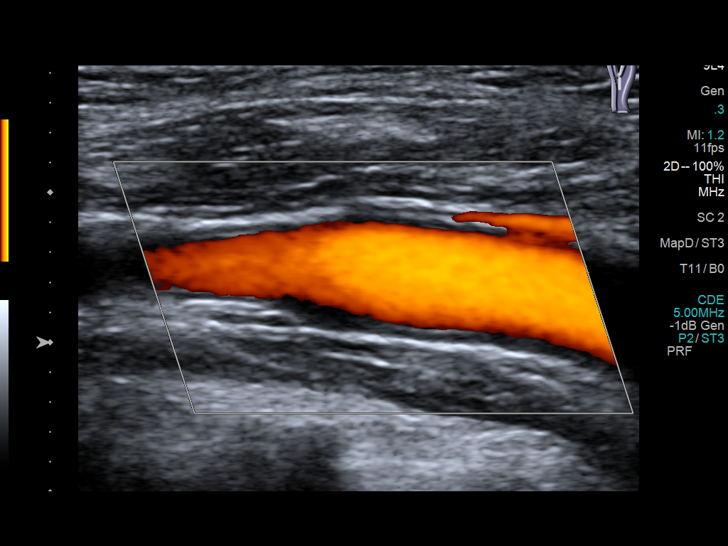
[im 53/64]
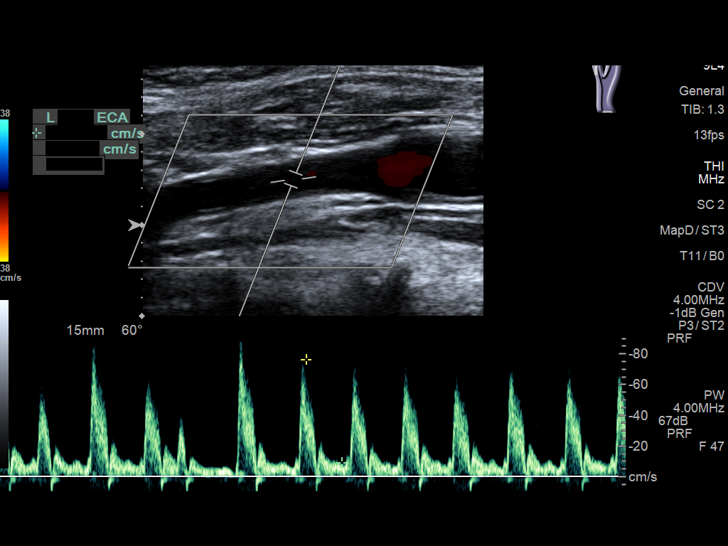
[im 58/64]
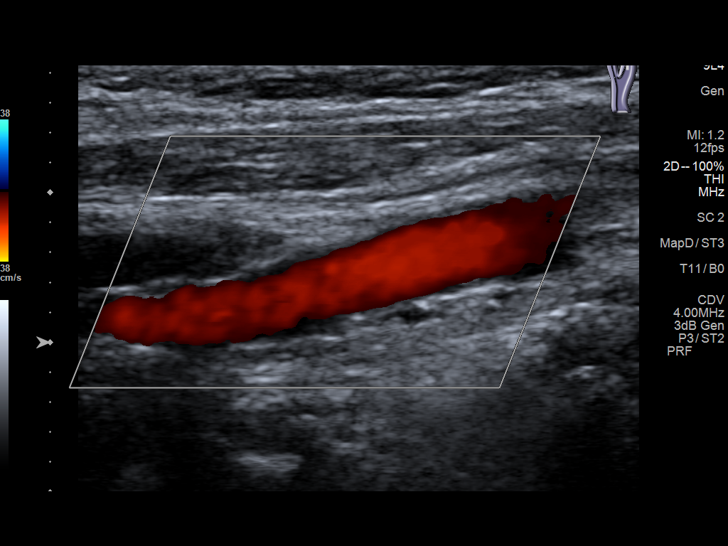
[im 64/64]
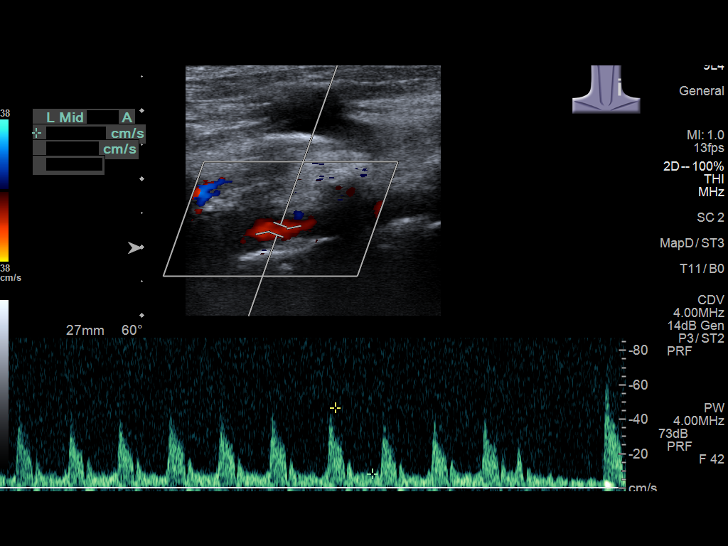

[14 of 24 positions shown; findings below may reference images not displayed]

FINDINGS: Criteria: Quantification of carotid stenosis is based on velocity
parameters that correlate the residual internal carotid diameter
with NASCET-based stenosis levels, using the diameter of the distal
internal carotid lumen as the denominator for stenosis measurement.

The following velocity measurements were obtained:

RIGHT

ICA: 100/30 cm/sec

CCA: 48/11 cm/sec

SYSTOLIC ICA/CCA RATIO:

ECA: 60 cm/sec

LEFT

ICA: 81/24 cm/sec

CCA: 57/15 cm/sec

SYSTOLIC ICA/CCA RATIO:

ECA: 76 cm/sec

RIGHT CAROTID ARTERY: Minimal atheromatous plaque of the distal
common carotid artery.

RIGHT VERTEBRAL ARTERY:  Antegrade flow.

LEFT CAROTID ARTERY:  No significant atheromatous plaque.

LEFT VERTEBRAL ARTERY:  Antegrade flow.
IMPRESSION: No significant stenosis of internal carotid arteries.
